# Patient Record
Sex: Male | Born: 1950 | Race: White | Hispanic: No | Marital: Married | Smoking: Former smoker
Health system: Southern US, Community
[De-identification: ages and names within clinical notes are randomized; demographics above are authoritative.]

## PROBLEM LIST (undated history)

## (undated) DIAGNOSIS — Z87442 Personal history of urinary calculi: Secondary | ICD-10-CM

## (undated) DIAGNOSIS — G4700 Insomnia, unspecified: Secondary | ICD-10-CM

## (undated) DIAGNOSIS — M549 Dorsalgia, unspecified: Secondary | ICD-10-CM

## (undated) DIAGNOSIS — I10 Essential (primary) hypertension: Secondary | ICD-10-CM

## (undated) DIAGNOSIS — C439 Malignant melanoma of skin, unspecified: Secondary | ICD-10-CM

## (undated) DIAGNOSIS — M1990 Unspecified osteoarthritis, unspecified site: Secondary | ICD-10-CM

## (undated) HISTORY — PX: EXCISION OF BACK LESION: SHX6597

## (undated) HISTORY — PX: MELANOMA EXCISION: SHX5266

## (undated) HISTORY — PX: KNEE ARTHROSCOPY: SUR90

---

## 1999-11-21 ENCOUNTER — Encounter: Payer: Self-pay | Admitting: *Deleted

## 1999-11-21 ENCOUNTER — Emergency Department (HOSPITAL_COMMUNITY): Admission: EM | Admit: 1999-11-21 | Discharge: 1999-11-21 | Payer: Self-pay | Admitting: Emergency Medicine

## 1999-11-21 ENCOUNTER — Encounter: Payer: Self-pay | Admitting: Emergency Medicine

## 1999-11-22 ENCOUNTER — Ambulatory Visit (HOSPITAL_COMMUNITY): Admission: RE | Admit: 1999-11-22 | Discharge: 1999-11-22 | Payer: Self-pay | Admitting: *Deleted

## 1999-11-22 ENCOUNTER — Encounter: Payer: Self-pay | Admitting: *Deleted

## 1999-11-24 ENCOUNTER — Ambulatory Visit (HOSPITAL_COMMUNITY): Admission: RE | Admit: 1999-11-24 | Discharge: 1999-11-24 | Payer: Self-pay | Admitting: *Deleted

## 1999-11-24 ENCOUNTER — Encounter: Payer: Self-pay | Admitting: *Deleted

## 2001-03-22 ENCOUNTER — Encounter: Admission: RE | Admit: 2001-03-22 | Discharge: 2001-03-22 | Payer: Self-pay | Admitting: Family Medicine

## 2001-03-22 ENCOUNTER — Encounter: Payer: Self-pay | Admitting: Family Medicine

## 2002-05-20 ENCOUNTER — Encounter: Admission: RE | Admit: 2002-05-20 | Discharge: 2002-05-20 | Payer: Self-pay | Admitting: Family Medicine

## 2002-05-20 ENCOUNTER — Encounter: Payer: Self-pay | Admitting: Family Medicine

## 2002-12-30 ENCOUNTER — Encounter: Admission: RE | Admit: 2002-12-30 | Discharge: 2002-12-30 | Payer: Self-pay | Admitting: Family Medicine

## 2005-01-09 ENCOUNTER — Encounter: Admission: RE | Admit: 2005-01-09 | Discharge: 2005-01-09 | Payer: Self-pay | Admitting: Family Medicine

## 2005-02-13 HISTORY — PX: TOTAL HIP ARTHROPLASTY: SHX124

## 2005-10-11 ENCOUNTER — Inpatient Hospital Stay (HOSPITAL_COMMUNITY): Admission: RE | Admit: 2005-10-11 | Discharge: 2005-10-14 | Payer: Self-pay | Admitting: Orthopedic Surgery

## 2007-03-22 ENCOUNTER — Encounter: Admission: RE | Admit: 2007-03-22 | Discharge: 2007-03-22 | Payer: Self-pay | Admitting: Family Medicine

## 2007-05-25 ENCOUNTER — Ambulatory Visit: Payer: Self-pay | Admitting: Internal Medicine

## 2007-05-25 ENCOUNTER — Observation Stay (HOSPITAL_COMMUNITY): Admission: EM | Admit: 2007-05-25 | Discharge: 2007-05-26 | Payer: Self-pay | Admitting: Emergency Medicine

## 2008-02-14 DIAGNOSIS — C439 Malignant melanoma of skin, unspecified: Secondary | ICD-10-CM

## 2008-02-14 HISTORY — DX: Malignant melanoma of skin, unspecified: C43.9

## 2010-06-28 NOTE — H&P (Signed)
Jon Choi, Jon Choi NO.:  [id number]   MEDICAL RECORD NO.:  [id number]          PATIENT TYPE:  INP   LOCATION:  1823                         FACILITY:  [facility]   PHYSICIAN:  Donalynn Furlong, MD      DATE OF BIRTH:  01/29/1951   DATE OF ADMISSION:  05/25/2007  DATE OF DISCHARGE:                              HISTORY & PHYSICAL   ADMITTING PHYSICIAN:  Hollice Espy, M.D.   PRIMARY CARE PHYSICIAN:  Donia Guiles, M.D. with [facility].   CHIEF COMPLAINT:  Chest pain, shortness of breath.   HISTORY OF PRESENT ILLNESS:  Jon Choi is a 60 year old male who  presented to [facility] ER tonight with the complaint of chest pain.  Chest pain is pressure like on the left side of the chest overlying the  heart.  It is persistent and it is present for 2 days.  Chest pressure  increases and decreases in intensity but it is there for 2 days.  Chest  pressure comes on at rest and on exertion both.  Chest pressure is  associated with mild shortness of breath also.  No associated nausea,  vomiting, perspiration, increased heart rate or feeling of death.  The  patient denies any history of coronary artery disease, gastroesophageal  reflux disease or gallstones in the past.  He denied any history of  pneumonia, sick contacts or clots in the lung also.  He does have some  premature coronary artery disease in the family history.  Chest pain  does not radiate to any other place.   PAST MEDICAL HISTORY:  Tobacco use, right total hip arthroplasty,  history of left-sided ureteral stone and knee surgery in the past.   PAST SURGICAL HISTORY:  As above in the medical history.   ALLERGIES:  None.   HOME MEDICATIONS:  He takes Tylenol for sleep at night.   REVIEW OF SYSTEMS:  Positive per HPI, otherwise negative.  Review of  systems done for 14 systems.   SOCIAL HISTORY:  The patient is married, lives with his wife, continues  to smoke cigarettes.  No recent alcohol or drug use.   FAMILY HISTORY:  Mother is old, around 66 years old.  Father died of  pulmonary embolism at a young age.  His brother had his first heart  attack at the age of 39.   PHYSICAL EXAMINATION:  VITAL SIGNS:  Blood pressure 147/98, pulse 71,  respirations 18, temperature 98.4, oxygen saturation 99%.  GENERAL:  Alert, oriented x3, lying in bed in mild distress.  CARDIOVASCULAR:  S1, S2 regular.  No murmur, rub, gallop.  LUNGS:  Clear to auscultation bilaterally.  No wheezing, rales or  crackles  ABDOMEN:  Nontender, nondistended.  Bowel sounds present.  EXTREMITIES:  No clubbing, cyanosis or edema.  No calf tenderness.  Pulses palpable in all 4 extremities.  HEAD:  Normocephalic, nontraumatic.  EYES:  Pupils equally reactive to light and accommodation.  NECK:  Normocephalic, atraumatic.  ORAL CAVITY:  Oral mucosa moist.  No thrush noted.  NECK:  No thyromegaly or JVD.  SKIN:  No rash or  bruits.  NEUROLOGICAL:  Shows intact cranial nerves, muscle strength and  sensation.   LAB:  Shows EKG with rate of 70, normal sinus rhythm, normal axis,  normal ST-T changes.  BNP less than 30, D-dimer 0.45, basic metabolic  panel unremarkable except glucose 147.  CBC unremarkable.  First set of  troponin negative and chest x-ray unremarkable for any acute lung  disease.   ASSESSMENT:  1. Chest pain, atypical in nature, associated with shortness of      breath.  Will rule out myocardial infarction and pulmonary embolus.  2. Hypertension, uncontrolled.  3. Family history of coronary artery disease.  4. Continuous tobacco use.  5. History of left ureteral stone.  6. History of right total hip arthroplasty.   PLAN:  Will admit the patient to telemetry bed with the diagnosis of  chest pain and shortness of breath.  The patient is full code.  The  patient will be n.p.o. after midnight for stress test and lipid profile  in the morning.  Will check CBC, CMP, EKG, TSH and fasting lipids in the  morning.  We  will get cardiac enzymes with troponin q.6 h. for 2 more  times.  Will give him aspirin 325 mg p.o. daily along with Lovenox 1  mg/kg subcutaneously q.12 h. until the MI rule out.  We will also start  him on metoprolol 25 mg p.o. b.i.d. for his hypertension.  He will also  get lisinopril 20 mg, simvastatin 20 mg and nitro patch 1 inch q.6 h.  for chest pain.  We will also start him on IV morphine and sublingual  nitro p.r.n. for pain.  We will also start him on IV Protonix for GI  prophylaxis.  He will also get Tylenol for any pain or fever p.r.n.  We  will get CT chest angiogram to rule out pulmonary embolus tonight.  We  will get exercise Cardiolite stress test in the morning after 3  troponins are negative.  Further plan according to the workup pending.      Donalynn Furlong, MD  Electronically Signed     TVP/MEDQ  D:  05/25/2007  T:  05/25/2007  Job:  [id number]   cc:   Donia Guiles, M.D.  Hollice Espy, M.D.

## 2010-06-28 NOTE — Consult Note (Signed)
Jon, Choi               ACCOUNT NO.:  [id number]   MEDICAL RECORD NO.:  [id number]          PATIENT TYPE:  INP   LOCATION:  3739                         FACILITY:  [facility]   PHYSICIAN:  Jake Bathe, MD      DATE OF BIRTH:  [birth year]   DATE OF CONSULTATION:  DATE OF DISCHARGE:  05/26/2007                                 CONSULTATION   REQUESTING PHYSICIAN:  Hollice Espy, M.D.   PRIMARY PHYSICIAN:  Donia Guiles, M.D.   REASON FOR CONSULTATION:  Mr. Lofgren is being seen at the request of Dr.  Rito Ehrlich for the evaluation of chest pain.   HISTORY OF PRESENT ILLNESS:  A 60 year old male with no prior cardiac  history with recent tobacco cessation 5 weeks ago after extensive  history as well as early family history of coronary artery disease with  his father having his first myocardial infarction at age 41 and his  brother in his 23s having stent placement, who 2 days ago, on Friday,  began having substernal chest pain/shortness of breath, mostly  categorized as short and strong, off and on x2 days.  It was mild and  sharp, would come and go with no radiation into his neck or arms.  He  had no associated nausea or diaphoresis.  Pain never quite went away and  has been there for approximately 2 days.  No significant change with  exertion.  No syncope.  No fevers.  He has had recent respiratory  illness/URI.  Currently, chest pain free prior to nuclear stress test.   PAST MEDICAL HISTORY:  1. Total hip replacement.  2. Knee surgery x2.  3. Nephrolithiasis, history of left-sided ureteral stones.   PAST SURGICAL HISTORY:  As above.   ALLERGIES:  No known drug allergies.   MEDICATIONS:  Occasional Tylenol PM p.r.n.   FAMILY HISTORY:  As above.  Father MI at age 57, brother with stent.   SOCIAL HISTORY:  Works as a Advice worker for [employer].  No alcohol, just  quit tobacco 5 weeks ago after extensive use.   REVIEW OF SYSTEMS:  Unless explained above, all other 12  review of  systems negative.   PHYSICAL EXAMINATION:  VITAL SIGNS:  Blood pressure currently 113/62,  pulse in the 60s, respirations 18, and afebrile.  GENERAL:  Alert and oriented x3 in no acute distress, pleasant.  EYES:  Well-perfused conjunctivae.  EOMI.  No scleral icterus.  NECK:  Supple.  No lymphadenopathy.  No carotid bruits.  No JVD.  CARDIOVASCULAR:  Regular rate and rhythm with no murmurs, rubs, or  gallops.  LUNGS:  Clear to auscultation bilaterally.  Normal respiratory effort.  ABDOMEN:  Soft, nontender, and normoactive bowel sounds.  No bruits.  EXTREMITIES:  No clubbing, cyanosis, or edema.  Normal distal pulses.  SKIN:  Warm, dry, and intact.  No rashes.  NEUROLOGIC:  Nonfocal.  Normal gait.   DATA:  EKG shows normal sinus rhythm rate 70 with no other changes.  When compared to prior ECG, there is no significant change.  CT of chest  was negative for  pulmonary embolism showed some mild reactive lymph  nodes in the hilar region.  Chest x-ray showed no acute airspace  disease.  D-dimer was 0.45.  BNP was 30.  Total cholesterol 157, HDL 15,  triglycerides 732, and LDL unable to calculate.  CK 328, MB 5.1, and  troponin 0.01.  All 3 sets were negative.  White count 7.6, hemoglobin  14, hematocrit 40, and platelets 292.  Potassium 3.6, creatinine 0.9,  and glucose 101.   ASSESSMENT AND PLAN:  A 60 year old male with recent tobacco abuse,  strong family history of coronary artery disease early with  hypertriglyceridemia and low HDL with atypical chest pain.  1. Atypical chest pain - await results of imaging for nuclear stress      test.  He exercised 8 minutes 30 seconds on the exercise treadmill      with no ST deviations, no symptoms, he did well.  Likely,      noncardiac etiology.  However, given his multiple risk factors, I      agree with the stress test for further risk stratification.  2. Hypertriglyceridemia/low HDL - I have given him a prescription for       Niaspan 500 mg once a day to be uptitrated as an outpatient.  Liver      enzymes were within normal limits.  I have also told him to start      taking fish oil 1000 mg twice daily to lower triglycerides.  Unable      to calculate LDL currently, because of elevated triglycerides.  We      will see as outpatient and titrate meds accordingly.  3. Prevention - we will start on aspirin 81 mg once a day.  4. Tobacco cessation - congratulated him on 5 weeks of no smoking.      When he arrived in the emergency department, his blood pressure was      147/98, then 143/76.  He was placed on low-dose lisinopril 5 mg as      well as metoprolol 25 mg twice daily.  One may wish to continue      these medicines as seems fit.  We will see as outpatient in 2      weeks.  We will follow up with the results of imaging stress test.      Jake Bathe, MD  Electronically Signed     MCS/MEDQ  D:  05/26/2007  T:  05/26/2007  Job:  [id number]   cc:   Hollice Espy, M.D.

## 2010-07-01 NOTE — Op Note (Signed)
[facility]  Patient:    Jon Choi, Jon Choi                       MRN: [id number] Proc. Date: 11/22/99 Attending:  Radene Knee., M.D.                           Operative Report  PREOPERATIVE DIAGNOSES:  Left ureteral calculus, L3 with hydronephrosis.  POSTOPERATIVE DIAGNOSES:  Left ureteral calculus, L3 with hydronephrosis.  OPERATION PERFORMED:  Cystoscopy, insertion of left ureteral stent in preparation for a shock wave lithotripsy.  DESCRIPTION OF PROCEDURE:  This patient was brought to the operating room after receiving IV gentamycin and underwent successful induction of general anesthesia, prepped and draped in the lithotomy position. The urethra accepted a #22 cystourethroscope. The bladder was inspected with 70 and 12 degree lenses. There was no stone, tumor, nor ulcer in the bladder. The right and left ureteral orifices were normal. There was slight elevation of the bladder neck by median lip, no lateral lobe hypertrophy. The left ureteral orifice accepted an 0.38 Glidewire which passed to the region of the left renal pelvis under fluoroscopic control. The Glidewire was used to introduce a #5 open ended catheter and dye was injected which showed grade 1 hydronephrosis and looked like the stone had probably been pushed back into the renal pelvis from the upper ureter. The Glidewire was replaced and over the Glidewire was passed a #6 multi-length kwart type double J ureteral stone was noted to have a good curl in the renal pelvis and a good curl in the bladder on fluoroscopy. The stent string was taped to the penis. The patient returned to the recovery area in a stable condition.  The plan is for the patient to have Mepergan Fortis for pain, forced fluids, cephalexin 500 t.i.d. for his pain. Cephalexin t.i.d. for prophylaxis against infection and will schedule him November 24, 1999 for shock wave lithotripsy. DD:  11/22/99 TD:   11/23/99 Job: [id number]

## 2010-07-01 NOTE — H&P (Signed)
[facility]  Patient:    Jon Choi, Jon Choi                       MRN: [id number] Adm. Date:  [id number] Disc. Date: [id number] Attending:  Molpus, John L                         History and Physical  DATE OF BIRTH:  Mar 29, 1950.  CHIEF COMPLAINT:  This patient, age 60, was initially seen in our office on November 21, 1999, referred from the [facility] Emergency Room with a CT scan that suggested at 5 or 6 mm calculus, upper ureter, on the left, with a chief complaint of pain in the left side.  HISTORY OF PRESENT ILLNESS:  This patient states that he had his first pain November 20, 1999, which was severe in the left side associated with vomiting and presented in the emergency room, where CT scan suggested a stone obstructing the upper left ureter.  He reported no nocturia.  He voids every couple of hours.  He has not passed gross blood, gravel, or stone to his knowledge.  He did notice dark urine about two weeks ago.  He has not had fever, but he has had vomiting.  ALLERGIES:  None.  MEDICATIONS:  Aspirin.  He was given Percodan in the emergency room.  He used also Percocet, Demerol, and _____.  PAST MEDICAL HISTORY:  Unremarkable.  PAST SURGICAL HISTORY:  He had knee surgery on one side in 1988, on the other side in 1983.  No other surgeries.  REVIEW OF SYSTEMS:  Weight has been steady, about 200.  HEENT:  Unremarkable. Wears glasses.  CARDIORESPIRATORY:  He denies any chest pain, heart attack, shortness of breath.  GASTROINTESTINAL:  No peptic ulcer disease, no hepatitis, bowels move normally.  He has indigestion, for which he uses Tums. BONES, JOINTS, MUSCLES:  Has no arthritis.  NEUROPSYCHIATRIC:  Denies any stroke, fainting, falling-out spells.  FAMILY HISTORY:  Father died at age 76 of a pulmonary embolus from a clot in his leg, and his mother is living at age 17 in reasonably good health.  He has two siblings.  One brother has had a myocardial  infarction at age 77.  The sister is living and well at age 39.  To his knowledge, other than heart disease there are no familial diseases, especially no stones, diabetes, or bleeders.  SOCIAL HISTORY:  The patient is single, working.  Has two children.  Smokes one pack of cigarettes daily.  Does not use alcohol.  PHYSICAL EXAMINATION:  GENERAL:  A well-developed, well-nourished 60 year old male.  VITAL SIGNS:  Temperature is 98, pulse is 83, the respirations 16, blood pressure 136/83.  HEENT:  Ears:  Tympanic membranes unremarkable.  Eyes react normally to light and accommodation.  Extraocular movements intact.  Pharynx benign.  Teeth in good repair.  NECK:  No enlargement of thyroid or nodes palpable.  CHEST:  Clear to percussion and auscultation.  HEART:  Normal sinus rhythm, no murmur, not enlarged.  ABDOMEN:  There is a little left flank tenderness but no masses palpable.  GENITOURINARY:  The penis is circumcised.  The testes are symmetrical, good size.  Rectal tone is good.  Prostate is 15-20 g, symmetrical, firm, bifid, smooth, nontender.  EXTREMITIES:  No edema.  Good peripheral pulses.  NEUROLOGIC:  Grossly normal reflexes and sensation.  IMPRESSION: 1. Left upper ureter ureteral calculus. 2.  Left hydronephrosis.  PLAN: 1. IVP, cystoscopy, stent, on November 22, 1999. 2. ESWL on November 24, 1999. DD:  11/21/99 TD:  11/22/99 Job: [id number]

## 2010-07-01 NOTE — Discharge Summary (Signed)
Jon Choi, Jon Choi               ACCOUNT NO.:  [id number]   MEDICAL RECORD NO.:  [id number]          PATIENT TYPE:  INP   LOCATION:  1511                         FACILITY:  [facility]   PHYSICIAN:  Georges Lynch. Gioffre, M.D.DATE OF BIRTH:  Sep 20, 1950   DATE OF ADMISSION:  10/11/2005  DATE OF DISCHARGE:  10/14/2005                                 DISCHARGE SUMMARY   ADMISSION DIAGNOSES:  1. End-state osteoarthritis, right hip, bone on bone.  2. History of kidney stones.  3. History of tobacco use.   DISCHARGE DIAGNOSES:  1. Right total hip arthroplasty.  2. History of kidney stones.  3. History of tobacco use.   HISTORY OF PRESENT ILLNESS:  The patient is a 59 year old gentleman with  problems with his right hip now for in excess of a year.  Evaluation found  that he had severe osteoarthritis with complete loss of joint space and  femoral head.  The patient has failed conservative treatments and elected to  proceed with total hip replacement.   ALLERGIES:  No known drug allergies.   MEDICATIONS:  1. Keflex 500 mg four times a day for recent surgery due to a crown.  2. Vicodin p.r.n.   PROCEDURE:  On October 11, 2005, the patient was taken to the OR by Dr.  Ranee Gosselin, assisted by Dr. Durene Romans.  Under general anesthesia, the  patient underwent a right total hip arthroplasty with a metal on metal  component.  The patient had the femoral head sent to pathology for  evaluation.  The patient had estimated blood loss of 225 mL.  There were no  complications.  The patient had the following components implanted:  A size  6, high-offset tapered stem Duoflex; a size 49 ASR unifemoral implant; a  size +2 ASR tapered sleeve adapter; a size 56 ASR cup.  The patient  tolerated the procedure well.  He was transferred to the recovery room and  then to orthopedic floor in good condition following routine total hip  protocol.   CONSULTATIONS:  1. Physical therapy.  2. Occupational  therapy.  3. Case management.  4. Pharmacy for DVT prophylaxis.   HOSPITAL COURSE:  On October 11, 2005, the patient was admitted to [facility] under the care of Dr. Windy Fast A. Gioffre.  The patient was  taken to the OR where a right total hip arthroplasty was performed without  any complications.  The patient tolerated the procedure well and was  transferred to the recovery room and then to the orthopedic floor on IV pain  medicines and antibiotics and was started on Coumadin and heparin for DVT  prophylaxis and total hip protocol.  The patient then incurred a total of 3  days postoperative course on the orthopedic floor which the patient was able  to transition from IV medications to p.o. medications well.  The patient's  vital signs remained stable.  He remained afebrile.  His hemoglobin and  hematocrit remained fairly stable.  The patient's wound remained benign for  any signs of infection.  His leg remained neurovascularly intact.  The  patient had good pain control.  The patient worked well with physical  therapy.  The patient was felt to be both medically and orthopedically  stable on postop day #3 for discharge home for routine outpatient protocol  for total hip.  Arrangements were made and he was discharged home in good  condition.   LABORATORY DATA AND X-RAY FINDINGS:  CBC on September 1, showed WBC 11.0,  hemoglobin 11.2, hematocrit 31.8, platelets 219.  INR on September 1, was  2.0 on Coumadin.  Routine chemistries on September 1, showed sodium 134,  potassium 3.4, glucose 117, BUN 6, creatinine 0.87.  These slightly  abnormalities were felt to be related to the patient's inactivity, diet and  IV hydration status and were allowed to self-correct without any further  treatments.  The patient's EKG on admission was normal sinus rhythm at 67  beats per minute.  Chest x-ray on admission showed no active cardiopulmonary  process demonstrated.  Postop film of the hip  shows a well-seeded right hip  prosthesis.   DISCHARGE MEDICATIONS:  1. Routine Coumadin protocol being managed by pharmacy.  2. Heparin 5000 units subcu q.12h. until therapeutic and it was      discontinued on the date of discharge.  3. Colace 100 mg p.o. b.i.d.  4. Ferrous sulfate 325 mg p.o. t.i.d.  5. Nicotine 21 mg per 24 hour patch.  6. Percocet one to two tablets every 4-6h. p.r.n. pain.  7. Reglan 10 mg p.o. q.8h. p.r.n.  8. Phenergan 25 mg p.o. q.8h. p.r.n.  9. Robaxin 500 mg p.o. q.6h. p.r.n.  10.Ambien 10 mg p.o. nightly p.r.n.   DIET:  No restrictions.   ACTIVITY:  The patient is to ambulate with assistance and closely follow  total hip protocol with the use of a walker.   WOUND CARE:  The patient is to change dressing daily.   SPECIAL INSTRUCTIONS:  Continue routine home medications with the exception  of discontinuing the Vicodin and use Percocet as written.  Also, to start  Coumadin 5 mg tablets take one-half tablet daily unless changed by home  health pharmacy.   FOLLOW UP:  The patient is to have a follow-up appointment with Dr. Darrelyn Hillock  in his office 2 weeks from discharge.  [facility] home health physical therapy  and RN to provide service for total hip protocol.   CONDITION ON DISCHARGE:  Improved and good.      Jamelle Rushing, P.A.    ______________________________  Georges Lynch Darrelyn Hillock, M.D.    RWK/MEDQ  D:  11/01/2005  T:  11/02/2005  Job:  [id number]

## 2010-07-01 NOTE — H&P (Signed)
NAMEJEMEL, ONO               ACCOUNT NO.:  [id number]   MEDICAL RECORD NO.:  [id number]          PATIENT TYPE:   LOCATION:                                 FACILITY:   PHYSICIAN:  Jon Choi. Gioffre, M.D.DATE OF BIRTH:  07/19/1950   DATE OF ADMISSION:  10/11/2005  DATE OF DISCHARGE:                                HISTORY & PHYSICAL   CHIEF COMPLAINT:  Right hip pain.   HISTORY OF PRESENT ILLNESS:  Jon Choi is a 60 year old gentleman who has  been having problems with his right hip now for the last year or so.  He had  fallen previously in December 2006.  He did have some arthritic changes at  that time with loss of joint space and cystic changes.  He has been  tolerating it up to this point but presently early August evaluation found  that he had severe constant pain with loss or range of motion of his right  hip and would like to discuss surgical treatment.  He does have severe  degenerative changes of the hip with loss of joint space, and the patient  would like to proceed with a total hip arthroplasty.  He does have pain with  ambulation, range of motion of the hip.   ALLERGIES:  NO KNOWN DRUG ALLERGIES.   CURRENT MEDICATIONS:  1. Keflex 500 q.i.d. due to recent oral surgery with a crown.  2. Vicodin p.r.n.   PREMEDICATION:  1. Current medical issues include a childhood heart murmur with no      recurrent issues.  2. History of kidney stones, last being 3 or 4 years ago.  3. He does have bilateral knee open meniscal repairs in 1984.  4. Current treatment with oral crown work.   PAST SURGICAL HISTORY:  Bilateral knees in the 70s without any  complications.   SOCIAL HISTORY:  Patient is married, lives with his wife, is currently  unemployed.  Smokes about one pack a day. Currently lives in a one-story  house with his wife.   FAMILY MEDICAL HISTORY:  Mother is 54 years old on oxygen.  Father is  deceased many years ago from a blood clot.  Older brother does have  coronary  artery disease with a stent.   PRIMARY CARE PHYSICIAN:  Dr. Arvilla Market.   REVIEW:  Unremarkable for any cardiac, respiratory, GI, GU, hematologic or  neurologic issues.   PHYSICAL EXAMINATION:  VITALS:  Height is 62.  Weight is 197 pounds.  Blood  pressure is 118/80, pulse of 74 and regular, respirations of 12.  The  patient is afebrile.  GENERAL:  This is a healthy-appearing, slim, physically-fit-appearing  gentleman conscious, alert and appropriate.  Ambulates with a right-sided  limp.  Easily gets himself on and off the exam table.  HEENT:  Head was normocephalic.  Pupils equal, round and reactive.  Oral  buccal mucosa without any signs of infection.  NECK:  Supple.  No palpable lymphadenopathy.  Good range of motion.  CHEST:  Lung sounds were clear and equal bilaterally.  No wheezes, rales or  rhonchi.  HEART:  Regular rate and rhythm.  No murmurs, rubs or gallops.  ABDOMEN:  Soft, nontender.  Bowel sounds present.  No CVA region tenderness.  EXTREMITIES:  Upper extremities were symmetrically sized and shaped.  He had  excellent range of motion of shoulders, elbows, and wrist. Motor strength  was 5/5.  Lower extremities:  Right hip had full extension, flexion up to  about 100 degrees, had about 5 degrees of internal and external rotation due  to mechanical blocking and discomfort.  Left hip had full extension with  flexion up to 120 degrees with 20 to 30 degrees internal-external rotation  with discomfort bilateral knees without any signs of erythema or ecchymosis.  He had well-healed anterior incisions, full extension, flexion back to 100  degrees easily without any discomfort or instability.  Calves were soft and  nontender.  The ankles were symmetrical with good dorsi plantar flexion.  NEUROLOGIC:  The patient had no deficits.  BREAST, RECTAL AND GENITOURINARY:  Exams were deferred.   IMPRESSION:  1. 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  2. History  of cardiac murmur as a child.  3. History of kidney stones  4. Recent oral surgery with crown on antibiotics prophylactically.   PLAN:  The patient will be scheduled for a right total hip arthroplasty by  Dr. Darrelyn Hillock on October 11, 2005.  The patient will undergo all routine labs  and tests prior to this procedure.      Jon Choi, P.A.    ______________________________  Jon Choi Darrelyn Hillock, M.D.    RWK/MEDQ  D:  10/02/2005  T:  10/02/2005  Job:  [id number]   cc:   Windy Fast A. Darrelyn Hillock, M.D.  Fax: [phone]

## 2010-07-01 NOTE — Op Note (Signed)
NAMEMACKAY, Jon Choi               ACCOUNT NO.:  [id number]   MEDICAL RECORD NO.:  [id number]          PATIENT TYPE:  INP   LOCATION:  0003                         FACILITY:  [facility]   PHYSICIAN:  Georges Lynch. Gioffre, M.D.DATE OF BIRTH:  December 20, 1950   DATE OF PROCEDURE:  10/11/2005  DATE OF DISCHARGE:                                 OPERATIVE REPORT   SURGEON:  Georges Lynch. Darrelyn Hillock, M.D.   ASSISTANT:  Madlyn Frankel. Charlann Boxer, M.D.   PREOP DIAGNOSIS:  Severe degenerative arthritis right hip.   POSTOP DIAGNOSIS:  Severe degenerative arthritis right hip.   OPERATION:  Right total hip arthroplasty.  I utilized the [employer] system.  We  used an ASR acetabular cup size 56 mm diameter.  There was Porocoated and  hydroxyapatite coated.  The stem was a size 6 high offset tapered stem.  The  femoral head was a size 49.  We used a +2 tapered sleeve adapter.   DESCRIPTION OF PROCEDURE:  Under general anesthesia with the patient on the  left side and the right side up, routine orthopedic prep and draping of the  right hip was carried out.  He had 1 gram of IV Ancef preop.  A  posterolateral approach to the hip was carried out.  Bleeders were  identified and cauterized.  At this time, self-retaining retractors were  inserted.  I separated the iliotibial band by sharp dissection; and then  separated the gluteal muscle by blunt dissection with great care taken not  to injure the long sciatic nerve.  The appropriate bleeders were cauterized.  At this time, I detached the capsule from the femoral head and did a  capsulectomy.  I then dislocated the head; and then amputated the head at  the appropriate neck length.  I then utilized a box cut to cut out my  femoral neck.  I then utilized the lateralizing reamers.   I then reamed and rasped the femoral shaft up to a size 6 femoral component.  Following that, we then utilized a calcar planer to even out the femoral  neck.  We then directed attention to the acetabulum.   I completed my  capsulectomy.  I then reamed the acetabulum up to a size 55 mm for a 56 mm  cup.  We then inserted our permanent ASR cup.  We had good fixation.  I then  went through trials; and, again, we finally selected a +2 C-tapered head  with a 49 ball.  The femoral component and the permanent femoral component  was inserted.  I then inserted my permanent +2 sleeve adapter and then the  size 49 mm metal ball.  So we had metal-on-metal total hip.  I thoroughly  irrigated out the acetabulum to make sure that all the soft tissue was out  of harms way, then reduced hip, took the hip through motion.  We had  excellent stability.  Note at all times during the trial and post trial, we  checked our leg lengths to make sure we had appropriate leg length  measurements.  I then inserted a Hemovac drain; and  then closed the wound in  layers in the usual fashion.  A sterile Neosporin dressing was applied. The  patient left the operating room in satisfactory condition.           ______________________________  Georges Lynch Darrelyn Hillock, M.D.    RAG/MEDQ  D:  10/11/2005  T:  10/12/2005  Job:  [id number]

## 2010-11-08 LAB — URINALYSIS, ROUTINE W REFLEX MICROSCOPIC
Bilirubin Urine: NEGATIVE
Glucose, UA: NEGATIVE
Hgb urine dipstick: NEGATIVE
Ketones, ur: NEGATIVE
Nitrite: NEGATIVE
Protein, ur: NEGATIVE
Specific Gravity, Urine: 1.006
Urobilinogen, UA: 0.2
pH: 7.5

## 2010-11-08 LAB — CBC
HCT: 41.4
Hemoglobin: 14.4
MCHC: 34.7
MCHC: 35.4
MCV: 94.9
MCV: 95
Platelets: 292
RBC: 4.36
RDW: 12.3

## 2010-11-08 LAB — COMPREHENSIVE METABOLIC PANEL
AST: 32
Albumin: 3.8
BUN: 9
CO2: 21
Calcium: 8.8
Chloride: 109
Creatinine, Ser: 0.94
GFR calc Af Amer: >60
GFR calc non Af Amer: >60
Total Bilirubin: 0.4

## 2010-11-08 LAB — CARDIAC PANEL(CRET KIN+CKTOT+MB+TROPI)
CK, MB: 5.1 — ABNORMAL HIGH
CK, MB: 5.3 — ABNORMAL HIGH
Relative Index: 1.6
Total CK: 314 — ABNORMAL HIGH
Total CK: 328 — ABNORMAL HIGH
Troponin I: 0.01
Troponin I: 0.01

## 2010-11-08 LAB — POCT CARDIAC MARKERS
CKMB, poc: 3.2
Myoglobin, poc: 182
Operator id: 196461
Troponin i, poc: <0.05

## 2010-11-08 LAB — BASIC METABOLIC PANEL
CO2: 22
Calcium: 8.8
Chloride: 106
GFR calc Af Amer: >60
Glucose, Bld: 147 — ABNORMAL HIGH
Potassium: 3.8
Sodium: 137

## 2010-11-08 LAB — CK TOTAL AND CKMB (NOT AT ARMC)
Relative Index: 1.7
Total CK: 324 — ABNORMAL HIGH

## 2010-11-08 LAB — TSH: TSH: 3.841

## 2010-11-08 LAB — LIPID PANEL: Cholesterol: 157

## 2013-07-01 DIAGNOSIS — M25561 Pain in right knee: Secondary | ICD-10-CM | POA: Insufficient documentation

## 2013-07-01 DIAGNOSIS — M25569 Pain in unspecified knee: Secondary | ICD-10-CM | POA: Insufficient documentation

## 2015-05-31 ENCOUNTER — Other Ambulatory Visit: Payer: Self-pay | Admitting: Orthopaedic Surgery

## 2015-05-31 DIAGNOSIS — M545 Low back pain: Secondary | ICD-10-CM | POA: Diagnosis not present

## 2015-05-31 DIAGNOSIS — M25551 Pain in right hip: Secondary | ICD-10-CM | POA: Diagnosis not present

## 2015-06-08 ENCOUNTER — Ambulatory Visit
Admission: RE | Admit: 2015-06-08 | Discharge: 2015-06-08 | Disposition: A | Discharge status: Home / Self Care | Payer: BLUE CROSS/BLUE SHIELD | Source: Ambulatory Visit | Attending: Orthopaedic Surgery | Admitting: Orthopaedic Surgery

## 2015-06-08 DIAGNOSIS — M545 Low back pain: Secondary | ICD-10-CM

## 2015-06-08 DIAGNOSIS — M4806 Spinal stenosis, lumbar region: Secondary | ICD-10-CM | POA: Diagnosis not present

## 2015-06-16 DIAGNOSIS — M545 Low back pain: Secondary | ICD-10-CM | POA: Diagnosis not present

## 2015-06-17 ENCOUNTER — Other Ambulatory Visit: Payer: Self-pay | Admitting: Orthopaedic Surgery

## 2015-06-17 DIAGNOSIS — M545 Low back pain: Principal | ICD-10-CM

## 2015-06-17 DIAGNOSIS — G8929 Other chronic pain: Secondary | ICD-10-CM

## 2015-06-22 ENCOUNTER — Other Ambulatory Visit: Payer: Self-pay

## 2015-06-22 ENCOUNTER — Ambulatory Visit
Admission: RE | Admit: 2015-06-22 | Discharge: 2015-06-22 | Disposition: A | Discharge status: Home / Self Care | Payer: BLUE CROSS/BLUE SHIELD | Source: Ambulatory Visit | Attending: Orthopaedic Surgery | Admitting: Orthopaedic Surgery

## 2015-06-22 DIAGNOSIS — M4806 Spinal stenosis, lumbar region: Secondary | ICD-10-CM | POA: Diagnosis not present

## 2015-06-22 DIAGNOSIS — E785 Hyperlipidemia, unspecified: Secondary | ICD-10-CM | POA: Insufficient documentation

## 2015-06-22 DIAGNOSIS — I10 Essential (primary) hypertension: Secondary | ICD-10-CM | POA: Insufficient documentation

## 2015-06-22 DIAGNOSIS — C439 Malignant melanoma of skin, unspecified: Secondary | ICD-10-CM | POA: Insufficient documentation

## 2015-06-22 DIAGNOSIS — G8929 Other chronic pain: Secondary | ICD-10-CM

## 2015-06-22 DIAGNOSIS — M545 Low back pain: Principal | ICD-10-CM

## 2015-06-22 MED ORDER — IOPAMIDOL (ISOVUE-M 200) INJECTION 41%
1.0000 mL | Freq: Once | INTRAMUSCULAR | Status: AC
  Administered 2015-06-22: 1 mL via EPIDURAL

## 2015-06-22 MED ORDER — METHYLPREDNISOLONE ACETATE 40 MG/ML INJ SUSP (RADIOLOG
120.0000 mg | Freq: Once | INTRAMUSCULAR | Status: AC
  Administered 2015-06-22: 120 mg via EPIDURAL

## 2015-06-22 NOTE — Discharge Instructions (Signed)

## 2015-07-19 DIAGNOSIS — M545 Low back pain: Secondary | ICD-10-CM | POA: Diagnosis not present

## 2015-10-20 ENCOUNTER — Other Ambulatory Visit: Payer: Self-pay | Admitting: Orthopaedic Surgery

## 2015-10-20 DIAGNOSIS — G8929 Other chronic pain: Secondary | ICD-10-CM

## 2015-10-20 DIAGNOSIS — M545 Low back pain: Principal | ICD-10-CM

## 2015-10-28 ENCOUNTER — Ambulatory Visit
Admission: RE | Admit: 2015-10-28 | Discharge: 2015-10-28 | Disposition: A | Discharge status: Home / Self Care | Payer: BLUE CROSS/BLUE SHIELD | Source: Ambulatory Visit | Attending: Orthopaedic Surgery | Admitting: Orthopaedic Surgery

## 2015-10-28 DIAGNOSIS — G8929 Other chronic pain: Secondary | ICD-10-CM

## 2015-10-28 DIAGNOSIS — M5126 Other intervertebral disc displacement, lumbar region: Secondary | ICD-10-CM | POA: Diagnosis not present

## 2015-10-28 DIAGNOSIS — M545 Low back pain: Principal | ICD-10-CM

## 2015-10-28 MED ORDER — IOPAMIDOL (ISOVUE-M 200) INJECTION 41%
1.0000 mL | Freq: Once | INTRAMUSCULAR | Status: AC
  Administered 2015-10-28: 1 mL via EPIDURAL

## 2015-10-28 MED ORDER — METHYLPREDNISOLONE ACETATE 40 MG/ML INJ SUSP (RADIOLOG
120.0000 mg | Freq: Once | INTRAMUSCULAR | Status: AC
  Administered 2015-10-28: 120 mg via EPIDURAL

## 2015-11-16 DIAGNOSIS — Z23 Encounter for immunization: Secondary | ICD-10-CM | POA: Diagnosis not present

## 2015-11-16 DIAGNOSIS — Z0000 Encounter for general adult medical examination without abnormal findings: Secondary | ICD-10-CM | POA: Diagnosis not present

## 2015-11-16 DIAGNOSIS — Z125 Encounter for screening for malignant neoplasm of prostate: Secondary | ICD-10-CM | POA: Diagnosis not present

## 2015-11-16 DIAGNOSIS — E782 Mixed hyperlipidemia: Secondary | ICD-10-CM | POA: Diagnosis not present

## 2015-11-16 DIAGNOSIS — I10 Essential (primary) hypertension: Secondary | ICD-10-CM | POA: Diagnosis not present

## 2015-11-16 DIAGNOSIS — N529 Male erectile dysfunction, unspecified: Secondary | ICD-10-CM | POA: Diagnosis not present

## 2015-11-16 DIAGNOSIS — Z1211 Encounter for screening for malignant neoplasm of colon: Secondary | ICD-10-CM | POA: Diagnosis not present

## 2015-11-23 ENCOUNTER — Other Ambulatory Visit (INDEPENDENT_AMBULATORY_CARE_PROVIDER_SITE_OTHER): Payer: Self-pay | Admitting: Orthopaedic Surgery

## 2015-11-23 DIAGNOSIS — G8929 Other chronic pain: Secondary | ICD-10-CM

## 2015-11-23 DIAGNOSIS — M545 Low back pain: Principal | ICD-10-CM

## 2015-12-08 ENCOUNTER — Other Ambulatory Visit: Payer: Self-pay | Admitting: Neurosurgery

## 2015-12-15 ENCOUNTER — Other Ambulatory Visit: Payer: BLUE CROSS/BLUE SHIELD

## 2015-12-15 DIAGNOSIS — L821 Other seborrheic keratosis: Secondary | ICD-10-CM | POA: Diagnosis not present

## 2015-12-15 DIAGNOSIS — Z85828 Personal history of other malignant neoplasm of skin: Secondary | ICD-10-CM | POA: Diagnosis not present

## 2015-12-15 DIAGNOSIS — D0471 Carcinoma in situ of skin of right lower limb, including hip: Secondary | ICD-10-CM | POA: Diagnosis not present

## 2015-12-15 DIAGNOSIS — C44712 Basal cell carcinoma of skin of right lower limb, including hip: Secondary | ICD-10-CM | POA: Diagnosis not present

## 2015-12-15 DIAGNOSIS — D1801 Hemangioma of skin and subcutaneous tissue: Secondary | ICD-10-CM | POA: Diagnosis not present

## 2015-12-15 DIAGNOSIS — L570 Actinic keratosis: Secondary | ICD-10-CM | POA: Diagnosis not present

## 2015-12-15 DIAGNOSIS — C4441 Basal cell carcinoma of skin of scalp and neck: Secondary | ICD-10-CM | POA: Diagnosis not present

## 2016-02-02 DIAGNOSIS — K648 Other hemorrhoids: Secondary | ICD-10-CM | POA: Diagnosis not present

## 2016-02-02 DIAGNOSIS — Z1211 Encounter for screening for malignant neoplasm of colon: Secondary | ICD-10-CM | POA: Diagnosis not present

## 2016-02-04 ENCOUNTER — Ambulatory Visit
Admission: RE | Admit: 2016-02-04 | Discharge: 2016-02-04 | Disposition: A | Discharge status: Home / Self Care | Payer: BLUE CROSS/BLUE SHIELD | Source: Ambulatory Visit | Attending: Orthopaedic Surgery | Admitting: Orthopaedic Surgery

## 2016-02-04 DIAGNOSIS — M545 Low back pain: Secondary | ICD-10-CM | POA: Diagnosis not present

## 2016-02-04 DIAGNOSIS — G8929 Other chronic pain: Secondary | ICD-10-CM

## 2016-02-04 MED ORDER — METHYLPREDNISOLONE ACETATE 40 MG/ML INJ SUSP (RADIOLOG
120.0000 mg | Freq: Once | INTRAMUSCULAR | Status: AC
  Administered 2016-02-04: 120 mg via EPIDURAL

## 2016-02-04 MED ORDER — IOPAMIDOL (ISOVUE-M 200) INJECTION 41%
1.0000 mL | Freq: Once | INTRAMUSCULAR | Status: AC
  Administered 2016-02-04: 1 mL via EPIDURAL

## 2016-02-04 NOTE — Discharge Instructions (Signed)

## 2016-02-14 DIAGNOSIS — B958 Unspecified staphylococcus as the cause of diseases classified elsewhere: Secondary | ICD-10-CM

## 2016-02-14 HISTORY — DX: Unspecified staphylococcus as the cause of diseases classified elsewhere: B95.8

## 2016-05-11 ENCOUNTER — Other Ambulatory Visit (INDEPENDENT_AMBULATORY_CARE_PROVIDER_SITE_OTHER): Payer: Self-pay | Admitting: Orthopaedic Surgery

## 2016-05-11 DIAGNOSIS — G8929 Other chronic pain: Secondary | ICD-10-CM

## 2016-05-11 DIAGNOSIS — M545 Low back pain: Principal | ICD-10-CM

## 2016-05-19 ENCOUNTER — Ambulatory Visit
Admission: RE | Admit: 2016-05-19 | Discharge: 2016-05-19 | Disposition: A | Discharge status: Home / Self Care | Payer: BLUE CROSS/BLUE SHIELD | Source: Ambulatory Visit | Attending: Orthopaedic Surgery | Admitting: Orthopaedic Surgery

## 2016-05-19 DIAGNOSIS — M545 Low back pain: Principal | ICD-10-CM

## 2016-05-19 DIAGNOSIS — G8929 Other chronic pain: Secondary | ICD-10-CM

## 2016-05-19 MED ORDER — METHYLPREDNISOLONE ACETATE 40 MG/ML INJ SUSP (RADIOLOG
120.0000 mg | Freq: Once | INTRAMUSCULAR | Status: AC
  Administered 2016-05-19: 120 mg via EPIDURAL

## 2016-05-19 MED ORDER — IOPAMIDOL (ISOVUE-M 200) INJECTION 41%
1.0000 mL | Freq: Once | INTRAMUSCULAR | Status: AC
  Administered 2016-05-19: 1 mL via EPIDURAL

## 2016-07-05 ENCOUNTER — Telehealth (INDEPENDENT_AMBULATORY_CARE_PROVIDER_SITE_OTHER): Payer: Self-pay | Admitting: Orthopaedic Surgery

## 2016-07-05 ENCOUNTER — Other Ambulatory Visit (INDEPENDENT_AMBULATORY_CARE_PROVIDER_SITE_OTHER): Payer: Self-pay

## 2016-07-05 DIAGNOSIS — G8929 Other chronic pain: Secondary | ICD-10-CM

## 2016-07-05 DIAGNOSIS — M5442 Lumbago with sciatica, left side: Principal | ICD-10-CM

## 2016-07-05 NOTE — Telephone Encounter (Signed)
This ok? Patient was seen last in [facility] 07/19/15

## 2016-07-05 NOTE — Telephone Encounter (Signed)
Patient called wanting to schedule a back injection at [facility]. CB # [phone]

## 2016-07-05 NOTE — Telephone Encounter (Signed)
That will be fine. 

## 2016-07-05 NOTE — Telephone Encounter (Signed)
Sent order.

## 2016-07-06 ENCOUNTER — Other Ambulatory Visit (INDEPENDENT_AMBULATORY_CARE_PROVIDER_SITE_OTHER): Payer: Self-pay | Admitting: Orthopaedic Surgery

## 2016-07-06 DIAGNOSIS — G8929 Other chronic pain: Secondary | ICD-10-CM

## 2016-07-06 DIAGNOSIS — M5442 Lumbago with sciatica, left side: Principal | ICD-10-CM

## 2016-07-12 ENCOUNTER — Other Ambulatory Visit (INDEPENDENT_AMBULATORY_CARE_PROVIDER_SITE_OTHER): Payer: Self-pay | Admitting: Orthopaedic Surgery

## 2016-07-12 ENCOUNTER — Ambulatory Visit
Admission: RE | Admit: 2016-07-12 | Discharge: 2016-07-12 | Disposition: A | Discharge status: Home / Self Care | Payer: BLUE CROSS/BLUE SHIELD | Source: Ambulatory Visit | Attending: Orthopaedic Surgery | Admitting: Orthopaedic Surgery

## 2016-07-12 DIAGNOSIS — M5442 Lumbago with sciatica, left side: Principal | ICD-10-CM

## 2016-07-12 DIAGNOSIS — G8929 Other chronic pain: Secondary | ICD-10-CM

## 2016-07-12 DIAGNOSIS — M545 Low back pain: Secondary | ICD-10-CM | POA: Diagnosis not present

## 2016-07-12 MED ORDER — METHYLPREDNISOLONE ACETATE 40 MG/ML INJ SUSP (RADIOLOG
120.0000 mg | Freq: Once | INTRAMUSCULAR | Status: AC
  Administered 2016-07-12: 120 mg via EPIDURAL

## 2016-07-12 MED ORDER — IOPAMIDOL (ISOVUE-M 200) INJECTION 41%
1.0000 mL | Freq: Once | INTRAMUSCULAR | Status: AC
  Administered 2016-07-12: 1 mL via EPIDURAL

## 2016-07-27 ENCOUNTER — Other Ambulatory Visit (INDEPENDENT_AMBULATORY_CARE_PROVIDER_SITE_OTHER): Payer: Self-pay

## 2016-07-27 DIAGNOSIS — G8929 Other chronic pain: Secondary | ICD-10-CM

## 2016-07-27 DIAGNOSIS — M5442 Lumbago with sciatica, left side: Principal | ICD-10-CM

## 2016-08-01 ENCOUNTER — Other Ambulatory Visit (INDEPENDENT_AMBULATORY_CARE_PROVIDER_SITE_OTHER): Payer: Self-pay | Admitting: Orthopaedic Surgery

## 2016-08-01 DIAGNOSIS — G8929 Other chronic pain: Secondary | ICD-10-CM

## 2016-08-01 DIAGNOSIS — M5442 Lumbago with sciatica, left side: Principal | ICD-10-CM

## 2016-08-17 ENCOUNTER — Other Ambulatory Visit (INDEPENDENT_AMBULATORY_CARE_PROVIDER_SITE_OTHER): Payer: Self-pay | Admitting: Orthopaedic Surgery

## 2016-08-21 ENCOUNTER — Telehealth (INDEPENDENT_AMBULATORY_CARE_PROVIDER_SITE_OTHER): Payer: Self-pay | Admitting: *Deleted

## 2016-08-21 ENCOUNTER — Other Ambulatory Visit (INDEPENDENT_AMBULATORY_CARE_PROVIDER_SITE_OTHER): Payer: Self-pay

## 2016-08-21 DIAGNOSIS — G8929 Other chronic pain: Secondary | ICD-10-CM

## 2016-08-21 DIAGNOSIS — M5442 Lumbago with sciatica, left side: Principal | ICD-10-CM

## 2016-08-21 NOTE — Telephone Encounter (Signed)
I'm ok with them injecting his back in what ever area his L-spine was injected before (see in [facility])

## 2016-08-21 NOTE — Telephone Encounter (Signed)
Pt called stating he is needing a referral for an injection. He states he gets 3 and tried to make an appt at [facility] and they stated he didn't have a referral.

## 2016-08-21 NOTE — Telephone Encounter (Signed)
Order sent for ESI. 

## 2016-08-22 ENCOUNTER — Other Ambulatory Visit (INDEPENDENT_AMBULATORY_CARE_PROVIDER_SITE_OTHER): Payer: Self-pay | Admitting: Orthopaedic Surgery

## 2016-08-22 DIAGNOSIS — G8929 Other chronic pain: Secondary | ICD-10-CM

## 2016-08-22 DIAGNOSIS — M5442 Lumbago with sciatica, left side: Principal | ICD-10-CM

## 2016-08-24 ENCOUNTER — Telehealth (INDEPENDENT_AMBULATORY_CARE_PROVIDER_SITE_OTHER): Payer: Self-pay | Admitting: Orthopaedic Surgery

## 2016-08-24 NOTE — Telephone Encounter (Signed)
PT ASKED IF HE CAN HAVE SOMETHING FOR PAIN PRIOR TO APPT Wednesday.  [phone]

## 2016-08-24 NOTE — Telephone Encounter (Signed)
Please advise 

## 2016-08-24 NOTE — Telephone Encounter (Signed)
The only thing we can prescribe is tramadol 50 mg, 1-2 twice daily as needed for pain, #60. Thanks

## 2016-08-25 NOTE — Telephone Encounter (Signed)
Called into pharmacy. Patient aware. 

## 2016-08-29 ENCOUNTER — Other Ambulatory Visit (INDEPENDENT_AMBULATORY_CARE_PROVIDER_SITE_OTHER): Payer: Self-pay | Admitting: Orthopaedic Surgery

## 2016-08-29 ENCOUNTER — Ambulatory Visit
Admission: RE | Admit: 2016-08-29 | Discharge: 2016-08-29 | Disposition: A | Discharge status: Home / Self Care | Payer: BLUE CROSS/BLUE SHIELD | Source: Ambulatory Visit | Attending: Orthopaedic Surgery | Admitting: Orthopaedic Surgery

## 2016-08-29 DIAGNOSIS — M5442 Lumbago with sciatica, left side: Principal | ICD-10-CM

## 2016-08-29 DIAGNOSIS — G8929 Other chronic pain: Secondary | ICD-10-CM

## 2016-08-29 DIAGNOSIS — M48061 Spinal stenosis, lumbar region without neurogenic claudication: Secondary | ICD-10-CM | POA: Diagnosis not present

## 2016-08-29 MED ORDER — IOPAMIDOL (ISOVUE-M 200) INJECTION 41%
1.0000 mL | Freq: Once | INTRAMUSCULAR | Status: AC
  Administered 2016-08-29: 1 mL via EPIDURAL

## 2016-08-29 MED ORDER — METHYLPREDNISOLONE ACETATE 40 MG/ML INJ SUSP (RADIOLOG
120.0000 mg | Freq: Once | INTRAMUSCULAR | Status: AC
  Administered 2016-08-29: 120 mg via EPIDURAL

## 2016-08-30 ENCOUNTER — Ambulatory Visit (INDEPENDENT_AMBULATORY_CARE_PROVIDER_SITE_OTHER): Payer: BLUE CROSS/BLUE SHIELD | Admitting: Orthopaedic Surgery

## 2016-08-30 ENCOUNTER — Encounter (INDEPENDENT_AMBULATORY_CARE_PROVIDER_SITE_OTHER): Payer: Self-pay | Admitting: Orthopaedic Surgery

## 2016-08-30 VITALS — Ht 74.0 in | Wt 186.0 lb

## 2016-08-30 DIAGNOSIS — G8929 Other chronic pain: Secondary | ICD-10-CM

## 2016-08-30 DIAGNOSIS — M5442 Lumbago with sciatica, left side: Secondary | ICD-10-CM | POA: Diagnosis not present

## 2016-08-30 MED ORDER — HYDROCODONE-ACETAMINOPHEN 10-325 MG PO TABS: 1.0000 | ORAL_TABLET | Freq: Four times a day (QID) | ORAL | 0 refills | Status: DC | PRN

## 2016-08-30 MED ORDER — TIZANIDINE HCL 4 MG PO TABS: 4.0000 mg | ORAL_TABLET | Freq: Three times a day (TID) | ORAL | 0 refills | Status: DC | PRN

## 2016-08-30 NOTE — Progress Notes (Signed)
The patient is very pleasant 66 year old gentleman well known to me. He is someone who we obtain an MRI of his lumbar spine last year in April and he showed significant disc protrusion at L4-L5 to the left side. He also had spondylolisthesis at L4-L5 as well. In the past and epidural steroid injection were greatly and will her try this again. However this point is not helped at all. He is having significant pain going down his left leg with significant radicular symptoms and discomfort. He is a very thin individual. This is now affecting his work. He had an injection even yesterday by Dr. Ernestina Patches and this has not helped at all. He denies any change in bowel or bladder function but he appears obviously uncomfortable and he is interested in a surgical referral.  Again the MRI of his lumbar spine in April 2017 showed a 2 mm anterior slip of L4 on L5. There was a large lateral disc protrusion is markedly large and displacing the L4 nerve root. On exam today he has significant numbness in the L4-L5 distribution and a positive straight leg raise. He appears very uncomfortable and there is some slight weakness in his foot.  At this point I would like to send him to someone such as Dr. Kary Kos [facility] neurosurgery for a surgical evaluation. I did give her prescription for pain medication and a muscle relaxant day and recommended he take 800 mg over-the-counter ibuprofen as well. All questions were encouraged and answered and we'll work on this referral.

## 2016-09-01 ENCOUNTER — Other Ambulatory Visit: Payer: Self-pay | Admitting: Neurosurgery

## 2016-09-01 DIAGNOSIS — M5126 Other intervertebral disc displacement, lumbar region: Secondary | ICD-10-CM | POA: Diagnosis not present

## 2016-09-01 DIAGNOSIS — M4316 Spondylolisthesis, lumbar region: Secondary | ICD-10-CM

## 2016-09-03 ENCOUNTER — Emergency Department (HOSPITAL_COMMUNITY)
Admission: EM | Admit: 2016-09-03 | Discharge: 2016-09-03 | Disposition: A | Discharge status: Home / Self Care | Payer: BLUE CROSS/BLUE SHIELD | Attending: Emergency Medicine | Admitting: Emergency Medicine

## 2016-09-03 ENCOUNTER — Encounter (HOSPITAL_COMMUNITY): Payer: Self-pay

## 2016-09-03 ENCOUNTER — Emergency Department (HOSPITAL_COMMUNITY): Payer: BLUE CROSS/BLUE SHIELD

## 2016-09-03 DIAGNOSIS — S3992XA Unspecified injury of lower back, initial encounter: Secondary | ICD-10-CM | POA: Diagnosis not present

## 2016-09-03 DIAGNOSIS — Z87891 Personal history of nicotine dependence: Secondary | ICD-10-CM | POA: Insufficient documentation

## 2016-09-03 DIAGNOSIS — Z79899 Other long term (current) drug therapy: Secondary | ICD-10-CM | POA: Insufficient documentation

## 2016-09-03 DIAGNOSIS — M545 Low back pain, unspecified: Secondary | ICD-10-CM

## 2016-09-03 DIAGNOSIS — M546 Pain in thoracic spine: Secondary | ICD-10-CM | POA: Diagnosis not present

## 2016-09-03 DIAGNOSIS — R52 Pain, unspecified: Secondary | ICD-10-CM | POA: Diagnosis not present

## 2016-09-03 HISTORY — DX: Dorsalgia, unspecified: M54.9

## 2016-09-03 MED ORDER — DIAZEPAM 5 MG PO TABS: 5.0000 mg | ORAL_TABLET | Freq: Three times a day (TID) | ORAL | 0 refills | Status: DC | PRN

## 2016-09-03 MED ORDER — OXYCODONE-ACETAMINOPHEN 5-325 MG PO TABS: 1.0000 | ORAL_TABLET | Freq: Four times a day (QID) | ORAL | 0 refills | Status: DC | PRN

## 2016-09-03 MED ORDER — LORAZEPAM 2 MG/ML IJ SOLN
1.0000 mg | Freq: Once | INTRAMUSCULAR | Status: AC
  Administered 2016-09-03: 1 mg via INTRAVENOUS
  Filled 2016-09-03: qty 1

## 2016-09-03 MED ORDER — KETOROLAC TROMETHAMINE 15 MG/ML IJ SOLN
15.0000 mg | Freq: Once | INTRAMUSCULAR | Status: AC
  Administered 2016-09-03: 15 mg via INTRAVENOUS
  Filled 2016-09-03: qty 1

## 2016-09-03 MED ORDER — HYDROMORPHONE HCL 1 MG/ML IJ SOLN
1.0000 mg | Freq: Once | INTRAMUSCULAR | Status: AC
  Administered 2016-09-03: 1 mg via INTRAVENOUS
  Filled 2016-09-03: qty 1

## 2016-09-03 MED ORDER — SODIUM CHLORIDE 0.9 % IV SOLN
INTRAVENOUS | Status: DC
  Administered 2016-09-03: 19:00:00 via INTRAVENOUS

## 2016-09-03 NOTE — ED Provider Notes (Signed)
[facility] DEPT Provider Note   CSN: [id number] Arrival date & time: 09/03/16  1710   By signing my name below, I, Jon Choi, attest that this documentation has been prepared under the direction and in the presence of Jon Manifold, MD. Electronically Signed: [place], ED Scribe. 09/03/16. 6:27 PM.  History   Chief Complaint Chief Complaint  Patient presents with  . Fall  . Back Pain    HPI Jon Choi is a 66 y.o. male with a PMHx of back pain, who presents to the Emergency Department complaining of lower back pain s/p fall onset yesterday. Pt has tried Rx tramadol and prednisone with no relief of his symptoms. He notes that he was ambulating when he was tripped by his dog which caused him to fall backwards and land on his lower back. Pt lower back pain is exacerbated with movement. He notes that his current back pain is not similar to his hx of chronic back pain. Pt reports that he has a hx of chronic back pain x 1 year (bulging discs and spur on spine) that has been treated with a recent epidural and steroid injection 5 days ago and last evaluated by his orthopedist 2 days ago. Pt states that his orthopedist ordered an MRI for the pt lower back on 8/3 to determine if surgery is necessary. Denies hitting his head, LOC, abdominal pain, difficulty urinating, fever, chills, numbness, tingling, and any other symptoms. Denies prior back surgeries or taking daily blood thinners.    The history is provided by the patient. No language interpreter was used.    Past Medical History:  Diagnosis Date  . Back pain     Patient Active Problem List   Diagnosis Date Noted  . HLD (hyperlipidemia) 06/22/2015  . BP (high blood pressure) 06/22/2015  . Malignant melanoma ([place]) 06/22/2015  . Gonalgia 07/01/2013    No past surgical history on file.     Home Medications    Prior to Admission medications   Medication Sig Start Date End Date Taking? Authorizing Provider    celecoxib (CELEBREX) 200 MG capsule Take 200 mg by mouth daily.  08/19/16  Yes [provider]  methylPREDNISolone (MEDROL DOSEPAK) 4 MG TBPK tablet Take 1 tablet by mouth as directed. Taper dosepak 09/01/16  Yes [provider]  metoprolol succinate (TOPROL-XL) 50 MG 24 hr tablet Take 50 mg by mouth daily.    Yes [provider]  simvastatin (ZOCOR) 20 MG tablet Take 20 mg by mouth daily.   Yes [provider]  temazepam (RESTORIL) 30 MG capsule Take 30 mg by mouth at bedtime.  08/24/16  Yes [provider]  traMADol (ULTRAM) 50 MG tablet Take 50-100 mg by mouth 2 (two) times daily as needed for moderate pain or severe pain.  08/25/16  Yes [provider]  HYDROcodone-acetaminophen (NORCO) 10-325 MG tablet Take 1 tablet by mouth every 6 (six) hours as needed. Patient not taking: Reported on 09/03/2016 08/30/16   Mcarthur Rossetti, MD  tiZANidine (ZANAFLEX) 4 MG tablet Take 1 tablet (4 mg total) by mouth every 8 (eight) hours as needed for muscle spasms. Patient not taking: Reported on 09/03/2016 08/30/16   Mcarthur Rossetti, MD    Family History No family history on file.  Social History Social History  Substance Use Topics  . Smoking status: Former Research scientist (life sciences)  . Smokeless tobacco: Never Used  . Alcohol use Yes     Allergies   Patient has no known  allergies.   Review of Systems Review of Systems  Constitutional: Negative for chills and fever.  Gastrointestinal: Negative for abdominal pain.  Genitourinary: Negative for difficulty urinating.  Musculoskeletal: Positive for back pain (lower).  Neurological: Negative for syncope and numbness.       No tingling     Physical Exam Updated Vital Signs BP 137/87 (BP Location: Left Arm)   Pulse 77   Temp 98.4 F (36.9 C) (Oral)   Resp 18   SpO2 94%   Physical Exam  Constitutional: He is oriented to person, place, and time. He appears well-developed and well-nourished. No  distress.  Appears uncomfortable.   HENT:  Head: Normocephalic and atraumatic.  Eyes: EOM are normal.  Neck: Neck supple.  Cardiovascular: Normal rate, regular rhythm and normal heart sounds.  Exam reveals no gallop and no friction rub.   No murmur heard. Pulmonary/Chest: Effort normal and breath sounds normal. No respiratory distress. He has no wheezes. He has no rales.  Abdominal: Soft. He exhibits no distension. There is no tenderness.  Musculoskeletal: Normal range of motion.       Lumbar back: He exhibits tenderness.  Tender diffusely across lumbar spine. Sensation intact to light touch. Strong dorsi plantar flexion.   Neurological: He is alert and oriented to person, place, and time.  Skin: Skin is warm and dry.  Psychiatric: He has a normal mood and affect. His behavior is normal.  Nursing note and vitals reviewed.    ED Treatments / Results  DIAGNOSTIC STUDIES: Oxygen Saturation is 94% on RA, adequate by my interpretation.    COORDINATION OF CARE: 6:25 PM Discussed treatment plan with pt at bedside and pt agreed to plan.   Labs (all labs ordered are listed, but only abnormal results are displayed) Labs Reviewed - No data to display  EKG  EKG Interpretation None       Radiology Dg Lumbar Spine Complete  Result Date: 09/03/2016 CLINICAL DATA:  [place] backwards today well dog walking. Low back pain. EXAM: LUMBAR SPINE - COMPLETE 4+ VIEW COMPARISON:  06/08/2015 FINDINGS: no evidence of fracture. Chronic lower lumbar facet arthropathy and degenerative disc disease with disc space narrowing. Chronic anterolisthesis at L4-5 of 7 mm because of the facet arthropathy. No acute or traumatic finding. IMPRESSION: No acute or traumatic finding. Chronic lumbar degenerative disc disease and degenerative facet disease. Anterolisthesis at L4-5 because of the facet disease. Electronically Signed   By: Jon Choi M.D.   On: 09/03/2016 19:06    Procedures Procedures (including  critical care time)  Medications Ordered in ED Medications - No data to display   Initial Impression / Assessment and Plan / ED Course  I have reviewed the triage vital signs and the nursing notes.  Pertinent labs & imaging results that were available during my care of the patient were reviewed by me and considered in my medical decision making (see chart for details).     65yM with acute on chronic lower back pain. Radicular symptoms into RLE. Has good plntar/dorsiflexion. Movement at hip limited by pain. Great pulses in feet. Sensation intact to light touch.   Feels better after meds. Feels comfortable going home. Plan continued symptomatic tx. Has upcoming MRI scheduled. Return precautions discussed.   Final Clinical Impressions(s) / ED Diagnoses   Final diagnoses:  Acute bilateral low back pain without sciatica    New Prescriptions New Prescriptions   No medications on file   I personally preformed the services scribed in my presence.  The recorded information has been reviewed is accurate. Jon Manifold, MD.    Jon Manifold, MD 09/10/16 [phone]

## 2016-09-03 NOTE — ED Triage Notes (Signed)
He was struck by his running dog while out walking today, thereby causing him to topple backward landing on his back. He is here with c/o low back pain. He cites chronic back issues, with recent epidural steroid injection for same.

## 2016-09-03 NOTE — ED Notes (Signed)
No reaction to medication noted resting in bed with eyes closed family at bedside call light in reach no respiratory or acute distress noted.

## 2016-09-03 NOTE — ED Notes (Signed)
Bed: WA14 Expected date:  Expected time:  Means of arrival:  Comments: EMS 

## 2016-09-07 ENCOUNTER — Other Ambulatory Visit (INDEPENDENT_AMBULATORY_CARE_PROVIDER_SITE_OTHER): Payer: Self-pay

## 2016-09-07 ENCOUNTER — Telehealth (INDEPENDENT_AMBULATORY_CARE_PROVIDER_SITE_OTHER): Payer: Self-pay | Admitting: Orthopaedic Surgery

## 2016-09-07 MED ORDER — TRAMADOL HCL 50 MG PO TABS: 50.0000 mg | ORAL_TABLET | Freq: Two times a day (BID) | ORAL | 0 refills | Status: DC | PRN

## 2016-09-07 NOTE — Telephone Encounter (Signed)
Patient called needing Rx refilled (Tramadol) The number to contact patient is [phone]

## 2016-09-07 NOTE — Telephone Encounter (Signed)
Please advise 

## 2016-09-07 NOTE — Telephone Encounter (Signed)
Please send in some more Tramadol, #60

## 2016-09-07 NOTE — Telephone Encounter (Signed)
Called into pharmacy

## 2016-09-08 ENCOUNTER — Inpatient Hospital Stay (HOSPITAL_COMMUNITY)
Admission: EM | Admit: 2016-09-08 | Discharge: 2016-09-15 | DRG: 463 | Disposition: A | Discharge status: Home Health | Payer: BLUE CROSS/BLUE SHIELD | Attending: Internal Medicine | Admitting: Internal Medicine

## 2016-09-08 ENCOUNTER — Emergency Department (HOSPITAL_COMMUNITY): Payer: BLUE CROSS/BLUE SHIELD

## 2016-09-08 ENCOUNTER — Encounter (HOSPITAL_COMMUNITY): Payer: Self-pay | Admitting: Emergency Medicine

## 2016-09-08 ENCOUNTER — Other Ambulatory Visit (INDEPENDENT_AMBULATORY_CARE_PROVIDER_SITE_OTHER): Payer: Self-pay | Admitting: Orthopaedic Surgery

## 2016-09-08 DIAGNOSIS — N179 Acute kidney failure, unspecified: Secondary | ICD-10-CM | POA: Diagnosis not present

## 2016-09-08 DIAGNOSIS — E784 Other hyperlipidemia: Secondary | ICD-10-CM | POA: Diagnosis not present

## 2016-09-08 DIAGNOSIS — G8929 Other chronic pain: Secondary | ICD-10-CM | POA: Diagnosis not present

## 2016-09-08 DIAGNOSIS — E876 Hypokalemia: Secondary | ICD-10-CM | POA: Diagnosis present

## 2016-09-08 DIAGNOSIS — G9340 Encephalopathy, unspecified: Secondary | ICD-10-CM | POA: Diagnosis not present

## 2016-09-08 DIAGNOSIS — A419 Sepsis, unspecified organism: Secondary | ICD-10-CM | POA: Diagnosis not present

## 2016-09-08 DIAGNOSIS — E785 Hyperlipidemia, unspecified: Secondary | ICD-10-CM | POA: Diagnosis present

## 2016-09-08 DIAGNOSIS — S199XXA Unspecified injury of neck, initial encounter: Secondary | ICD-10-CM | POA: Diagnosis not present

## 2016-09-08 DIAGNOSIS — E860 Dehydration: Secondary | ICD-10-CM | POA: Diagnosis present

## 2016-09-08 DIAGNOSIS — Z79891 Long term (current) use of opiate analgesic: Secondary | ICD-10-CM

## 2016-09-08 DIAGNOSIS — R000 Tachycardia, unspecified: Secondary | ICD-10-CM | POA: Diagnosis not present

## 2016-09-08 DIAGNOSIS — R0602 Shortness of breath: Secondary | ICD-10-CM | POA: Diagnosis not present

## 2016-09-08 DIAGNOSIS — K651 Peritoneal abscess: Secondary | ICD-10-CM | POA: Diagnosis not present

## 2016-09-08 DIAGNOSIS — M48061 Spinal stenosis, lumbar region without neurogenic claudication: Secondary | ICD-10-CM | POA: Diagnosis not present

## 2016-09-08 DIAGNOSIS — A4901 Methicillin susceptible Staphylococcus aureus infection, unspecified site: Secondary | ICD-10-CM | POA: Diagnosis not present

## 2016-09-08 DIAGNOSIS — M4856XA Collapsed vertebra, not elsewhere classified, lumbar region, initial encounter for fracture: Secondary | ICD-10-CM | POA: Diagnosis not present

## 2016-09-08 DIAGNOSIS — S3992XA Unspecified injury of lower back, initial encounter: Secondary | ICD-10-CM | POA: Diagnosis not present

## 2016-09-08 DIAGNOSIS — K6812 Psoas muscle abscess: Secondary | ICD-10-CM | POA: Diagnosis not present

## 2016-09-08 DIAGNOSIS — Z96641 Presence of right artificial hip joint: Secondary | ICD-10-CM | POA: Diagnosis not present

## 2016-09-08 DIAGNOSIS — A4101 Sepsis due to Methicillin susceptible Staphylococcus aureus: Secondary | ICD-10-CM | POA: Diagnosis not present

## 2016-09-08 DIAGNOSIS — Y831 Surgical operation with implant of artificial internal device as the cause of abnormal reaction of the patient, or of later complication, without mention of misadventure at the time of the procedure: Secondary | ICD-10-CM | POA: Diagnosis present

## 2016-09-08 DIAGNOSIS — T8459XS Infection and inflammatory reaction due to other internal joint prosthesis, sequela: Secondary | ICD-10-CM | POA: Diagnosis not present

## 2016-09-08 DIAGNOSIS — F17210 Nicotine dependence, cigarettes, uncomplicated: Secondary | ICD-10-CM | POA: Diagnosis present

## 2016-09-08 DIAGNOSIS — Z96649 Presence of unspecified artificial hip joint: Secondary | ICD-10-CM

## 2016-09-08 DIAGNOSIS — M549 Dorsalgia, unspecified: Secondary | ICD-10-CM | POA: Diagnosis present

## 2016-09-08 DIAGNOSIS — M542 Cervicalgia: Secondary | ICD-10-CM | POA: Diagnosis not present

## 2016-09-08 DIAGNOSIS — T8459XA Infection and inflammatory reaction due to other internal joint prosthesis, initial encounter: Secondary | ICD-10-CM

## 2016-09-08 DIAGNOSIS — A4902 Methicillin resistant Staphylococcus aureus infection, unspecified site: Secondary | ICD-10-CM | POA: Diagnosis not present

## 2016-09-08 DIAGNOSIS — N400 Enlarged prostate without lower urinary tract symptoms: Secondary | ICD-10-CM | POA: Diagnosis present

## 2016-09-08 DIAGNOSIS — E871 Hypo-osmolality and hyponatremia: Secondary | ICD-10-CM | POA: Diagnosis not present

## 2016-09-08 DIAGNOSIS — R404 Transient alteration of awareness: Secondary | ICD-10-CM | POA: Diagnosis not present

## 2016-09-08 DIAGNOSIS — B9561 Methicillin susceptible Staphylococcus aureus infection as the cause of diseases classified elsewhere: Secondary | ICD-10-CM | POA: Diagnosis not present

## 2016-09-08 DIAGNOSIS — M5126 Other intervertebral disc displacement, lumbar region: Secondary | ICD-10-CM | POA: Diagnosis not present

## 2016-09-08 DIAGNOSIS — I10 Essential (primary) hypertension: Secondary | ICD-10-CM | POA: Diagnosis not present

## 2016-09-08 DIAGNOSIS — M25559 Pain in unspecified hip: Secondary | ICD-10-CM

## 2016-09-08 DIAGNOSIS — N171 Acute kidney failure with acute cortical necrosis: Secondary | ICD-10-CM | POA: Diagnosis not present

## 2016-09-08 DIAGNOSIS — M5106 Intervertebral disc disorders with myelopathy, lumbar region: Secondary | ICD-10-CM | POA: Diagnosis not present

## 2016-09-08 DIAGNOSIS — T8451XA Infection and inflammatory reaction due to internal right hip prosthesis, initial encounter: Secondary | ICD-10-CM | POA: Diagnosis not present

## 2016-09-08 DIAGNOSIS — Z79899 Other long term (current) drug therapy: Secondary | ICD-10-CM

## 2016-09-08 DIAGNOSIS — K5900 Constipation, unspecified: Secondary | ICD-10-CM | POA: Diagnosis present

## 2016-09-08 DIAGNOSIS — L0291 Cutaneous abscess, unspecified: Secondary | ICD-10-CM | POA: Diagnosis not present

## 2016-09-08 DIAGNOSIS — S0990XA Unspecified injury of head, initial encounter: Secondary | ICD-10-CM | POA: Diagnosis not present

## 2016-09-08 DIAGNOSIS — M4316 Spondylolisthesis, lumbar region: Secondary | ICD-10-CM | POA: Diagnosis not present

## 2016-09-08 DIAGNOSIS — T8459XD Infection and inflammatory reaction due to other internal joint prosthesis, subsequent encounter: Secondary | ICD-10-CM | POA: Diagnosis not present

## 2016-09-08 DIAGNOSIS — Z471 Aftercare following joint replacement surgery: Secondary | ICD-10-CM | POA: Diagnosis not present

## 2016-09-08 DIAGNOSIS — M545 Low back pain: Secondary | ICD-10-CM | POA: Diagnosis not present

## 2016-09-08 DIAGNOSIS — R531 Weakness: Secondary | ICD-10-CM | POA: Diagnosis not present

## 2016-09-08 DIAGNOSIS — Z85820 Personal history of malignant melanoma of skin: Secondary | ICD-10-CM

## 2016-09-08 HISTORY — DX: Malignant melanoma of skin, unspecified: C43.9

## 2016-09-08 LAB — URINALYSIS, ROUTINE W REFLEX MICROSCOPIC
Bilirubin Urine: NEGATIVE
GLUCOSE, UA: NEGATIVE mg/dL
Ketones, ur: NEGATIVE mg/dL
Leukocytes, UA: NEGATIVE
NITRITE: NEGATIVE
Protein, ur: 100 mg/dL — AB
SPECIFIC GRAVITY, URINE: 1.025 (ref 1.005–1.030)
Squamous Epithelial / LPF: NONE SEEN
pH: 5 (ref 5.0–8.0)

## 2016-09-08 LAB — CBC WITH DIFFERENTIAL/PLATELET
BASOS PCT: 0 %
Basophils Absolute: 0 10*3/uL (ref 0.0–0.1)
EOS ABS: 0 10*3/uL (ref 0.0–0.7)
Eosinophils Relative: 0 %
HCT: 37.3 % — ABNORMAL LOW (ref 39.0–52.0)
HEMOGLOBIN: 12.7 g/dL — AB (ref 13.0–17.0)
Lymphocytes Relative: 4 %
Lymphs Abs: 0.6 10*3/uL — ABNORMAL LOW (ref 0.7–4.0)
MCH: 32.1 pg (ref 26.0–34.0)
MCHC: 34 g/dL (ref 30.0–36.0)
MCV: 94.2 fL (ref 78.0–100.0)
Monocytes Absolute: 1.1 10*3/uL — ABNORMAL HIGH (ref 0.1–1.0)
Monocytes Relative: 7 %
NEUTROS PCT: 89 %
Neutro Abs: 13 10*3/uL — ABNORMAL HIGH (ref 1.7–7.7)
Platelets: 219 10*3/uL (ref 150–400)
RBC: 3.96 MIL/uL — AB (ref 4.22–5.81)
RDW: 12.8 % (ref 11.5–15.5)
WBC: 14.7 10*3/uL — AB (ref 4.0–10.5)

## 2016-09-08 LAB — I-STAT CG4 LACTIC ACID, ED: LACTIC ACID, VENOUS: 1.48 mmol/L (ref 0.5–1.9)

## 2016-09-08 LAB — COMPREHENSIVE METABOLIC PANEL
ALK PHOS: 73 U/L (ref 38–126)
ALT: 35 U/L (ref 17–63)
AST: 47 U/L — ABNORMAL HIGH (ref 15–41)
Albumin: 2.7 g/dL — ABNORMAL LOW (ref 3.5–5.0)
Anion gap: 12 (ref 5–15)
BUN: 39 mg/dL — ABNORMAL HIGH (ref 6–20)
CALCIUM: 9.5 mg/dL (ref 8.9–10.3)
CHLORIDE: 94 mmol/L — AB (ref 101–111)
CO2: 24 mmol/L (ref 22–32)
CREATININE: 1.63 mg/dL — AB (ref 0.61–1.24)
GFR, EST AFRICAN AMERICAN: 50 mL/min — AB (ref >60)
GFR, EST NON AFRICAN AMERICAN: 43 mL/min — AB (ref >60)
Glucose, Bld: 140 mg/dL — ABNORMAL HIGH (ref 65–99)
Potassium: 3.9 mmol/L (ref 3.5–5.1)
Sodium: 130 mmol/L — ABNORMAL LOW (ref 135–145)
Total Bilirubin: 1.2 mg/dL (ref 0.3–1.2)
Total Protein: 6.9 g/dL (ref 6.5–8.1)

## 2016-09-08 LAB — CBG MONITORING, ED: Glucose-Capillary: 134 mg/dL — ABNORMAL HIGH (ref 65–99)

## 2016-09-08 MED ORDER — VANCOMYCIN HCL IN DEXTROSE 1-5 GM/200ML-% IV SOLN
1000.0000 mg | Freq: Once | INTRAVENOUS | Status: AC
  Administered 2016-09-08: 1000 mg via INTRAVENOUS
  Filled 2016-09-08: qty 200

## 2016-09-08 MED ORDER — FENTANYL CITRATE (PF) 100 MCG/2ML IJ SOLN
50.0000 ug | Freq: Once | INTRAMUSCULAR | Status: AC
  Administered 2016-09-08: 50 ug via INTRAVENOUS
  Filled 2016-09-08: qty 2

## 2016-09-08 MED ORDER — SODIUM CHLORIDE 0.9 % IV BOLUS (SEPSIS)
500.0000 mL | Freq: Once | INTRAVENOUS | Status: AC
  Administered 2016-09-08: 500 mL via INTRAVENOUS

## 2016-09-08 MED ORDER — PIPERACILLIN-TAZOBACTAM 3.375 G IVPB 30 MIN
3.3750 g | Freq: Once | INTRAVENOUS | Status: AC
  Administered 2016-09-08: 3.375 g via INTRAVENOUS
  Filled 2016-09-08: qty 50

## 2016-09-08 NOTE — Telephone Encounter (Signed)
Please advise 

## 2016-09-08 NOTE — Telephone Encounter (Signed)
Approve both of them? I can get Junie Panning to sign for the Norco

## 2016-09-08 NOTE — ED Notes (Signed)
MD at bedside. 

## 2016-09-08 NOTE — ED Triage Notes (Signed)
Pt BIB [facility] from home with c/o back pain and left leg numbness, with increased weakness. Pt recently seen at [facility] and diagnosed with bone spurs in the lumbar region. Pt taking home pain medications without relief, last dose at hydrocodone at 1400 today. Per EMS, [phone]

## 2016-09-08 NOTE — ED Notes (Signed)
Patient transported to CT 

## 2016-09-08 NOTE — Consult Note (Signed)
Reason for Consult: leg pain and weakness  Referring Physician: Dr. Dollene Primrose Jon Choi is an 66 y.o. male.   HPI:  Patient comes in today with worsening back, leg pain and bilateral leg weakness that has progressively gotten worse over the last couple days. He is a patient of Dr. Annamaria Boots, who he saw last weak because of his worsening back pain. He states that he was unable to walk at home today. He has had fevers since Tuesday with a t max of 100.3. His daughter is bedside and states that he has become more confused the last couple days as well. His pain starts in his lower back and radiates over his hips down his legs into his feet. He denies any bowel or bladder changes.  Past Medical History:  Diagnosis Date  . Back pain     History reviewed. No pertinent surgical history.  No Known Allergies  Social History  Substance Use Topics  . Smoking status: Former Research scientist (life sciences)  . Smokeless tobacco: Never Used  . Alcohol use Yes    No family history on file.   Review of Systems  Positive ROS: BLE weakness 3+  All other systems have been reviewed and were otherwise negative with the exception of those mentioned in the HPI and as above.  Objective: Vital signs in last 24 hours: Temp:  [98.2 F (36.8 C)-99.8 F (37.7 C)] 99.8 F (37.7 C) (07/27 1716) Pulse Rate:  [31-126] 92 (07/27 2200) Resp:  [14-29] 23 (07/27 2130) BP: (109-139)/(65-95) 138/65 (07/27 2200) SpO2:  [93 %-97 %] 96 % (07/27 2200) Weight:  [76.2 kg (168 lb)] 76.2 kg (168 lb) (07/27 1527)  General Appearance: Alert, cooperative, no distress, appears stated age Head: Normocephalic Eyes: not tested Ears: Not tested Throat:not tested Neck: not tested Back: not tested Lungs:respirations unlabored Heart: Tachycardic Abdomen: not tested   NEUROLOGIC:   Mental status: A&O x4, no aphasia, good attention span, Memory and fund of knowledge, confused  Motor Exam - lower extremity weakness 4+ Sensory Exam - bilateral lower  extremity numbness Coordination - not tested Gait - not tested Balance - not tested Cranial Nerves: I: smell Not tested  II: visual acuity  OS: NA  OD: NA  II: visual fields Full to confrontation  II: pupils   III,VII: ptosis None  III,IV,VI: extraocular muscles  Full ROM  V: mastication   V: facial light touch sensation    V,VII: corneal reflex    VII: facial muscle function - upper    VII: facial muscle function - lower   VIII: hearing   IX: soft palate elevation    IX,X: gag reflex   XI: trapezius strength    XI: sternocleidomastoid strength   XI: neck flexion strength    XII: tongue strength      Data Review Lab Results  Component Value Date   WBC 14.7 (H) 09/08/2016   HGB 12.7 (L) 09/08/2016   HCT 37.3 (L) 09/08/2016   MCV 94.2 09/08/2016   PLT 219 09/08/2016   Lab Results  Component Value Date   NA 130 (L) 09/08/2016   K 3.9 09/08/2016   CL 94 (L) 09/08/2016   CO2 24 09/08/2016   BUN 39 (H) 09/08/2016   CREATININE 1.63 (H) 09/08/2016   GLUCOSE 140 (H) 09/08/2016   No results found for: INR, PROTIME  Radiology: Dg Chest 2 View  Result Date: 09/08/2016 CLINICAL DATA:  Shortness of Breath EXAM: CHEST  2 VIEW COMPARISON:  05/25/2007 FINDINGS: The  heart size and mediastinal contours are within normal limits. Both lungs are clear. The visualized skeletal structures are unremarkable. IMPRESSION: No active cardiopulmonary disease. Electronically Signed   By: Inez Catalina M.D.   On: 09/08/2016 18:19   Ct Head Wo Contrast  Result Date: 09/08/2016 CLINICAL DATA:  Neck pain, weakness and confusion.  Fell 1 week ago. EXAM: CT HEAD WITHOUT CONTRAST CT CERVICAL SPINE WITHOUT CONTRAST TECHNIQUE: Multidetector CT imaging of the head and cervical spine was performed following the standard protocol without intravenous contrast. Multiplanar CT image reconstructions of the cervical spine were also generated. COMPARISON:  None. FINDINGS: CT HEAD FINDINGS Brain: Diffusely enlarged  ventricles and subarachnoid spaces. No intracranial hemorrhage, mass lesion or CT evidence of acute infarction. Vascular: No hyperdense vessel or unexpected calcification. Skull: Normal. Negative for fracture or focal lesion. Sinuses/Orbits: Mild bilateral inferior frontal and anterior ethmoid sinus mucosal thickening. Unremarkable orbits. Other: None. CT CERVICAL SPINE FINDINGS Alignment: Reversal of the normal cervical lordosis. Mild anterolisthesis at the C3-4 and C4-5 levels. Skull base and vertebrae: No acute fracture. No primary bone lesion or focal pathologic process. Soft tissues and spinal canal: No prevertebral fluid or swelling. No visible canal hematoma. Disc levels: Multilevel degenerative changes. These include facet degenerative changes throughout the cervical spine. Upper chest: Mild biapical pleural and parenchymal scarring. Other: Bilateral carotid artery calcifications. IMPRESSION: 1. No skull fracture or intracranial hemorrhage. 2. No cervical spine fracture or traumatic subluxation. 3. Mild diffuse cerebral atrophy. 4. Multilevel cervical spine degenerative changes. These include facet degenerative changes with associated mild anterolisthesis at the C3-4 C4-5 levels. 5. Dense bilateral carotid artery atheromatous calcifications. 6. Minimal chronic bilateral frontal and bilateral ethmoid sinusitis. Electronically Signed   By: Claudie Revering M.D.   On: 09/08/2016 18:08   Ct Cervical Spine Wo Contrast  Result Date: 09/08/2016 CLINICAL DATA:  Neck pain, weakness and confusion.  Fell 1 week ago. EXAM: CT HEAD WITHOUT CONTRAST CT CERVICAL SPINE WITHOUT CONTRAST TECHNIQUE: Multidetector CT imaging of the head and cervical spine was performed following the standard protocol without intravenous contrast. Multiplanar CT image reconstructions of the cervical spine were also generated. COMPARISON:  None. FINDINGS: CT HEAD FINDINGS Brain: Diffusely enlarged ventricles and subarachnoid spaces. No  intracranial hemorrhage, mass lesion or CT evidence of acute infarction. Vascular: No hyperdense vessel or unexpected calcification. Skull: Normal. Negative for fracture or focal lesion. Sinuses/Orbits: Mild bilateral inferior frontal and anterior ethmoid sinus mucosal thickening. Unremarkable orbits. Other: None. CT CERVICAL SPINE FINDINGS Alignment: Reversal of the normal cervical lordosis. Mild anterolisthesis at the C3-4 and C4-5 levels. Skull base and vertebrae: No acute fracture. No primary bone lesion or focal pathologic process. Soft tissues and spinal canal: No prevertebral fluid or swelling. No visible canal hematoma. Disc levels: Multilevel degenerative changes. These include facet degenerative changes throughout the cervical spine. Upper chest: Mild biapical pleural and parenchymal scarring. Other: Bilateral carotid artery calcifications. IMPRESSION: 1. No skull fracture or intracranial hemorrhage. 2. No cervical spine fracture or traumatic subluxation. 3. Mild diffuse cerebral atrophy. 4. Multilevel cervical spine degenerative changes. These include facet degenerative changes with associated mild anterolisthesis at the C3-4 C4-5 levels. 5. Dense bilateral carotid artery atheromatous calcifications. 6. Minimal chronic bilateral frontal and bilateral ethmoid sinusitis. Electronically Signed   By: Claudie Revering M.D.   On: 09/08/2016 18:08   Ct Lumbar Spine Wo Contrast  Result Date: 09/08/2016 CLINICAL DATA:  Chronic low back pain and left leg numbness history of bone spurs EXAM: CT LUMBAR SPINE WITHOUT  CONTRAST TECHNIQUE: Multidetector CT imaging of the lumbar spine was performed without intravenous contrast administration. Multiplanar CT image reconstructions were also generated. COMPARISON:  MRI 09/08/2016, radiograph 09/03/2016 FINDINGS: Segmentation: 5 lumbar type vertebrae. Alignment: Grade 1 anterolisthesis of L4 on L5. Lumbar alignment otherwise within normal limits. Vertebrae: Mild irregularity  anterior superior endplate at L5 corresponding to MRI fracture. Paraspinal and other soft tissues: Aortic atherosclerosis. Partially visualized hypodense mass, likely intramuscular within the right pelvis. Disc levels: At T12-L1, no significant canal stenosis or disc disease. The foramen are patent bilaterally. At L2-L3, no significant disc disease or canal stenosis. The foramen are patent bilaterally. At L2-L3, no significant disc disease or canal stenosis. No significant canal stenosis or bony foraminal narrowing. Posterior facet arthropathy At L3-L4, no significant disc disease or canal stenosis. No significant bony foraminal narrowing. Posterior facet arthropathy At L4-L5, mild disc space narrowing with vacuum disc. Moderate to large disc extrusion with marked canal stenosis, and lateral extension of disc material. Bilateral facet hypertrophy. At L5-S1, no significant canal stenosis or focal disc disease. The foramen are patent bilaterally. IMPRESSION: 1. Minimal irregularity anterior superior endplate at L5, corresponding to subtle compression fracture noted on comparison MRI. L4 posttraumatic changes on MRI not well visualized by CT. No fracture of the posterior elements. 2. 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 Bilateral foraminal impingement. See MRI report. 3. Partially visualized hypodense mass within the right iliacus. Uncertain if this represents a hematoma or soft tissue mass. There is some edema and soft tissue stranding around the right psoas muscle, further evaluation with pelvic CT could be obtained if desired. Electronically Signed   By: Donavan Foil M.D.   On: 09/08/2016 21:11   Ct Pelvis Wo Contrast  Result Date: 09/08/2016 CLINICAL DATA:  Chronic low back pain and left leg numbness. Recent diagnosis of bone spurs in the lumbar region. EXAM: CT PELVIS WITHOUT CONTRAST TECHNIQUE: Multidetector CT  imaging of the pelvis was performed following the standard protocol without intravenous contrast. COMPARISON:  None. FINDINGS: Urinary Tract: Bladder wall is not thickened and no filling defects are identified. Bowel: Visualized portions of colon and small bowel are not abnormally distended. Stool-filled colon. Appendix is normal. Vascular/Lymphatic: Calcifications in the aorta and iliac vessels. No aneurysm. Reproductive: Prostate gland is enlarged, measuring about 4.8 cm diameter. Other: There is infiltration along the right iliopsoas margin with a low-attenuation expansile focus demonstrated in the iliacus muscle measuring about 2.7 x 4 cm in diameter. Appearance is suspicious for any iliopsoas abscess with adjacent inflammatory stranding. Visualization of the area around the right hip is limited due to streak artifact from right hip arthroplasty, but there appears to be small air-fluid levels with fluid collection anterior to the right hip which probably represents extension of the abscess. Musculoskeletal: Postoperative changes with right total hip arthroplasty. Visualized components appear well seated although visualization is limited due to streak artifact. No evidence of acute fracture or dislocation as visualized. SI joints and symphysis pubis are not displaced. Visualized sacrum and pelvis appear intact. Degenerative changes noted in the lower lumbar spine and in the left hip. IMPRESSION: 1. Inflammatory infiltration in around the right iliopsoas muscle with loculated iliacus muscle abscess and probable extension of the abscess anterior to the right hip arthroplasty. 2. Right total hip arthroplasty. 3. No acute bony abnormalities identified. 4. Degenerative changes in the lower lumbar spine and left hip. Electronically Signed  By: Lucienne Capers M.D.   On: 09/08/2016 20:58   Mr Lumbar Spine Wo Contrast  Result Date: 09/08/2016 CLINICAL DATA:  Patient fell 2 days ago, increasing LEFT leg pain and  weakness. EXAM: MRI LUMBAR SPINE WITHOUT CONTRAST TECHNIQUE: Multiplanar, multisequence MR imaging of the lumbar spine was performed. No intravenous contrast was administered. COMPARISON:  Plain films 09/03/2016. FINDINGS: Segmentation:  Standard. Alignment:  5 mm anterolisthesis L4-5. Vertebrae: There is a subtle superior endplate compression fracture of L5 anteriorly. Bone marrow edema is also seen in L4 vertebral body inferiorly. Conus medullaris: Extends to the L1 level and appears normal. Paraspinal and other soft tissues: There is a large hematoma in the RIGHT iliacus muscle. This is incompletely evaluated on axial images, as its inferior extent is not clearly identified, but cross-sectional measurements on image 35 are 25 x 39 mm. Disc levels: L1-L2:  Normal. L2-L3:  Normal. L3-L4:  Normal. L4-L5: 5 mm anterolisthesis is facet mediated. There is marked posterior element hypertrophy. There is a large disc extrusion with a cephalad migrated free fragment. Severe spinal stenosis is present. BILATERAL subarticular zone and foraminal zone narrowing affect the L4 and L5 nerve roots. Superimposed LEFT lateral extrusion at L4-5 in the extraforaminal compartment was present previously. L5-S1: Unremarkable disc space. Facet arthropathy. No impingement. IMPRESSION: Constellation of findings suggesting a minor posttraumatic superior endplate fracture at L5. There is also slight bone marrow edema noted inferiorly at L4, which could be due to the recent fall. Recommend CT lumbar spine without contrast to look for an occult fracture of the posterior elements. 5 mm anterolisthesis at L4-5 in conjunction with posterior element hypertrophy and a large extruded disc fragment contribute to severe spinal stenosis and BILATERAL L4 and L5 nerve root impingement. The stenosis and neural impingement have significantly progressed from prior MR of 06/08/2015. Large RIGHT iliacus hematoma? This too is likely posttraumatic although in  the setting of history of malignant melanoma, a metastasis cannot completely be excluded. Recommend CT pelvis with contrast, for further evaluation. Electronically Signed   By: Staci Righter M.D.   On: 09/08/2016 18:54    Assessment/Plan: Spondylolisthesis with severe stenosis at L4-5 with severe back and leg pain. Numbness bilateral lower extremities. Patient is not a candidate for elective lumbar decompression instrumentation and fusion given what we think is an active infectious process. Recommend MRI Lumbar spine with contrast to rule out epidural process (question small ventral fluid collection L2-L4, superior end plate signal change may not be fracture and could represent infection). Recommend IR needle aspiration of right pelvic mass which potentially could be abscess and the source of his pain and fevers. If possible would hold antibiotics until aspiration is done. Would recommend admission to medical service. Manage pain expectantly. Will follow. Dr. Saintclair Halsted will be notified Monday upon his return.   Ocie Cornfield [facility] 09/08/2016 10:43 PM

## 2016-09-08 NOTE — ED Provider Notes (Addendum)
[place] DEPT Provider Note   CSN: [id number] Arrival date & time: 09/08/16  1523     History   Chief Complaint Chief Complaint  Patient presents with  . Back Pain    HPI Jon Choi is a 66 y.o. male.  Patient is a 66 year old male with a history of back pain who presents with worsening back pain. He's been followed by Dr. Ninfa Linden with orthopedics for the last year for back pain resulting from a herniated disc at L4-L5. He had a fall about a week ago where her dog jumped on him and he lost his balance falling backward onto a sofa. He's had worsening back pain since that time. He's been taking tramadol and oxycodone for pain. He's been referred to Dr. Saintclair Halsted with neurosurgery and an MRI is scheduled for next Friday. His wife who is with him states that over the last few days she's had progressive weakness where he's not able to walk unassisted. His leg seemed to buckle under him. He seems to have more weakness in the right leg but it's involving both legs. He's also had some increased confusion which she feels may be related to the oxycodone that he's taking. She's noted a had a fever 3 days ago to 100.5. She hasn't noticed any fever since that time. She also noted that he's been generally weak with weakness in his arms and trouble sitting up on his own. He's had some nausea but no vomiting. He denies any abdominal pain. He's had some constipation related taking the pain medication.      Past Medical History:  Diagnosis Date  . Back pain     Patient Active Problem List   Diagnosis Date Noted  . HLD (hyperlipidemia) 06/22/2015  . BP (high blood pressure) 06/22/2015  . Malignant melanoma ([place]) 06/22/2015  . Gonalgia 07/01/2013    History reviewed. No pertinent surgical history.     Home Medications    Prior to Admission medications   Medication Sig Start Date End Date Taking? Authorizing Provider  celecoxib (CELEBREX) 200 MG capsule Take 200 mg by mouth daily.   08/19/16  Yes [provider]  diazepam (VALIUM) 5 MG tablet Take 1 tablet (5 mg total) by mouth every 8 (eight) hours as needed for muscle spasms. 09/03/16  Yes Virgel Manifold, MD  metoprolol succinate (TOPROL-XL) 50 MG 24 hr tablet Take 50 mg by mouth daily.    Yes [provider]  naproxen sodium (ANAPROX) 220 MG tablet Take 440 mg by mouth daily as needed (pain).   Yes [provider]  oxyCODONE-acetaminophen (PERCOCET/ROXICET) 5-325 MG tablet Take 1-2 tablets by mouth every 6 (six) hours as needed for severe pain. Patient taking differently: Take 1 tablet by mouth every 8 (eight) hours as needed for severe pain.  09/03/16  Yes Virgel Manifold, MD  simvastatin (ZOCOR) 20 MG tablet Take 20 mg by mouth daily.   Yes [provider]  temazepam (RESTORIL) 30 MG capsule Take 30 mg by mouth at bedtime.  08/24/16  Yes [provider]  tiZANidine (ZANAFLEX) 4 MG tablet Take 1 tablet (4 mg total) by mouth every 8 (eight) hours as needed for muscle spasms. 08/30/16  Yes Mcarthur Rossetti, MD  traMADol (ULTRAM) 50 MG tablet TAKE 1 TO 2 TABLETS BY MOUTH TWICE A DAY AS NEEDED FOR PAIN. Patient taking differently: TAKE 50MG -100MG  BY MOUTH EVERY 8 HOURS AS NEEDED FOR PAIN. 09/08/16  Yes Mcarthur Rossetti, MD  HYDROcodone-acetaminophen [facility]) 10-325 MG  tablet TAKE 1 TABLET BY MOUTH EVERY 6 HOURS AS NEEDED. Patient not taking: Reported on 09/08/2016 09/08/16   Mcarthur Rossetti, MD    Family History No family history on file.  Social History Social History  Substance Use Topics  . Smoking status: Former Research scientist (life sciences)  . Smokeless tobacco: Never Used  . Alcohol use Yes     Allergies   Patient has no known allergies.   Review of Systems Review of Systems  Unable to perform ROS: Mental status change     Physical Exam Updated Vital Signs BP 129/78   Pulse 100   Temp 99.8 F (37.7 C) (Rectal)   Resp (!) 23   Ht 6\' 1"  (1.854 m)   Wt 76.2 kg (168  lb)   SpO2 96%   BMI 22.16 kg/m   Physical Exam  Constitutional: He appears well-developed and well-nourished.  HENT:  Head: Normocephalic and atraumatic.  Eyes: Pupils are equal, round, and reactive to light. Conjunctivae are normal.  Neck:  Positive mild tenderness on palpation of the cervical spine, there is no pain to thoracic spine. There is tenderness in the lower lumbar spine. No step-offs or deformities are noted.  Cardiovascular: Tachycardia present.   No murmur heard. Pulmonary/Chest: Effort normal and breath sounds normal.  Abdominal: Bowel sounds are normal. He exhibits no distension. There is no tenderness. There is no guarding.  Musculoskeletal: Normal range of motion.  Neurological:  Patient is drowsy but will wake up and answer questions. He is confused. He is oriented to person and he knows that he is in the hospital. He could not tell me the year or the name of the hospital. He has generalized weakness but has good flexion and extension of the upper extremities. He can raise both arms symmetrically off the bed. He has limited ability to raise his legs off the bed although he has good plantarflexion and dorsiflexion of the feet. Sensation is intact to light touch in all extremities. Pedal pulses are intact. No facial droop is noted. No slurred speech.  Skin: He is diaphoretic.     ED Treatments / Results  Labs (all labs ordered are listed, but only abnormal results are displayed) Labs Reviewed  COMPREHENSIVE METABOLIC PANEL - Abnormal; Notable for the following:       Result Value   Sodium 130 (*)    Chloride 94 (*)    Glucose, Bld 140 (*)    BUN 39 (*)    Creatinine, Ser 1.63 (*)    Albumin 2.7 (*)    AST 47 (*)    GFR calc non Af Amer 43 (*)    GFR calc Af Amer 50 (*)    All other components within normal limits  CBC WITH DIFFERENTIAL/PLATELET - Abnormal; Notable for the following:    WBC 14.7 (*)    RBC 3.96 (*)    Hemoglobin 12.7 (*)    HCT 37.3 (*)     Neutro Abs 13.0 (*)    Lymphs Abs 0.6 (*)    Monocytes Absolute 1.1 (*)    All other components within normal limits  URINALYSIS, ROUTINE W REFLEX MICROSCOPIC - Abnormal; Notable for the following:    Color, Urine AMBER (*)    APPearance HAZY (*)    Hgb urine dipstick MODERATE (*)    Protein, ur 100 (*)    Bacteria, UA RARE (*)    All other components within normal limits  CBG MONITORING, ED - Abnormal; Notable for the following:  Glucose-Capillary 134 (*)    All other components within normal limits  CULTURE, BLOOD (ROUTINE X 2)  CULTURE, BLOOD (ROUTINE X 2)  I-STAT CG4 LACTIC ACID, ED    EKG  EKG Interpretation  Date/Time:  Friday September 08 2016 17:19:08 EDT Ventricular Rate:  132 PR Interval:    QRS Duration: 97 QT Interval:  312 QTC Calculation: 422 R Axis:   70 Text Interpretation:  Sinus tachycardia Atrial premature complexes Baseline wander in lead(s) II V3 Confirmed by Malvin Johns [phone]) on 09/08/2016 5:23:22 PM       Radiology Dg Chest 2 View  Result Date: 09/08/2016 CLINICAL DATA:  Shortness of Breath EXAM: CHEST  2 VIEW COMPARISON:  05/25/2007 FINDINGS: The heart size and mediastinal contours are within normal limits. Both lungs are clear. The visualized skeletal structures are unremarkable. IMPRESSION: No active cardiopulmonary disease. Electronically Signed   By: Inez Catalina M.D.   On: 09/08/2016 18:19   Ct Head Wo Contrast  Result Date: 09/08/2016 CLINICAL DATA:  Neck pain, weakness and confusion.  Fell 1 week ago. EXAM: CT HEAD WITHOUT CONTRAST CT CERVICAL SPINE WITHOUT CONTRAST TECHNIQUE: Multidetector CT imaging of the head and cervical spine was performed following the standard protocol without intravenous contrast. Multiplanar CT image reconstructions of the cervical spine were also generated. COMPARISON:  None. FINDINGS: CT HEAD FINDINGS Brain: Diffusely enlarged ventricles and subarachnoid spaces. No intracranial hemorrhage, mass lesion or CT evidence  of acute infarction. Vascular: No hyperdense vessel or unexpected calcification. Skull: Normal. Negative for fracture or focal lesion. Sinuses/Orbits: Mild bilateral inferior frontal and anterior ethmoid sinus mucosal thickening. Unremarkable orbits. Other: None. CT CERVICAL SPINE FINDINGS Alignment: Reversal of the normal cervical lordosis. Mild anterolisthesis at the C3-4 and C4-5 levels. Skull base and vertebrae: No acute fracture. No primary bone lesion or focal pathologic process. Soft tissues and spinal canal: No prevertebral fluid or swelling. No visible canal hematoma. Disc levels: Multilevel degenerative changes. These include facet degenerative changes throughout the cervical spine. Upper chest: Mild biapical pleural and parenchymal scarring. Other: Bilateral carotid artery calcifications. IMPRESSION: 1. No skull fracture or intracranial hemorrhage. 2. No cervical spine fracture or traumatic subluxation. 3. Mild diffuse cerebral atrophy. 4. Multilevel cervical spine degenerative changes. These include facet degenerative changes with associated mild anterolisthesis at the C3-4 C4-5 levels. 5. Dense bilateral carotid artery atheromatous calcifications. 6. Minimal chronic bilateral frontal and bilateral ethmoid sinusitis. Electronically Signed   By: Claudie Revering M.D.   On: 09/08/2016 18:08   Ct Cervical Spine Wo Contrast  Result Date: 09/08/2016 CLINICAL DATA:  Neck pain, weakness and confusion.  Fell 1 week ago. EXAM: CT HEAD WITHOUT CONTRAST CT CERVICAL SPINE WITHOUT CONTRAST TECHNIQUE: Multidetector CT imaging of the head and cervical spine was performed following the standard protocol without intravenous contrast. Multiplanar CT image reconstructions of the cervical spine were also generated. COMPARISON:  None. FINDINGS: CT HEAD FINDINGS Brain: Diffusely enlarged ventricles and subarachnoid spaces. No intracranial hemorrhage, mass lesion or CT evidence of acute infarction. Vascular: No hyperdense  vessel or unexpected calcification. Skull: Normal. Negative for fracture or focal lesion. Sinuses/Orbits: Mild bilateral inferior frontal and anterior ethmoid sinus mucosal thickening. Unremarkable orbits. Other: None. CT CERVICAL SPINE FINDINGS Alignment: Reversal of the normal cervical lordosis. Mild anterolisthesis at the C3-4 and C4-5 levels. Skull base and vertebrae: No acute fracture. No primary bone lesion or focal pathologic process. Soft tissues and spinal canal: No prevertebral fluid or swelling. No visible canal hematoma. Disc levels: Multilevel degenerative changes.  These include facet degenerative changes throughout the cervical spine. Upper chest: Mild biapical pleural and parenchymal scarring. Other: Bilateral carotid artery calcifications. IMPRESSION: 1. No skull fracture or intracranial hemorrhage. 2. No cervical spine fracture or traumatic subluxation. 3. Mild diffuse cerebral atrophy. 4. Multilevel cervical spine degenerative changes. These include facet degenerative changes with associated mild anterolisthesis at the C3-4 C4-5 levels. 5. Dense bilateral carotid artery atheromatous calcifications. 6. Minimal chronic bilateral frontal and bilateral ethmoid sinusitis. Electronically Signed   By: Claudie Revering M.D.   On: 09/08/2016 18:08   Ct Lumbar Spine Wo Contrast  Result Date: 09/08/2016 CLINICAL DATA:  Chronic low back pain and left leg numbness history of bone spurs EXAM: CT LUMBAR SPINE WITHOUT CONTRAST TECHNIQUE: Multidetector CT imaging of the lumbar spine was performed without intravenous contrast administration. Multiplanar CT image reconstructions were also generated. COMPARISON:  MRI 09/08/2016, radiograph 09/03/2016 FINDINGS: Segmentation: 5 lumbar type vertebrae. Alignment: Grade 1 anterolisthesis of L4 on L5. Lumbar alignment otherwise within normal limits. Vertebrae: Mild irregularity anterior superior endplate at L5 corresponding to MRI fracture. Paraspinal and other soft  tissues: Aortic atherosclerosis. Partially visualized hypodense mass, likely intramuscular within the right pelvis. Disc levels: At T12-L1, no significant canal stenosis or disc disease. The foramen are patent bilaterally. At L2-L3, no significant disc disease or canal stenosis. The foramen are patent bilaterally. At L2-L3, no significant disc disease or canal stenosis. No significant canal stenosis or bony foraminal narrowing. Posterior facet arthropathy At L3-L4, no significant disc disease or canal stenosis. No significant bony foraminal narrowing. Posterior facet arthropathy At L4-L5, mild disc space narrowing with vacuum disc. Moderate to large disc extrusion with marked canal stenosis, and lateral extension of disc material. Bilateral facet hypertrophy. At L5-S1, no significant canal stenosis or focal disc disease. The foramen are patent bilaterally. IMPRESSION: 1. Minimal irregularity anterior superior endplate at L5, corresponding to subtle compression fracture noted on comparison MRI. L4 posttraumatic changes on MRI not well visualized by CT. No fracture of the posterior elements. 2. 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 Bilateral foraminal impingement. See MRI report. 3. Partially visualized hypodense mass within the right iliacus. Uncertain if this represents a hematoma or soft tissue mass. There is some edema and soft tissue stranding around the right psoas muscle, further evaluation with pelvic CT could be obtained if desired. Electronically Signed   By: Donavan Foil M.D.   On: 09/08/2016 21:11   Ct Pelvis Wo Contrast  Result Date: 09/08/2016 CLINICAL DATA:  Chronic low back pain and left leg numbness. Recent diagnosis of bone spurs in the lumbar region. EXAM: CT PELVIS WITHOUT CONTRAST TECHNIQUE: Multidetector CT imaging of the pelvis was performed following the standard protocol without intravenous  contrast. COMPARISON:  None. FINDINGS: Urinary Tract: Bladder wall is not thickened and no filling defects are identified. Bowel: Visualized portions of colon and small bowel are not abnormally distended. Stool-filled colon. Appendix is normal. Vascular/Lymphatic: Calcifications in the aorta and iliac vessels. No aneurysm. Reproductive: Prostate gland is enlarged, measuring about 4.8 cm diameter. Other: There is infiltration along the right iliopsoas margin with a low-attenuation expansile focus demonstrated in the iliacus muscle measuring about 2.7 x 4 cm in diameter. Appearance is suspicious for any iliopsoas abscess with adjacent inflammatory stranding. Visualization of the area around the right hip is limited due to streak artifact from right hip arthroplasty, but there appears to be small air-fluid levels with  fluid collection anterior to the right hip which probably represents extension of the abscess. Musculoskeletal: Postoperative changes with right total hip arthroplasty. Visualized components appear well seated although visualization is limited due to streak artifact. No evidence of acute fracture or dislocation as visualized. SI joints and symphysis pubis are not displaced. Visualized sacrum and pelvis appear intact. Degenerative changes noted in the lower lumbar spine and in the left hip. IMPRESSION: 1. Inflammatory infiltration in around the right iliopsoas muscle with loculated iliacus muscle abscess and probable extension of the abscess anterior to the right hip arthroplasty. 2. Right total hip arthroplasty. 3. No acute bony abnormalities identified. 4. Degenerative changes in the lower lumbar spine and left hip. Electronically Signed   By: Lucienne Capers M.D.   On: 09/08/2016 20:58   Mr Lumbar Spine Wo Contrast  Result Date: 09/08/2016 CLINICAL DATA:  Patient fell 2 days ago, increasing LEFT leg pain and weakness. EXAM: MRI LUMBAR SPINE WITHOUT CONTRAST TECHNIQUE: Multiplanar, multisequence MR  imaging of the lumbar spine was performed. No intravenous contrast was administered. COMPARISON:  Plain films 09/03/2016. FINDINGS: Segmentation:  Standard. Alignment:  5 mm anterolisthesis L4-5. Vertebrae: There is a subtle superior endplate compression fracture of L5 anteriorly. Bone marrow edema is also seen in L4 vertebral body inferiorly. Conus medullaris: Extends to the L1 level and appears normal. Paraspinal and other soft tissues: There is a large hematoma in the RIGHT iliacus muscle. This is incompletely evaluated on axial images, as its inferior extent is not clearly identified, but cross-sectional measurements on image 35 are 25 x 39 mm. Disc levels: L1-L2:  Normal. L2-L3:  Normal. L3-L4:  Normal. L4-L5: 5 mm anterolisthesis is facet mediated. There is marked posterior element hypertrophy. There is a large disc extrusion with a cephalad migrated free fragment. Severe spinal stenosis is present. BILATERAL subarticular zone and foraminal zone narrowing affect the L4 and L5 nerve roots. Superimposed LEFT lateral extrusion at L4-5 in the extraforaminal compartment was present previously. L5-S1: Unremarkable disc space. Facet arthropathy. No impingement. IMPRESSION: Constellation of findings suggesting a minor posttraumatic superior endplate fracture at L5. There is also slight bone marrow edema noted inferiorly at L4, which could be due to the recent fall. Recommend CT lumbar spine without contrast to look for an occult fracture of the posterior elements. 5 mm anterolisthesis at L4-5 in conjunction with posterior element hypertrophy and a large extruded disc fragment contribute to severe spinal stenosis and BILATERAL L4 and L5 nerve root impingement. The stenosis and neural impingement have significantly progressed from prior MR of 06/08/2015. Large RIGHT iliacus hematoma? This too is likely posttraumatic although in the setting of history of malignant melanoma, a metastasis cannot completely be excluded.  Recommend CT pelvis with contrast, for further evaluation. Electronically Signed   By: Staci Righter M.D.   On: 09/08/2016 18:54    Procedures Procedures (including critical care time)  Medications Ordered in ED Medications  sodium chloride 0.9 % bolus 500 mL (0 mLs Intravenous Stopped 09/08/16 2140)  fentaNYL (SUBLIMAZE) injection 50 mcg (50 mcg Intravenous Given 09/08/16 1952)  piperacillin-tazobactam (ZOSYN) IVPB 3.375 g (0 g Intravenous Stopped 09/08/16 2218)  vancomycin (VANCOCIN) IVPB 1000 mg/200 mL premix (0 mg Intravenous Stopped 09/08/16 2320)     Initial Impression / Assessment and Plan / ED Course  I have reviewed the triage vital signs and the nursing notes.  Pertinent labs & imaging results that were available during my care of the patient were reviewed by me and considered in my  medical decision making (see chart for details).     Patient presents with worsening chronic back pain with known herniated disc. He also has reported numbness of his lower extremities and increased weakness. He also seems to have generalized weakness. He was noted to be markedly tachycardic on arrival. He's had reported fevers at home. He initially had an MRI of his lumbar spine which shows markedly worsening herniated disc protrusion with bilateral nerve impingement. There also is concerned of an L5 fracture as well has a possible hematoma/fluid collection along his iliopsoas muscle. Radiologist recommended CT imaging of his spine and pelvis. This was performed which shows a questionable compression fracture of L5 but also a large fluid collection along the iliopsoas muscle which looks suspicious for abscess. There is also extension into the right hip joint. Given his tachycardia with confusion and reported fevers, he was given broad-spectrum antibiotics and IV fluids. His lactate is negative. He said no episodes of hypotension. Dr. Ronnald Ramp with neurosurgery has consulted on the patient and is requesting an  MRI of his lumbar spine with contrast to assess for epidural extension of the infection. At this point he is not a surgical candidate on his back until the infection has cleared. He recommends IR drainage of the fluid collection which can be sent for culture. I spoke with the hospitalist, Dr. Hal Hope who will admit the patient for further treatment.  I also spoke with Dr. Erlinda Hong with [facility] who will see the pt in the am.  HE ADVISED TO HOLD ANY BLOOD THINNERS PENDING POSSIBLE PROCEDURE IN THE AM.  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

## 2016-09-09 ENCOUNTER — Inpatient Hospital Stay (HOSPITAL_COMMUNITY): Payer: BLUE CROSS/BLUE SHIELD

## 2016-09-09 ENCOUNTER — Encounter (HOSPITAL_COMMUNITY): Payer: Self-pay | Admitting: Internal Medicine

## 2016-09-09 DIAGNOSIS — A419 Sepsis, unspecified organism: Secondary | ICD-10-CM | POA: Diagnosis not present

## 2016-09-09 DIAGNOSIS — M48061 Spinal stenosis, lumbar region without neurogenic claudication: Secondary | ICD-10-CM | POA: Diagnosis present

## 2016-09-09 DIAGNOSIS — T8459XA Infection and inflammatory reaction due to other internal joint prosthesis, initial encounter: Secondary | ICD-10-CM | POA: Diagnosis not present

## 2016-09-09 DIAGNOSIS — A4901 Methicillin susceptible Staphylococcus aureus infection, unspecified site: Secondary | ICD-10-CM | POA: Diagnosis not present

## 2016-09-09 DIAGNOSIS — F17210 Nicotine dependence, cigarettes, uncomplicated: Secondary | ICD-10-CM | POA: Diagnosis present

## 2016-09-09 DIAGNOSIS — M4316 Spondylolisthesis, lumbar region: Secondary | ICD-10-CM | POA: Diagnosis not present

## 2016-09-09 DIAGNOSIS — Z96649 Presence of unspecified artificial hip joint: Secondary | ICD-10-CM | POA: Diagnosis not present

## 2016-09-09 DIAGNOSIS — M545 Low back pain: Secondary | ICD-10-CM | POA: Diagnosis not present

## 2016-09-09 DIAGNOSIS — E871 Hypo-osmolality and hyponatremia: Secondary | ICD-10-CM | POA: Diagnosis present

## 2016-09-09 DIAGNOSIS — L0291 Cutaneous abscess, unspecified: Secondary | ICD-10-CM | POA: Diagnosis not present

## 2016-09-09 DIAGNOSIS — A4101 Sepsis due to Methicillin susceptible Staphylococcus aureus: Secondary | ICD-10-CM | POA: Diagnosis not present

## 2016-09-09 DIAGNOSIS — Z79899 Other long term (current) drug therapy: Secondary | ICD-10-CM | POA: Diagnosis not present

## 2016-09-09 DIAGNOSIS — G8929 Other chronic pain: Secondary | ICD-10-CM | POA: Diagnosis present

## 2016-09-09 DIAGNOSIS — K5900 Constipation, unspecified: Secondary | ICD-10-CM | POA: Diagnosis present

## 2016-09-09 DIAGNOSIS — M549 Dorsalgia, unspecified: Secondary | ICD-10-CM | POA: Diagnosis present

## 2016-09-09 DIAGNOSIS — Y831 Surgical operation with implant of artificial internal device as the cause of abnormal reaction of the patient, or of later complication, without mention of misadventure at the time of the procedure: Secondary | ICD-10-CM | POA: Diagnosis present

## 2016-09-09 DIAGNOSIS — Z85820 Personal history of malignant melanoma of skin: Secondary | ICD-10-CM | POA: Diagnosis not present

## 2016-09-09 DIAGNOSIS — K6812 Psoas muscle abscess: Secondary | ICD-10-CM | POA: Diagnosis present

## 2016-09-09 DIAGNOSIS — M5126 Other intervertebral disc displacement, lumbar region: Secondary | ICD-10-CM

## 2016-09-09 DIAGNOSIS — N400 Enlarged prostate without lower urinary tract symptoms: Secondary | ICD-10-CM | POA: Diagnosis present

## 2016-09-09 DIAGNOSIS — E785 Hyperlipidemia, unspecified: Secondary | ICD-10-CM | POA: Diagnosis not present

## 2016-09-09 DIAGNOSIS — G9340 Encephalopathy, unspecified: Secondary | ICD-10-CM | POA: Diagnosis present

## 2016-09-09 DIAGNOSIS — Z471 Aftercare following joint replacement surgery: Secondary | ICD-10-CM | POA: Diagnosis not present

## 2016-09-09 DIAGNOSIS — E876 Hypokalemia: Secondary | ICD-10-CM | POA: Diagnosis present

## 2016-09-09 DIAGNOSIS — E784 Other hyperlipidemia: Secondary | ICD-10-CM | POA: Diagnosis not present

## 2016-09-09 DIAGNOSIS — N179 Acute kidney failure, unspecified: Secondary | ICD-10-CM | POA: Diagnosis not present

## 2016-09-09 DIAGNOSIS — Z79891 Long term (current) use of opiate analgesic: Secondary | ICD-10-CM | POA: Diagnosis not present

## 2016-09-09 DIAGNOSIS — M4856XA Collapsed vertebra, not elsewhere classified, lumbar region, initial encounter for fracture: Secondary | ICD-10-CM | POA: Diagnosis present

## 2016-09-09 DIAGNOSIS — A4902 Methicillin resistant Staphylococcus aureus infection, unspecified site: Secondary | ICD-10-CM | POA: Diagnosis not present

## 2016-09-09 DIAGNOSIS — K651 Peritoneal abscess: Secondary | ICD-10-CM | POA: Diagnosis not present

## 2016-09-09 DIAGNOSIS — T8459XS Infection and inflammatory reaction due to other internal joint prosthesis, sequela: Secondary | ICD-10-CM | POA: Diagnosis not present

## 2016-09-09 DIAGNOSIS — B9561 Methicillin susceptible Staphylococcus aureus infection as the cause of diseases classified elsewhere: Secondary | ICD-10-CM | POA: Diagnosis present

## 2016-09-09 DIAGNOSIS — S3992XA Unspecified injury of lower back, initial encounter: Secondary | ICD-10-CM | POA: Diagnosis not present

## 2016-09-09 DIAGNOSIS — N171 Acute kidney failure with acute cortical necrosis: Secondary | ICD-10-CM | POA: Diagnosis not present

## 2016-09-09 DIAGNOSIS — I10 Essential (primary) hypertension: Secondary | ICD-10-CM | POA: Diagnosis not present

## 2016-09-09 DIAGNOSIS — Z96641 Presence of right artificial hip joint: Secondary | ICD-10-CM | POA: Diagnosis not present

## 2016-09-09 DIAGNOSIS — E860 Dehydration: Secondary | ICD-10-CM | POA: Diagnosis present

## 2016-09-09 DIAGNOSIS — T8451XA Infection and inflammatory reaction due to internal right hip prosthesis, initial encounter: Secondary | ICD-10-CM | POA: Diagnosis not present

## 2016-09-09 LAB — CBC WITH DIFFERENTIAL/PLATELET
BASOS ABS: 0 10*3/uL (ref 0.0–0.1)
BASOS PCT: 0 %
Eosinophils Absolute: 0 10*3/uL (ref 0.0–0.7)
Eosinophils Relative: 0 %
HEMATOCRIT: 36 % — AB (ref 39.0–52.0)
HEMOGLOBIN: 11.9 g/dL — AB (ref 13.0–17.0)
LYMPHS PCT: 6 %
Lymphs Abs: 0.8 10*3/uL (ref 0.7–4.0)
MCH: 31.3 pg (ref 26.0–34.0)
MCHC: 33.1 g/dL (ref 30.0–36.0)
MCV: 94.7 fL (ref 78.0–100.0)
Monocytes Absolute: 0.7 10*3/uL (ref 0.1–1.0)
Monocytes Relative: 5 %
NEUTROS ABS: 12.9 10*3/uL — AB (ref 1.7–7.7)
NEUTROS PCT: 89 %
Platelets: 235 10*3/uL (ref 150–400)
RBC: 3.8 MIL/uL — AB (ref 4.22–5.81)
RDW: 13 % (ref 11.5–15.5)
WBC: 14.4 10*3/uL — ABNORMAL HIGH (ref 4.0–10.5)

## 2016-09-09 LAB — COMPREHENSIVE METABOLIC PANEL
ALBUMIN: 2.3 g/dL — AB (ref 3.5–5.0)
ALK PHOS: 72 U/L (ref 38–126)
ALT: 31 U/L (ref 17–63)
AST: 41 U/L (ref 15–41)
Anion gap: 11 (ref 5–15)
BILIRUBIN TOTAL: 1 mg/dL (ref 0.3–1.2)
BUN: 39 mg/dL — AB (ref 6–20)
CO2: 25 mmol/L (ref 22–32)
CREATININE: 1.19 mg/dL (ref 0.61–1.24)
Calcium: 8.9 mg/dL (ref 8.9–10.3)
Chloride: 97 mmol/L — ABNORMAL LOW (ref 101–111)
GFR calc Af Amer: >60 mL/min (ref >60)
GLUCOSE: 114 mg/dL — AB (ref 65–99)
POTASSIUM: 3.7 mmol/L (ref 3.5–5.1)
Sodium: 133 mmol/L — ABNORMAL LOW (ref 135–145)
TOTAL PROTEIN: 6.6 g/dL (ref 6.5–8.1)

## 2016-09-09 LAB — GLUCOSE, CAPILLARY
GLUCOSE-CAPILLARY: 120 mg/dL — AB (ref 65–99)
GLUCOSE-CAPILLARY: 118 mg/dL — AB (ref 65–99)
Glucose-Capillary: 127 mg/dL — ABNORMAL HIGH (ref 65–99)

## 2016-09-09 LAB — HIV ANTIBODY (ROUTINE TESTING W REFLEX): HIV Screen 4th Generation wRfx: NONREACTIVE

## 2016-09-09 LAB — PROCALCITONIN: Procalcitonin: 1.6 ng/mL

## 2016-09-09 MED ORDER — DIAZEPAM 5 MG PO TABS
5.0000 mg | ORAL_TABLET | Freq: Three times a day (TID) | ORAL | Status: DC | PRN
  Administered 2016-09-09: 5 mg via ORAL
  Filled 2016-09-09: qty 1

## 2016-09-09 MED ORDER — SIMVASTATIN 20 MG PO TABS
20.0000 mg | ORAL_TABLET | Freq: Every day | ORAL | Status: DC
  Administered 2016-09-09 – 2016-09-15 (×8): 20 mg via ORAL
  Filled 2016-09-09 (×8): qty 1

## 2016-09-09 MED ORDER — TEMAZEPAM 15 MG PO CAPS
30.0000 mg | ORAL_CAPSULE | Freq: Every day | ORAL | Status: DC
  Administered 2016-09-09 – 2016-09-14 (×7): 30 mg via ORAL
  Filled 2016-09-09 (×7): qty 2

## 2016-09-09 MED ORDER — FENTANYL CITRATE (PF) 100 MCG/2ML IJ SOLN
INTRAMUSCULAR | Status: AC | PRN
  Administered 2016-09-09 (×6): 25 ug via INTRAVENOUS

## 2016-09-09 MED ORDER — ACETAMINOPHEN 325 MG PO TABS
650.0000 mg | ORAL_TABLET | Freq: Four times a day (QID) | ORAL | Status: DC | PRN
  Administered 2016-09-14: 650 mg via ORAL
  Filled 2016-09-09: qty 2

## 2016-09-09 MED ORDER — GADOBENATE DIMEGLUMINE 529 MG/ML IV SOLN
20.0000 mL | Freq: Once | INTRAVENOUS | Status: AC
  Administered 2016-09-09: 20 mL via INTRAVENOUS

## 2016-09-09 MED ORDER — FENTANYL CITRATE (PF) 100 MCG/2ML IJ SOLN
INTRAMUSCULAR | Status: AC
  Filled 2016-09-09: qty 4

## 2016-09-09 MED ORDER — MIDAZOLAM HCL 2 MG/2ML IJ SOLN
INTRAMUSCULAR | Status: AC | PRN
  Administered 2016-09-09 (×2): 1 mg via INTRAVENOUS
  Administered 2016-09-09: 0.5 mg via INTRAVENOUS

## 2016-09-09 MED ORDER — VANCOMYCIN HCL IN DEXTROSE 1-5 GM/200ML-% IV SOLN
1000.0000 mg | Freq: Two times a day (BID) | INTRAVENOUS | Status: DC
  Administered 2016-09-09 – 2016-09-11 (×5): 1000 mg via INTRAVENOUS
  Filled 2016-09-09 (×5): qty 200

## 2016-09-09 MED ORDER — MORPHINE SULFATE (PF) 4 MG/ML IV SOLN
2.0000 mg | INTRAVENOUS | Status: DC | PRN
  Administered 2016-09-09 – 2016-09-10 (×6): 2 mg via INTRAVENOUS
  Filled 2016-09-09 (×7): qty 1

## 2016-09-09 MED ORDER — ONDANSETRON HCL 4 MG PO TABS: 4.0000 mg | ORAL_TABLET | Freq: Four times a day (QID) | ORAL | Status: DC | PRN

## 2016-09-09 MED ORDER — ACETAMINOPHEN 650 MG RE SUPP: 650.0000 mg | Freq: Four times a day (QID) | RECTAL | Status: DC | PRN

## 2016-09-09 MED ORDER — LIDOCAINE HCL (PF) 1 % IJ SOLN
INTRAMUSCULAR | Status: AC
  Filled 2016-09-09: qty 30

## 2016-09-09 MED ORDER — METOPROLOL SUCCINATE ER 25 MG PO TB24
50.0000 mg | ORAL_TABLET | Freq: Every day | ORAL | Status: DC
  Administered 2016-09-09 – 2016-09-15 (×7): 50 mg via ORAL
  Filled 2016-09-09: qty 1
  Filled 2016-09-09: qty 2
  Filled 2016-09-09 (×2): qty 1
  Filled 2016-09-09: qty 2
  Filled 2016-09-09: qty 1
  Filled 2016-09-09: qty 2

## 2016-09-09 MED ORDER — ONDANSETRON HCL 4 MG/2ML IJ SOLN
4.0000 mg | Freq: Four times a day (QID) | INTRAMUSCULAR | Status: DC | PRN
  Administered 2016-09-09: 4 mg via INTRAVENOUS
  Filled 2016-09-09 (×2): qty 2

## 2016-09-09 MED ORDER — DEXTROSE 5 % IV SOLN
500.0000 mg | Freq: Four times a day (QID) | INTRAVENOUS | Status: DC | PRN
  Filled 2016-09-09: qty 5

## 2016-09-09 MED ORDER — SODIUM CHLORIDE 0.9 % IV SOLN
INTRAVENOUS | Status: AC
  Administered 2016-09-09 (×2): via INTRAVENOUS

## 2016-09-09 MED ORDER — SODIUM CHLORIDE 0.9 % IV SOLN
INTRAVENOUS | Status: AC | PRN
  Administered 2016-09-09: 10 mL/h via INTRAVENOUS

## 2016-09-09 MED ORDER — VANCOMYCIN HCL IN DEXTROSE 750-5 MG/150ML-% IV SOLN
750.0000 mg | Freq: Two times a day (BID) | INTRAVENOUS | Status: DC
  Filled 2016-09-09: qty 150

## 2016-09-09 MED ORDER — PIPERACILLIN-TAZOBACTAM 3.375 G IVPB
3.3750 g | Freq: Three times a day (TID) | INTRAVENOUS | Status: DC
  Administered 2016-09-09 – 2016-09-11 (×8): 3.375 g via INTRAVENOUS
  Filled 2016-09-09 (×10): qty 50

## 2016-09-09 MED ORDER — MIDAZOLAM HCL 2 MG/2ML IJ SOLN
INTRAMUSCULAR | Status: AC
  Filled 2016-09-09: qty 4

## 2016-09-09 MED ORDER — ORAL CARE MOUTH RINSE
15.0000 mL | Freq: Two times a day (BID) | OROMUCOSAL | Status: DC
  Administered 2016-09-09 – 2016-09-14 (×11): 15 mL via OROMUCOSAL

## 2016-09-09 NOTE — H&P (Signed)
History and Physical    Jon Choi Jon Choi DOB: 04-12-1950 DOA: 09/08/2016  PCP: [name], MD  Patient coming from: Home.  Chief Complaint: Back pain.  HPI: Jon Choi is a 66 y.o. male with history of hypertension, hyperlipidemia chronic back pain malignant melanoma was brought to the ER after patient was having increasing back pain with difficulty ambulating. Patient states last week patient tripped on his dog and fell over his back. Since then patient has been a severe back pain. Patient had seen Dr. Saintclair Halsted neurosurgeon last week and has appointment for MRI as outpatient. But due to worsening pain and difficulty to ambulate patient came to the ER. Patient also has been having subjective feeling of fever or chills. Denies any incontinence of bowel or urine.  ED Course: In the ER MRI of the L-spine shows severe stenosis at L4-L5 with compression fracture at L5. CT pelvis shows right iliopsoas abscess. Neurosurgeon Dr. Ronnald Ramp has been consulted. Neurosurgeon has requested IR consult for iliopsoas abscess and MRI with contrast for ruling out epidural abscess. Patient is being admitted for further management. On arrival patient is tachycardic febrile with leukocytosis compatible with sepsis picture.  Review of Systems: As per HPI, rest all negative.   Past Medical History:  Diagnosis Date  . Back pain     History reviewed. No pertinent surgical history.   reports that he has quit smoking. He has never used smokeless tobacco. He reports that he drinks alcohol. He reports that he does not use drugs.  No Known Allergies  Family History  Problem Relation Age of Onset  . Hypertension Other     Prior to Admission medications   Medication Sig Start Date End Date Taking? Authorizing Provider  celecoxib (CELEBREX) 200 MG capsule Take 200 mg by mouth daily.  08/19/16  Yes [provider]  diazepam (VALIUM) 5 MG tablet Take 1 tablet (5 mg total) by mouth every 8  (eight) hours as needed for muscle spasms. 09/03/16  Yes Virgel Manifold, MD  metoprolol succinate (TOPROL-XL) 50 MG 24 hr tablet Take 50 mg by mouth daily.    Yes [provider]  naproxen sodium (ANAPROX) 220 MG tablet Take 440 mg by mouth daily as needed (pain).   Yes [provider]  oxyCODONE-acetaminophen (PERCOCET/ROXICET) 5-325 MG tablet Take 1-2 tablets by mouth every 6 (six) hours as needed for severe pain. Patient taking differently: Take 1 tablet by mouth every 8 (eight) hours as needed for severe pain.  09/03/16  Yes Virgel Manifold, MD  simvastatin (ZOCOR) 20 MG tablet Take 20 mg by mouth daily.   Yes [provider]  temazepam (RESTORIL) 30 MG capsule Take 30 mg by mouth at bedtime.  08/24/16  Yes [provider]  tiZANidine (ZANAFLEX) 4 MG tablet Take 1 tablet (4 mg total) by mouth every 8 (eight) hours as needed for muscle spasms. 08/30/16  Yes Mcarthur Rossetti, MD  traMADol (ULTRAM) 50 MG tablet TAKE 1 TO 2 TABLETS BY MOUTH TWICE A DAY AS NEEDED FOR PAIN. Patient taking differently: TAKE 50MG -100MG  BY MOUTH EVERY 8 HOURS AS NEEDED FOR PAIN. 09/08/16  Yes Mcarthur Rossetti, MD  HYDROcodone-acetaminophen (NORCO) 10-325 MG tablet TAKE 1 TABLET BY MOUTH EVERY 6 HOURS AS NEEDED. Patient not taking: Reported on 09/08/2016 09/08/16   Mcarthur Rossetti, MD    Physical Exam: Vitals:   09/08/16 2300 09/08/16 2330 09/09/16 0000 09/09/16 0030  BP: 129/78 (!) 142/85 132/84 (!) 125/59  Pulse:  100 (!) 37 (!) 110 (!) 35  Resp:      Temp:      TempSrc:      SpO2: 96% 91% 94% 94%  Weight:      Height:          Constitutional: Moderately built and nourished. Vitals:   09/08/16 2300 09/08/16 2330 09/09/16 0000 09/09/16 0030  BP: 129/78 (!) 142/85 132/84 (!) 125/59  Pulse: 100 (!) 37 (!) 110 (!) 35  Resp:      Temp:      TempSrc:      SpO2: 96% 91% 94% 94%  Weight:      Height:       Eyes: Anicteric no pallor. ENMT: No discharge  from the ears eyes nose and mouth. Neck: No mass felt. No neck rigidity. Respiratory: No rhonchi or crepitations. Cardiovascular: S1-S2. No murmurs appreciated. Abdomen: Soft nontender bowel sounds present. Musculoskeletal: Tenderness at the low back. Pain on moving both legs. Skin: No rash. Skin appears warm. Neurologic: Alert awake oriented to time place and person. Moves all extremities. Difficult to completely evaluate due to patient's severe pain. Psychiatric: Appears normal. Normal affect.   Labs on Admission: I have personally reviewed following labs and imaging studies  CBC:  Recent Labs Lab 09/08/16 1926  WBC 14.7*  NEUTROABS 13.0*  HGB 12.7*  HCT 37.3*  MCV 94.2  PLT 101   Basic Metabolic Panel:  Recent Labs Lab 09/08/16 1926  NA 130*  K 3.9  CL 94*  CO2 24  GLUCOSE 140*  BUN 39*  CREATININE 1.63*  CALCIUM 9.5   GFR: Estimated Creatinine Clearance: 50 mL/min (A) (by C-G formula based on SCr of 1.63 mg/dL (H)). Liver Function Tests:  Recent Labs Lab 09/08/16 1926  AST 47*  ALT 35  ALKPHOS 73  BILITOT 1.2  PROT 6.9  ALBUMIN 2.7*   No results for input(s): LIPASE, AMYLASE in the last 168 hours. No results for input(s): AMMONIA in the last 168 hours. Coagulation Profile: No results for input(s): INR, PROTIME in the last 168 hours. Cardiac Enzymes: No results for input(s): CKTOTAL, CKMB, CKMBINDEX, TROPONINI in the last 168 hours. BNP (last 3 results) No results for input(s): PROBNP in the last 8760 hours. HbA1C: No results for input(s): HGBA1C in the last 72 hours. CBG:  Recent Labs Lab 09/08/16 1713  GLUCAP 134*   Lipid Profile: No results for input(s): CHOL, HDL, LDLCALC, TRIG, CHOLHDL, LDLDIRECT in the last 72 hours. Thyroid Function Tests: No results for input(s): TSH, T4TOTAL, FREET4, T3FREE, THYROIDAB in the last 72 hours. Anemia Panel: No results for input(s): VITAMINB12, FOLATE, FERRITIN, TIBC, IRON, RETICCTPCT in the last 72  hours. Urine analysis:    Component Value Date/Time   COLORURINE AMBER (A) 09/08/2016 1934   APPEARANCEUR HAZY (A) 09/08/2016 1934   LABSPEC 1.025 09/08/2016 1934   PHURINE 5.0 09/08/2016 1934   GLUCOSEU NEGATIVE 09/08/2016 1934   HGBUR MODERATE (A) 09/08/2016 1934   BILIRUBINUR NEGATIVE 09/08/2016 1934   KETONESUR NEGATIVE 09/08/2016 1934   PROTEINUR 100 (A) 09/08/2016 1934   UROBILINOGEN 0.2 05/25/2007 1944   NITRITE NEGATIVE 09/08/2016 1934   LEUKOCYTESUR NEGATIVE 09/08/2016 1934   Sepsis Labs: @LABRCNTIP (procalcitonin:4,lacticidven:4) )No results found for this or any previous visit (from the past 240 hour(s)).   Radiological Exams on Admission: Dg Chest 2 View  Result Date: 09/08/2016 CLINICAL DATA:  Shortness of Breath EXAM: CHEST  2 VIEW COMPARISON:  05/25/2007 FINDINGS: The heart size and mediastinal contours are  within normal limits. Both lungs are clear. The visualized skeletal structures are unremarkable. IMPRESSION: No active cardiopulmonary disease. Electronically Signed   By: Inez Catalina M.D.   On: 09/08/2016 18:19   Ct Head Wo Contrast  Result Date: 09/08/2016 CLINICAL DATA:  Neck pain, weakness and confusion.  Fell 1 week ago. EXAM: CT HEAD WITHOUT CONTRAST CT CERVICAL SPINE WITHOUT CONTRAST TECHNIQUE: Multidetector CT imaging of the head and cervical spine was performed following the standard protocol without intravenous contrast. Multiplanar CT image reconstructions of the cervical spine were also generated. COMPARISON:  None. FINDINGS: CT HEAD FINDINGS Brain: Diffusely enlarged ventricles and subarachnoid spaces. No intracranial hemorrhage, mass lesion or CT evidence of acute infarction. Vascular: No hyperdense vessel or unexpected calcification. Skull: Normal. Negative for fracture or focal lesion. Sinuses/Orbits: Mild bilateral inferior frontal and anterior ethmoid sinus mucosal thickening. Unremarkable orbits. Other: None. CT CERVICAL SPINE FINDINGS Alignment:  Reversal of the normal cervical lordosis. Mild anterolisthesis at the C3-4 and C4-5 levels. Skull base and vertebrae: No acute fracture. No primary bone lesion or focal pathologic process. Soft tissues and spinal canal: No prevertebral fluid or swelling. No visible canal hematoma. Disc levels: Multilevel degenerative changes. These include facet degenerative changes throughout the cervical spine. Upper chest: Mild biapical pleural and parenchymal scarring. Other: Bilateral carotid artery calcifications. IMPRESSION: 1. No skull fracture or intracranial hemorrhage. 2. No cervical spine fracture or traumatic subluxation. 3. Mild diffuse cerebral atrophy. 4. Multilevel cervical spine degenerative changes. These include facet degenerative changes with associated mild anterolisthesis at the C3-4 C4-5 levels. 5. Dense bilateral carotid artery atheromatous calcifications. 6. Minimal chronic bilateral frontal and bilateral ethmoid sinusitis. Electronically Signed   By: Claudie Revering M.D.   On: 09/08/2016 18:08   Ct Cervical Spine Wo Contrast  Result Date: 09/08/2016 CLINICAL DATA:  Neck pain, weakness and confusion.  Fell 1 week ago. EXAM: CT HEAD WITHOUT CONTRAST CT CERVICAL SPINE WITHOUT CONTRAST TECHNIQUE: Multidetector CT imaging of the head and cervical spine was performed following the standard protocol without intravenous contrast. Multiplanar CT image reconstructions of the cervical spine were also generated. COMPARISON:  None. FINDINGS: CT HEAD FINDINGS Brain: Diffusely enlarged ventricles and subarachnoid spaces. No intracranial hemorrhage, mass lesion or CT evidence of acute infarction. Vascular: No hyperdense vessel or unexpected calcification. Skull: Normal. Negative for fracture or focal lesion. Sinuses/Orbits: Mild bilateral inferior frontal and anterior ethmoid sinus mucosal thickening. Unremarkable orbits. Other: None. CT CERVICAL SPINE FINDINGS Alignment: Reversal of the normal cervical lordosis. Mild  anterolisthesis at the C3-4 and C4-5 levels. Skull base and vertebrae: No acute fracture. No primary bone lesion or focal pathologic process. Soft tissues and spinal canal: No prevertebral fluid or swelling. No visible canal hematoma. Disc levels: Multilevel degenerative changes. These include facet degenerative changes throughout the cervical spine. Upper chest: Mild biapical pleural and parenchymal scarring. Other: Bilateral carotid artery calcifications. IMPRESSION: 1. No skull fracture or intracranial hemorrhage. 2. No cervical spine fracture or traumatic subluxation. 3. Mild diffuse cerebral atrophy. 4. Multilevel cervical spine degenerative changes. These include facet degenerative changes with associated mild anterolisthesis at the C3-4 C4-5 levels. 5. Dense bilateral carotid artery atheromatous calcifications. 6. Minimal chronic bilateral frontal and bilateral ethmoid sinusitis. Electronically Signed   By: Claudie Revering M.D.   On: 09/08/2016 18:08   Ct Lumbar Spine Wo Contrast  Result Date: 09/08/2016 CLINICAL DATA:  Chronic low back pain and left leg numbness history of bone spurs EXAM: CT LUMBAR SPINE WITHOUT CONTRAST TECHNIQUE: Multidetector CT imaging of  the lumbar spine was performed without intravenous contrast administration. Multiplanar CT image reconstructions were also generated. COMPARISON:  MRI 09/08/2016, radiograph 09/03/2016 FINDINGS: Segmentation: 5 lumbar type vertebrae. Alignment: Grade 1 anterolisthesis of L4 on L5. Lumbar alignment otherwise within normal limits. Vertebrae: Mild irregularity anterior superior endplate at L5 corresponding to MRI fracture. Paraspinal and other soft tissues: Aortic atherosclerosis. Partially visualized hypodense mass, likely intramuscular within the right pelvis. Disc levels: At T12-L1, no significant canal stenosis or disc disease. The foramen are patent bilaterally. At L2-L3, no significant disc disease or canal stenosis. The foramen are patent  bilaterally. At L2-L3, no significant disc disease or canal stenosis. No significant canal stenosis or bony foraminal narrowing. Posterior facet arthropathy At L3-L4, no significant disc disease or canal stenosis. No significant bony foraminal narrowing. Posterior facet arthropathy At L4-L5, mild disc space narrowing with vacuum disc. Moderate to large disc extrusion with marked canal stenosis, and lateral extension of disc material. Bilateral facet hypertrophy. At L5-S1, no significant canal stenosis or focal disc disease. The foramen are patent bilaterally. IMPRESSION: 1. Minimal irregularity anterior superior endplate at L5, corresponding to subtle compression fracture noted on comparison MRI. L4 posttraumatic changes on MRI not well visualized by CT. No fracture of the posterior elements. 2. 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 Bilateral foraminal impingement. See MRI report. 3. Partially visualized hypodense mass within the right iliacus. Uncertain if this represents a hematoma or soft tissue mass. There is some edema and soft tissue stranding around the right psoas muscle, further evaluation with pelvic CT could be obtained if desired. Electronically Signed   By: Donavan Foil M.D.   On: 09/08/2016 21:11   Ct Pelvis Wo Contrast  Result Date: 09/08/2016 CLINICAL DATA:  Chronic low back pain and left leg numbness. Recent diagnosis of bone spurs in the lumbar region. EXAM: CT PELVIS WITHOUT CONTRAST TECHNIQUE: Multidetector CT imaging of the pelvis was performed following the standard protocol without intravenous contrast. COMPARISON:  None. FINDINGS: Urinary Tract: Bladder wall is not thickened and no filling defects are identified. Bowel: Visualized portions of colon and small bowel are not abnormally distended. Stool-filled colon. Appendix is normal. Vascular/Lymphatic: Calcifications in the aorta and iliac  vessels. No aneurysm. Reproductive: Prostate gland is enlarged, measuring about 4.8 cm diameter. Other: There is infiltration along the right iliopsoas margin with a low-attenuation expansile focus demonstrated in the iliacus muscle measuring about 2.7 x 4 cm in diameter. Appearance is suspicious for any iliopsoas abscess with adjacent inflammatory stranding. Visualization of the area around the right hip is limited due to streak artifact from right hip arthroplasty, but there appears to be small air-fluid levels with fluid collection anterior to the right hip which probably represents extension of the abscess. Musculoskeletal: Postoperative changes with right total hip arthroplasty. Visualized components appear well seated although visualization is limited due to streak artifact. No evidence of acute fracture or dislocation as visualized. SI joints and symphysis pubis are not displaced. Visualized sacrum and pelvis appear intact. Degenerative changes noted in the lower lumbar spine and in the left hip. IMPRESSION: 1. Inflammatory infiltration in around the right iliopsoas muscle with loculated iliacus muscle abscess and probable extension of the abscess anterior to the right hip arthroplasty. 2. Right total hip arthroplasty. 3. No acute bony abnormalities identified. 4. Degenerative changes in the lower lumbar spine and left hip. Electronically Signed   By: Oren Beckmann.D.  On: 09/08/2016 20:58   Mr Lumbar Spine Wo Contrast  Result Date: 09/08/2016 CLINICAL DATA:  Patient fell 2 days ago, increasing LEFT leg pain and weakness. EXAM: MRI LUMBAR SPINE WITHOUT CONTRAST TECHNIQUE: Multiplanar, multisequence MR imaging of the lumbar spine was performed. No intravenous contrast was administered. COMPARISON:  Plain films 09/03/2016. FINDINGS: Segmentation:  Standard. Alignment:  5 mm anterolisthesis L4-5. Vertebrae: There is a subtle superior endplate compression fracture of L5 anteriorly. Bone marrow edema is  also seen in L4 vertebral body inferiorly. Conus medullaris: Extends to the L1 level and appears normal. Paraspinal and other soft tissues: There is a large hematoma in the RIGHT iliacus muscle. This is incompletely evaluated on axial images, as its inferior extent is not clearly identified, but cross-sectional measurements on image 35 are 25 x 39 mm. Disc levels: L1-L2:  Normal. L2-L3:  Normal. L3-L4:  Normal. L4-L5: 5 mm anterolisthesis is facet mediated. There is marked posterior element hypertrophy. There is a large disc extrusion with a cephalad migrated free fragment. Severe spinal stenosis is present. BILATERAL subarticular zone and foraminal zone narrowing affect the L4 and L5 nerve roots. Superimposed LEFT lateral extrusion at L4-5 in the extraforaminal compartment was present previously. L5-S1: Unremarkable disc space. Facet arthropathy. No impingement. IMPRESSION: Constellation of findings suggesting a minor posttraumatic superior endplate fracture at L5. There is also slight bone marrow edema noted inferiorly at L4, which could be due to the recent fall. Recommend CT lumbar spine without contrast to look for an occult fracture of the posterior elements. 5 mm anterolisthesis at L4-5 in conjunction with posterior element hypertrophy and a large extruded disc fragment contribute to severe spinal stenosis and BILATERAL L4 and L5 nerve root impingement. The stenosis and neural impingement have significantly progressed from prior MR of 06/08/2015. Large RIGHT iliacus hematoma? This too is likely posttraumatic although in the setting of history of malignant melanoma, a metastasis cannot completely be excluded. Recommend CT pelvis with contrast, for further evaluation. Electronically Signed   By: Staci Righter M.D.   On: 09/08/2016 18:54    EKG: Independently reviewed. Sinus tachycardia with APCs.  Assessment/Plan Principal Problem:   Sepsis ([place]) Active Problems:   HLD (hyperlipidemia)    Hypertension   Iliopsoas abscess ([place])   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-L5 level   ARF (acute renal failure) ([place])    1. Sepsis likely source is iliopsoas abscess - MRI L-spine with contrast has been ordered and is pending to rule out epidural abscess. Blood cultures are obtained. Patient is on vancomycin and Zosyn. Will need infectious disease consult. Intervention radiologist has been consulted for drainage of iliopsoas abscess. 2. Severe spinal stenosis at L4-L5 with compression fracture - neurosurgery following. Pain relief medications and muscle relaxants. 3. Hypertension on metoprolol. 4. Hyperlipidemia on statins. 5. Acute renal failure likely from poor oral intake - follow UA. Continue hydration. 6. Hyperlipidemia on statins. 7. History of malignant melanoma.   DVT prophylaxis: SCDs in anticipation of procedure.  Code Status: Full code.  Family Communication: Patient's wife.  Disposition Plan: To be determined.  Consults called: Neurosurgery.  Admission status: Inpatient.    Rise Patience MD [facility] Pager [phone].  If 7PM-7AM, please contact night-coverage [email or web address] Password [email or web address]  09/09/2016, 1:04 AM

## 2016-09-09 NOTE — ED Notes (Signed)
Waiting on room assignment

## 2016-09-09 NOTE — Progress Notes (Signed)
Pharmacy Antibiotic Note  Jon Choi is a 66 y.o. male admitted on 09/08/2016 with sepsis.  Pharmacy has been consulted for Vancocin and Zosyn dosing.  Plan: Vanc 1g and Zosyn 3.375g given in ED. Vancomycin 750mg  IV every 12 hours.  Goal trough 15-20 mcg/mL. Zosyn 3.375g IV q8h (4 hour infusion).  Height: 6\' 1"  (185.4 cm) Weight: 168 lb (76.2 kg) IBW/kg (Calculated) : 79.9  Temp (24hrs), Avg:99 F (37.2 C), Min:98.2 F (36.8 C), Max:99.8 F (37.7 C)   Recent Labs Lab 09/08/16 1926 09/08/16 1936  WBC 14.7*  --   CREATININE 1.63*  --   LATICACIDVEN  --  1.48    Estimated Creatinine Clearance: 50 mL/min (A) (by C-G formula based on SCr of 1.63 mg/dL (H)).    No Known Allergies   Thank you for allowing pharmacy to be a part of this patient's care.  Wynona Neat, PharmD, BCPS  09/09/2016 1:06 AM

## 2016-09-09 NOTE — Progress Notes (Signed)
PROGRESS NOTE    Jon Choi   YKD:[id number]  DOB: Apr 07, 1950  DOA: 09/08/2016 PCP: [name], MD   Brief Narrative:  Jon Choi 66 y.o. male with history of hypertension, hyperlipidemia chronic back pain malignant melanoma was brought to the ER after patient was having increasing back pain with difficulty ambulating. Imaging reveals a right psoas abscess and severe stenosis of L4-L5 with compression fx at L5.    Subjective: Complains of back pain. Appear sleepy and is not answering some of my questions  ROS: no complaints of nausea, vomiting, constipation, diarrhea, cough, dyspnea or dysuria. No other complaints.   Assessment & Plan:   Principal Problem:   Iliopsoas abscess   Sepsis - temp 100.3, WBC 14, HR in 100s - lactic acid normal at 1.48 - IR to drain today- will follow wound cultures - Vanc and Zosym  Active Problems:   ARF (acute renal failure)  - due to sepsis, dehydration - has improved  Hyponatremia  - dehydration- continue IVF  Hypertension - Metoprolol     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-L5 level - neurosurgery notes taht he is current not a candidate for surgery with an ongoing infectious process    HLD (hyperlipidemia) - Zocor   DVT prophylaxis: SCDs Code Status: Full code Family Communication:  Disposition Plan:  Consultants:   Neurosurgery  IR Procedures:    Antimicrobials:  Anti-infectives    Start     Dose/Rate Route Frequency Ordered Stop   09/09/16 1000  vancomycin (VANCOCIN) IVPB 750 mg/150 ml premix  Status:  Discontinued     750 mg 150 mL/hr over 60 Minutes Intravenous Every 12 hours 09/09/16 0108 09/09/16 0853   09/09/16 1000  vancomycin (VANCOCIN) IVPB 1000 mg/200 mL premix     1,000 mg 200 mL/hr over 60 Minutes Intravenous Every 12 hours 09/09/16 0853     09/09/16 0400  piperacillin-tazobactam (ZOSYN) IVPB 3.375 g     3.375 g 12.5 mL/hr over 240 Minutes Intravenous Every 8 hours 09/09/16 0108     09/08/16 2115   piperacillin-tazobactam (ZOSYN) IVPB 3.375 g     3.375 g 100 mL/hr over 30 Minutes Intravenous  Once 09/08/16 2107 09/08/16 2218   09/08/16 2115  vancomycin (VANCOCIN) IVPB 1000 mg/200 mL premix     1,000 mg 200 mL/hr over 60 Minutes Intravenous  Once 09/08/16 2107 09/08/16 2320       Objective: Vitals:   09/09/16 1225 09/09/16 1230 09/09/16 1235 09/09/16 1240  BP: (!) 146/85 (!) 151/92 (!) 168/80 134/86  Pulse: (!) 129 (!) 131 (!) 131 (!) 129  Resp: 14 20 20  (!) 24  Temp:      TempSrc:      SpO2: 97% 97% 97% 97%  Weight:      Height:        Intake/Output Summary (Last 24 hours) at 09/09/16 1244 Last data filed at 09/09/16 1239  Gross per 24 hour  Intake             1180 ml  Output               30 ml  Net             1150 ml   Filed Weights   09/08/16 1527 09/09/16 0130  Weight: 76.2 kg (168 lb) 82.1 kg (180 lb 14.4 oz)    Examination: General exam: Appears comfortable  HEENT: PERRLA, oral mucosa moist, no sclera icterus or thrush Respiratory system: Clear to auscultation. Respiratory  effort normal. Cardiovascular system: S1 & S2 heard, RRR.  No murmurs  Gastrointestinal system: Abdomen soft, non-tender, nondistended. Normal bowel sound. No organomegaly Central nervous system: Alert and oriented. No focal neurological deficits. Extremities: No cyanosis, clubbing or edema Skin: No rashes or ulcers Psychiatry:  Sleepy     Data Reviewed: I have personally reviewed following labs and imaging studies  CBC:  Recent Labs Lab 09/08/16 1926 09/09/16 0359  WBC 14.7* 14.4*  NEUTROABS 13.0* 12.9*  HGB 12.7* 11.9*  HCT 37.3* 36.0*  MCV 94.2 94.7  PLT 219 765   Basic Metabolic Panel:  Recent Labs Lab 09/08/16 1926 09/09/16 0359  NA 130* 133*  K 3.9 3.7  CL 94* 97*  CO2 24 25  GLUCOSE 140* 114*  BUN 39* 39*  CREATININE 1.63* 1.19  CALCIUM 9.5 8.9   GFR: Estimated Creatinine Clearance: 71.8 mL/min (by C-G formula based on SCr of 1.19 mg/dL). Liver  Function Tests:  Recent Labs Lab 09/08/16 1926 09/09/16 0359  AST 47* 41  ALT 35 31  ALKPHOS 73 72  BILITOT 1.2 1.0  PROT 6.9 6.6  ALBUMIN 2.7* 2.3*   No results for input(s): LIPASE, AMYLASE in the last 168 hours. No results for input(s): AMMONIA in the last 168 hours. Coagulation Profile: No results for input(s): INR, PROTIME in the last 168 hours. Cardiac Enzymes: No results for input(s): CKTOTAL, CKMB, CKMBINDEX, TROPONINI in the last 168 hours. BNP (last 3 results) No results for input(s): PROBNP in the last 8760 hours. HbA1C: No results for input(s): HGBA1C in the last 72 hours. CBG:  Recent Labs Lab 09/08/16 1713 09/09/16 0138 09/09/16 0606  GLUCAP 134* 120* 118*   Lipid Profile: No results for input(s): CHOL, HDL, LDLCALC, TRIG, CHOLHDL, LDLDIRECT in the last 72 hours. Thyroid Function Tests: No results for input(s): TSH, T4TOTAL, FREET4, T3FREE, THYROIDAB in the last 72 hours. Anemia Panel: No results for input(s): VITAMINB12, FOLATE, FERRITIN, TIBC, IRON, RETICCTPCT in the last 72 hours. Urine analysis:    Component Value Date/Time   COLORURINE AMBER (A) 09/08/2016 1934   APPEARANCEUR HAZY (A) 09/08/2016 1934   LABSPEC 1.025 09/08/2016 1934   PHURINE 5.0 09/08/2016 1934   GLUCOSEU NEGATIVE 09/08/2016 1934   HGBUR MODERATE (A) 09/08/2016 1934   BILIRUBINUR NEGATIVE 09/08/2016 1934   KETONESUR NEGATIVE 09/08/2016 1934   PROTEINUR 100 (A) 09/08/2016 1934   UROBILINOGEN 0.2 05/25/2007 1944   NITRITE NEGATIVE 09/08/2016 1934   LEUKOCYTESUR NEGATIVE 09/08/2016 1934   Sepsis Labs: @LABRCNTIP (procalcitonin:4,lacticidven:4) )No results found for this or any previous visit (from the past 240 hour(s)).       Radiology Studies: Dg Chest 2 View  Result Date: 09/08/2016 CLINICAL DATA:  Shortness of Breath EXAM: CHEST  2 VIEW COMPARISON:  05/25/2007 FINDINGS: The heart size and mediastinal contours are within normal limits. Both lungs are clear. The  visualized skeletal structures are unremarkable. IMPRESSION: No active cardiopulmonary disease. Electronically Signed   By: Inez Catalina M.D.   On: 09/08/2016 18:19   Ct Head Wo Contrast  Result Date: 09/08/2016 CLINICAL DATA:  Neck pain, weakness and confusion.  Fell 1 week ago. EXAM: CT HEAD WITHOUT CONTRAST CT CERVICAL SPINE WITHOUT CONTRAST TECHNIQUE: Multidetector CT imaging of the head and cervical spine was performed following the standard protocol without intravenous contrast. Multiplanar CT image reconstructions of the cervical spine were also generated. COMPARISON:  None. FINDINGS: CT HEAD FINDINGS Brain: Diffusely enlarged ventricles and subarachnoid spaces. No intracranial hemorrhage, mass lesion or CT evidence of  acute infarction. Vascular: No hyperdense vessel or unexpected calcification. Skull: Normal. Negative for fracture or focal lesion. Sinuses/Orbits: Mild bilateral inferior frontal and anterior ethmoid sinus mucosal thickening. Unremarkable orbits. Other: None. CT CERVICAL SPINE FINDINGS Alignment: Reversal of the normal cervical lordosis. Mild anterolisthesis at the C3-4 and C4-5 levels. Skull base and vertebrae: No acute fracture. No primary bone lesion or focal pathologic process. Soft tissues and spinal canal: No prevertebral fluid or swelling. No visible canal hematoma. Disc levels: Multilevel degenerative changes. These include facet degenerative changes throughout the cervical spine. Upper chest: Mild biapical pleural and parenchymal scarring. Other: Bilateral carotid artery calcifications. IMPRESSION: 1. No skull fracture or intracranial hemorrhage. 2. No cervical spine fracture or traumatic subluxation. 3. Mild diffuse cerebral atrophy. 4. Multilevel cervical spine degenerative changes. These include facet degenerative changes with associated mild anterolisthesis at the C3-4 C4-5 levels. 5. Dense bilateral carotid artery atheromatous calcifications. 6. Minimal chronic bilateral  frontal and bilateral ethmoid sinusitis. Electronically Signed   By: Claudie Revering M.D.   On: 09/08/2016 18:08   Ct Cervical Spine Wo Contrast  Result Date: 09/08/2016 CLINICAL DATA:  Neck pain, weakness and confusion.  Fell 1 week ago. EXAM: CT HEAD WITHOUT CONTRAST CT CERVICAL SPINE WITHOUT CONTRAST TECHNIQUE: Multidetector CT imaging of the head and cervical spine was performed following the standard protocol without intravenous contrast. Multiplanar CT image reconstructions of the cervical spine were also generated. COMPARISON:  None. FINDINGS: CT HEAD FINDINGS Brain: Diffusely enlarged ventricles and subarachnoid spaces. No intracranial hemorrhage, mass lesion or CT evidence of acute infarction. Vascular: No hyperdense vessel or unexpected calcification. Skull: Normal. Negative for fracture or focal lesion. Sinuses/Orbits: Mild bilateral inferior frontal and anterior ethmoid sinus mucosal thickening. Unremarkable orbits. Other: None. CT CERVICAL SPINE FINDINGS Alignment: Reversal of the normal cervical lordosis. Mild anterolisthesis at the C3-4 and C4-5 levels. Skull base and vertebrae: No acute fracture. No primary bone lesion or focal pathologic process. Soft tissues and spinal canal: No prevertebral fluid or swelling. No visible canal hematoma. Disc levels: Multilevel degenerative changes. These include facet degenerative changes throughout the cervical spine. Upper chest: Mild biapical pleural and parenchymal scarring. Other: Bilateral carotid artery calcifications. IMPRESSION: 1. No skull fracture or intracranial hemorrhage. 2. No cervical spine fracture or traumatic subluxation. 3. Mild diffuse cerebral atrophy. 4. Multilevel cervical spine degenerative changes. These include facet degenerative changes with associated mild anterolisthesis at the C3-4 C4-5 levels. 5. Dense bilateral carotid artery atheromatous calcifications. 6. Minimal chronic bilateral frontal and bilateral ethmoid sinusitis.  Electronically Signed   By: Claudie Revering M.D.   On: 09/08/2016 18:08   Ct Lumbar Spine Wo Contrast  Result Date: 09/08/2016 CLINICAL DATA:  Chronic low back pain and left leg numbness history of bone spurs EXAM: CT LUMBAR SPINE WITHOUT CONTRAST TECHNIQUE: Multidetector CT imaging of the lumbar spine was performed without intravenous contrast administration. Multiplanar CT image reconstructions were also generated. COMPARISON:  MRI 09/08/2016, radiograph 09/03/2016 FINDINGS: Segmentation: 5 lumbar type vertebrae. Alignment: Grade 1 anterolisthesis of L4 on L5. Lumbar alignment otherwise within normal limits. Vertebrae: Mild irregularity anterior superior endplate at L5 corresponding to MRI fracture. Paraspinal and other soft tissues: Aortic atherosclerosis. Partially visualized hypodense mass, likely intramuscular within the right pelvis. Disc levels: At T12-L1, no significant canal stenosis or disc disease. The foramen are patent bilaterally. At L2-L3, no significant disc disease or canal stenosis. The foramen are patent bilaterally. At L2-L3, no significant disc disease or canal stenosis. No significant canal stenosis or bony foraminal narrowing.  Posterior facet arthropathy At L3-L4, no significant disc disease or canal stenosis. No significant bony foraminal narrowing. Posterior facet arthropathy At L4-L5, mild disc space narrowing with vacuum disc. Moderate to large disc extrusion with marked canal stenosis, and lateral extension of disc material. Bilateral facet hypertrophy. At L5-S1, no significant canal stenosis or focal disc disease. The foramen are patent bilaterally. IMPRESSION: 1. Minimal irregularity anterior superior endplate at L5, corresponding to subtle compression fracture noted on comparison MRI. L4 posttraumatic changes on MRI not well visualized by CT. No fracture of the posterior elements. 2. Severe canal stenosis at L4-L5, due to combination of listhesis, posterior facet arthropathy, and  large extruded disc noted on the MRI. Bilateral foraminal impingement. See MRI report. 3. Partially visualized hypodense mass within the right iliacus. Uncertain if this represents a hematoma or soft tissue mass. There is some edema and soft tissue stranding around the right psoas muscle, further evaluation with pelvic CT could be obtained if desired. Electronically Signed   By: Donavan Foil M.D.   On: 09/08/2016 21:11   Ct Pelvis Wo Contrast  Result Date: 09/08/2016 CLINICAL DATA:  Chronic low back pain and left leg numbness. Recent diagnosis of bone spurs in the lumbar region. EXAM: CT PELVIS WITHOUT CONTRAST TECHNIQUE: Multidetector CT imaging of the pelvis was performed following the standard protocol without intravenous contrast. COMPARISON:  None. FINDINGS: Urinary Tract: Bladder wall is not thickened and no filling defects are identified. Bowel: Visualized portions of colon and small bowel are not abnormally distended. Stool-filled colon. Appendix is normal. Vascular/Lymphatic: Calcifications in the aorta and iliac vessels. No aneurysm. Reproductive: Prostate gland is enlarged, measuring about 4.8 cm diameter. Other: There is infiltration along the right iliopsoas margin with a low-attenuation expansile focus demonstrated in the iliacus muscle measuring about 2.7 x 4 cm in diameter. Appearance is suspicious for any iliopsoas abscess with adjacent inflammatory stranding. Visualization of the area around the right hip is limited due to streak artifact from right hip arthroplasty, but there appears to be small air-fluid levels with fluid collection anterior to the right hip which probably represents extension of the abscess. Musculoskeletal: Postoperative changes with right total hip arthroplasty. Visualized components appear well seated although visualization is limited due to streak artifact. No evidence of acute fracture or dislocation as visualized. SI joints and symphysis pubis are not displaced.  Visualized sacrum and pelvis appear intact. Degenerative changes noted in the lower lumbar spine and in the left hip. IMPRESSION: 1. Inflammatory infiltration in around the right iliopsoas muscle with loculated iliacus muscle abscess and probable extension of the abscess anterior to the right hip arthroplasty. 2. Right total hip arthroplasty. 3. No acute bony abnormalities identified. 4. Degenerative changes in the lower lumbar spine and left hip. Electronically Signed   By: Lucienne Capers M.D.   On: 09/08/2016 20:58   Mr Lumbar Spine Wo Contrast  Result Date: 09/08/2016 CLINICAL DATA:  Patient fell 2 days ago, increasing LEFT leg pain and weakness. EXAM: MRI LUMBAR SPINE WITHOUT CONTRAST TECHNIQUE: Multiplanar, multisequence MR imaging of the lumbar spine was performed. No intravenous contrast was administered. COMPARISON:  Plain films 09/03/2016. FINDINGS: Segmentation:  Standard. Alignment:  5 mm anterolisthesis L4-5. Vertebrae: There is a subtle superior endplate compression fracture of L5 anteriorly. Bone marrow edema is also seen in L4 vertebral body inferiorly. Conus medullaris: Extends to the L1 level and appears normal. Paraspinal and other soft tissues: There is a large hematoma in the RIGHT iliacus muscle. This is incompletely evaluated  on axial images, as its inferior extent is not clearly identified, but cross-sectional measurements on image 35 are 25 x 39 mm. Disc levels: L1-L2:  Normal. L2-L3:  Normal. L3-L4:  Normal. L4-L5: 5 mm anterolisthesis is facet mediated. There is marked posterior element hypertrophy. There is a large disc extrusion with a cephalad migrated free fragment. Severe spinal stenosis is present. BILATERAL subarticular zone and foraminal zone narrowing affect the L4 and L5 nerve roots. Superimposed LEFT lateral extrusion at L4-5 in the extraforaminal compartment was present previously. L5-S1: Unremarkable disc space. Facet arthropathy. No impingement. IMPRESSION: Constellation  of findings suggesting a minor posttraumatic superior endplate fracture at L5. There is also slight bone marrow edema noted inferiorly at L4, which could be due to the recent fall. Recommend CT lumbar spine without contrast to look for an occult fracture of the posterior elements. 5 mm anterolisthesis at L4-5 in conjunction with posterior element hypertrophy and a large extruded disc fragment contribute to severe spinal stenosis and BILATERAL L4 and L5 nerve root impingement. The stenosis and neural impingement have significantly progressed from prior MR of 06/08/2015. Large RIGHT iliacus hematoma? This too is likely posttraumatic although in the setting of history of malignant melanoma, a metastasis cannot completely be excluded. Recommend CT pelvis with contrast, for further evaluation. Electronically Signed   By: Staci Righter M.D.   On: 09/08/2016 18:54   Mr Lumbar Spine W Wo Contrast (assess For Abscess, Cord Compression)  Result Date: 09/09/2016 CLINICAL DATA:  Increasing low back pain with difficulty ambulating. Leg pain and weakness. Recent fall. EXAM: MRI LUMBAR SPINE WITHOUT AND WITH CONTRAST TECHNIQUE: Multiplanar and multiecho pulse sequences of the lumbar spine were obtained without and with intravenous contrast. CONTRAST:  20 mL MultiHance COMPARISON:  Lumbar spine MRI and CT 09/08/2016 FINDINGS: The study is moderately motion degraded, particularly on axial sequences. Segmentation:  Standard. Alignment:  Unchanged grade 1 anterolisthesis of L4 on L5. Vertebrae: An L5 superior endplate compression fracture with minimal vertebral body height loss and mild endplate edema is unchanged from yesterday's MRI. Mild edema along the L4 inferior endplate and in the posterior L4 vertebral body is also unchanged without a frank compression fracture deformity. No abnormal fluid signal or enhancement is seen in the L4-5 disc space to indicate discitis, and there is also vacuum disc phenomenon which is a negative  predictor for infection. There is a small ventral epidural fluid collection in the midline beginning at the mid L4 vertebral body level just above the extruded disc and extending superiorly to the mid L1 vertebral body level. There is no significant associated enhancement aside from a small amount at L4 just above the disc. Conus medullaris: Extends to the L1-2 level and appears normal. Paraspinal and other soft tissues: There are multiple fluid collections within the posterior paraspinal soft tissues, right much greater than left. These extend to the posterior margins of the facet joints on the right from L2-L5 and on the left from L3-L5 with mild surrounding enhancement. There is underlying chronic facet joint arthritis at these levels with facet joint effusions bilaterally, most notable on the left at L4-5. However, no significant marrow edema/ enhancement or osseous destructive changes are seen on MRI or CT to clearly indicate septic facet arthritis. As previously described on MRI and CT, there is a T1 hyperintense collection in the right iliacus muscle, incompletely visualized. Disc levels: Detailed assessment of lumbar degenerative changes is deferred to the recent, less motion degraded MRI. The small ventral epidural collection  from L1-L4 does not appear grossly enlarged compared to that study though it does contribute to mild multifactorial spinal stenosis at L2-3 and L3-4, partially congenital in nature with posterior element hypertrophy and mildly prominent dorsal epidural fat contributing. Severe spinal stenosis at L4-5 due to anterolisthesis, central disc extrusion, ligamentum flavum thickening, and advanced facet arthrosis does not appear significantly changed, nor does severe right and moderate left neural foraminal stenosis. IMPRESSION: 1. Motion degraded examination. 2. Unchanged mild L5 superior endplate compression fracture and mild L4 vertebral body edema without significant height loss. No  evidence of infectious discitis. 3. Grade 1 anterolisthesis of L4 on L5 with unchanged disc extrusion and severe spinal stenosis. Small ventral epidural fluid collection from L1-L4 is without associated enhancement and is not felt to represent abscess but may instead be related to the disc extrusion. 4. Multiple fluid collections with mild surrounding enhancement in the posterior paraspinal soft tissues/musculature, right greater than left. Some of these extend to/communicate with the facet joints. Infection/abscesses are possible, however no osseous changes strongly suggestive of septic facet arthritis are identified and it is possible that these collections reflect underlying degenerative facet arthritis with joint effusions/synovial cysts and soft tissue/intramuscular hematomas related to the recent fall. 5. Partially visualized fluid collection in the right iliacus muscle as previously described, possibly hematoma though abscess is not excluded. Electronically Signed   By: Logan Bores M.D.   On: 09/09/2016 10:43      Scheduled Meds: . fentaNYL      . lidocaine (PF)      . mouth rinse  15 mL Mouth Rinse BID  . metoprolol succinate  50 mg Oral Daily  . midazolam      . simvastatin  20 mg Oral Daily  . temazepam  30 mg Oral QHS   Continuous Infusions: . sodium chloride 125 mL/hr at 09/09/16 0159  . sodium chloride 10 mL/hr (09/09/16 1222)  . methocarbamol (ROBAXIN)  IV    . piperacillin-tazobactam (ZOSYN)  IV Stopped (09/09/16 0955)  . vancomycin       LOS: 0 days    Time spent in minutes: [place], MD [facility] Pager: [email or web address] Password [facility] 09/09/2016, 12:44 PM

## 2016-09-09 NOTE — Progress Notes (Signed)
Chief Complaint: Patient was seen in consultation today for  Chief Complaint  Patient presents with  . Back Pain   at the request of Dr. Wynelle Cleveland  Referring Physician(s): Dr. Wynelle Cleveland  Supervising Physician: Arne Cleveland  Patient Status: [facility] - In-pt  History of Present Illness: Jon Choi is a 66 y.o. male with chronic back who developed worsening acute back pain as well as fevers and 'feeling ill'. Has been admitted and imaging is suspicious for infectious process. Neurosurgery and ortho teams consulted. After review of imaging, he is found to have a right iliacus abscess and some concern that there may be extension to the right hip. Also has severe spinal stenosis at L4-L5 and MRi with contrast was just completed. IR has been consulted for abscess drainage as well as aspiration of the right hip joint to assess for infectious involvement. PMHx, imaging, meds, labs reviewed. Pt has been NPO since admission. No family here at present.  Past Medical History:  Diagnosis Date  . Back pain     History reviewed. No pertinent surgical history.  Allergies: Patient has no known allergies.  Medications:  Current Facility-Administered Medications:  .  0.9 %  sodium chloride infusion, , Intravenous, Continuous, Rise Patience, MD, Last Rate: 125 mL/hr at 09/09/16 0159 .  acetaminophen (TYLENOL) tablet 650 mg, 650 mg, Oral, Q6H PRN **OR** acetaminophen (TYLENOL) suppository 650 mg, 650 mg, Rectal, Q6H PRN, Rise Patience, MD .  diazepam (VALIUM) tablet 5 mg, 5 mg, Oral, Q8H PRN, Rise Patience, MD, 5 mg at 09/09/16 0201 .  lidocaine (PF) (XYLOCAINE) 1 % injection, , , ,  .  MEDLINE mouth rinse, 15 mL, Mouth Rinse, BID, Rise Patience, MD, 15 mL at 09/09/16 1016 .  methocarbamol (ROBAXIN) 500 mg in dextrose 5 % 50 mL IVPB, 500 mg, Intravenous, Q6H PRN, Rise Patience, MD .  metoprolol succinate (TOPROL-XL) 24 hr tablet 50 mg, 50 mg, Oral, Daily,  Rise Patience, MD, 50 mg at 09/09/16 1001 .  morphine 4 MG/ML injection 2 mg, 2 mg, Intravenous, Q4H PRN, Rise Patience, MD, 2 mg at 09/09/16 8299 .  ondansetron (ZOFRAN) tablet 4 mg, 4 mg, Oral, Q6H PRN **OR** ondansetron (ZOFRAN) injection 4 mg, 4 mg, Intravenous, Q6H PRN, Rise Patience, MD, 4 mg at 09/09/16 0155 .  piperacillin-tazobactam (ZOSYN) IVPB 3.375 g, 3.375 g, Intravenous, Q8H, Laren Everts, RPH, Stopped at 09/09/16 0955 .  simvastatin (ZOCOR) tablet 20 mg, 20 mg, Oral, Daily, Rise Patience, MD, 20 mg at 09/09/16 0251 .  temazepam (RESTORIL) capsule 30 mg, 30 mg, Oral, QHS, Rise Patience, MD, 30 mg at 09/09/16 0200 .  vancomycin (VANCOCIN) IVPB 1000 mg/200 mL premix, 1,000 mg, Intravenous, Q12H, Leroy Libman, RPH    Family History  Problem Relation Age of Onset  . Hypertension Other     Social History   Social History  . Marital status: Married    Spouse name: N/A  . Number of children: N/A  . Years of education: N/A   Social History Main Topics  . Smoking status: Former Research scientist (life sciences)  . Smokeless tobacco: Never Used  . Alcohol use Yes  . Drug use: No  . Sexual activity: Not Asked   Other Topics Concern  . None   Social History Narrative  . None    Review of Systems: A 12 point ROS discussed and pertinent positives are indicated in the HPI above.  All other systems are  negative.  Review of Systems  Vital Signs: BP (!) 141/75 (BP Location: Left Arm)   Pulse (!) 120   Temp 97.9 F (36.6 C) (Oral)   Resp 20   Ht 6\' 1"  (1.854 m)   Wt 180 lb 14.4 oz (82.1 kg)   SpO2 94%   BMI 23.87 kg/m   Physical Exam  Constitutional: He is oriented to person, place, and time. He appears well-developed.  Visibly uncomfortable in bed  HENT:  Head: Normocephalic.  Mouth/Throat: Oropharynx is clear and moist.  Neck: Normal range of motion. No JVD present. No tracheal deviation present.  Cardiovascular: Normal rate, regular rhythm and  normal heart sounds.   Pulmonary/Chest: Effort normal and breath sounds normal. No respiratory distress.  Neurological: He is alert and oriented to person, place, and time.  Skin: Skin is warm and dry.  Psychiatric: He has a normal mood and affect. Judgment normal.     Mallampati Score:  MD Evaluation Airway: WNL Heart: WNL Abdomen: WNL Chest/ Lungs: WNL ASA  Classification: 3 Mallampati/Airway Score: Two  Imaging: Dg Chest 2 View  Result Date: 09/08/2016 CLINICAL DATA:  Shortness of Breath EXAM: CHEST  2 VIEW COMPARISON:  05/25/2007 FINDINGS: The heart size and mediastinal contours are within normal limits. Both lungs are clear. The visualized skeletal structures are unremarkable. IMPRESSION: No active cardiopulmonary disease. Electronically Signed   By: Inez Catalina M.D.   On: 09/08/2016 18:19   Dg Lumbar Spine Complete  Result Date: 09/03/2016 CLINICAL DATA:  [place] backwards today well dog walking. Low back pain. EXAM: LUMBAR SPINE - COMPLETE 4+ VIEW COMPARISON:  06/08/2015 FINDINGS: no evidence of fracture. Chronic lower lumbar facet arthropathy and degenerative disc disease with disc space narrowing. Chronic anterolisthesis at L4-5 of 7 mm because of the facet arthropathy. No acute or traumatic finding. IMPRESSION: No acute or traumatic finding. Chronic lumbar degenerative disc disease and degenerative facet disease. Anterolisthesis at L4-5 because of the facet disease. Electronically Signed   By: Nelson Chimes M.D.   On: 09/03/2016 19:06   Ct Head Wo Contrast  Result Date: 09/08/2016 CLINICAL DATA:  Neck pain, weakness and confusion.  Fell 1 week ago. EXAM: CT HEAD WITHOUT CONTRAST CT CERVICAL SPINE WITHOUT CONTRAST TECHNIQUE: Multidetector CT imaging of the head and cervical spine was performed following the standard protocol without intravenous contrast. Multiplanar CT image reconstructions of the cervical spine were also generated. COMPARISON:  None. FINDINGS: CT HEAD FINDINGS  Brain: Diffusely enlarged ventricles and subarachnoid spaces. No intracranial hemorrhage, mass lesion or CT evidence of acute infarction. Vascular: No hyperdense vessel or unexpected calcification. Skull: Normal. Negative for fracture or focal lesion. Sinuses/Orbits: Mild bilateral inferior frontal and anterior ethmoid sinus mucosal thickening. Unremarkable orbits. Other: None. CT CERVICAL SPINE FINDINGS Alignment: Reversal of the normal cervical lordosis. Mild anterolisthesis at the C3-4 and C4-5 levels. Skull base and vertebrae: No acute fracture. No primary bone lesion or focal pathologic process. Soft tissues and spinal canal: No prevertebral fluid or swelling. No visible canal hematoma. Disc levels: Multilevel degenerative changes. These include facet degenerative changes throughout the cervical spine. Upper chest: Mild biapical pleural and parenchymal scarring. Other: Bilateral carotid artery calcifications. IMPRESSION: 1. No skull fracture or intracranial hemorrhage. 2. No cervical spine fracture or traumatic subluxation. 3. Mild diffuse cerebral atrophy. 4. Multilevel cervical spine degenerative changes. These include facet degenerative changes with associated mild anterolisthesis at the C3-4 C4-5 levels. 5. Dense bilateral carotid artery atheromatous calcifications. 6. Minimal chronic bilateral frontal and bilateral ethmoid  sinusitis. Electronically Signed   By: Claudie Revering M.D.   On: 09/08/2016 18:08   Ct Cervical Spine Wo Contrast  Result Date: 09/08/2016 CLINICAL DATA:  Neck pain, weakness and confusion.  Fell 1 week ago. EXAM: CT HEAD WITHOUT CONTRAST CT CERVICAL SPINE WITHOUT CONTRAST TECHNIQUE: Multidetector CT imaging of the head and cervical spine was performed following the standard protocol without intravenous contrast. Multiplanar CT image reconstructions of the cervical spine were also generated. COMPARISON:  None. FINDINGS: CT HEAD FINDINGS Brain: Diffusely enlarged ventricles and  subarachnoid spaces. No intracranial hemorrhage, mass lesion or CT evidence of acute infarction. Vascular: No hyperdense vessel or unexpected calcification. Skull: Normal. Negative for fracture or focal lesion. Sinuses/Orbits: Mild bilateral inferior frontal and anterior ethmoid sinus mucosal thickening. Unremarkable orbits. Other: None. CT CERVICAL SPINE FINDINGS Alignment: Reversal of the normal cervical lordosis. Mild anterolisthesis at the C3-4 and C4-5 levels. Skull base and vertebrae: No acute fracture. No primary bone lesion or focal pathologic process. Soft tissues and spinal canal: No prevertebral fluid or swelling. No visible canal hematoma. Disc levels: Multilevel degenerative changes. These include facet degenerative changes throughout the cervical spine. Upper chest: Mild biapical pleural and parenchymal scarring. Other: Bilateral carotid artery calcifications. IMPRESSION: 1. No skull fracture or intracranial hemorrhage. 2. No cervical spine fracture or traumatic subluxation. 3. Mild diffuse cerebral atrophy. 4. Multilevel cervical spine degenerative changes. These include facet degenerative changes with associated mild anterolisthesis at the C3-4 C4-5 levels. 5. Dense bilateral carotid artery atheromatous calcifications. 6. Minimal chronic bilateral frontal and bilateral ethmoid sinusitis. Electronically Signed   By: Claudie Revering M.D.   On: 09/08/2016 18:08   Ct Lumbar Spine Wo Contrast  Result Date: 09/08/2016 CLINICAL DATA:  Chronic low back pain and left leg numbness history of bone spurs EXAM: CT LUMBAR SPINE WITHOUT CONTRAST TECHNIQUE: Multidetector CT imaging of the lumbar spine was performed without intravenous contrast administration. Multiplanar CT image reconstructions were also generated. COMPARISON:  MRI 09/08/2016, radiograph 09/03/2016 FINDINGS: Segmentation: 5 lumbar type vertebrae. Alignment: Grade 1 anterolisthesis of L4 on L5. Lumbar alignment otherwise within normal limits.  Vertebrae: Mild irregularity anterior superior endplate at L5 corresponding to MRI fracture. Paraspinal and other soft tissues: Aortic atherosclerosis. Partially visualized hypodense mass, likely intramuscular within the right pelvis. Disc levels: At T12-L1, no significant canal stenosis or disc disease. The foramen are patent bilaterally. At L2-L3, no significant disc disease or canal stenosis. The foramen are patent bilaterally. At L2-L3, no significant disc disease or canal stenosis. No significant canal stenosis or bony foraminal narrowing. Posterior facet arthropathy At L3-L4, no significant disc disease or canal stenosis. No significant bony foraminal narrowing. Posterior facet arthropathy At L4-L5, mild disc space narrowing with vacuum disc. Moderate to large disc extrusion with marked canal stenosis, and lateral extension of disc material. Bilateral facet hypertrophy. At L5-S1, no significant canal stenosis or focal disc disease. The foramen are patent bilaterally. IMPRESSION: 1. Minimal irregularity anterior superior endplate at L5, corresponding to subtle compression fracture noted on comparison MRI. L4 posttraumatic changes on MRI not well visualized by CT. No fracture of the posterior elements. 2. 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 Bilateral foraminal impingement. See MRI report. 3. Partially visualized hypodense mass within the right iliacus. Uncertain if this represents a hematoma or soft tissue mass. There is some edema and soft tissue stranding around the right psoas muscle, further evaluation with pelvic CT could be  obtained if desired. Electronically Signed   By: Donavan Foil M.D.   On: 09/08/2016 21:11   Ct Pelvis Wo Contrast  Result Date: 09/08/2016 CLINICAL DATA:  Chronic low back pain and left leg numbness. Recent diagnosis of bone spurs in the lumbar region. EXAM: CT PELVIS WITHOUT  CONTRAST TECHNIQUE: Multidetector CT imaging of the pelvis was performed following the standard protocol without intravenous contrast. COMPARISON:  None. FINDINGS: Urinary Tract: Bladder wall is not thickened and no filling defects are identified. Bowel: Visualized portions of colon and small bowel are not abnormally distended. Stool-filled colon. Appendix is normal. Vascular/Lymphatic: Calcifications in the aorta and iliac vessels. No aneurysm. Reproductive: Prostate gland is enlarged, measuring about 4.8 cm diameter. Other: There is infiltration along the right iliopsoas margin with a low-attenuation expansile focus demonstrated in the iliacus muscle measuring about 2.7 x 4 cm in diameter. Appearance is suspicious for any iliopsoas abscess with adjacent inflammatory stranding. Visualization of the area around the right hip is limited due to streak artifact from right hip arthroplasty, but there appears to be small air-fluid levels with fluid collection anterior to the right hip which probably represents extension of the abscess. Musculoskeletal: Postoperative changes with right total hip arthroplasty. Visualized components appear well seated although visualization is limited due to streak artifact. No evidence of acute fracture or dislocation as visualized. SI joints and symphysis pubis are not displaced. Visualized sacrum and pelvis appear intact. Degenerative changes noted in the lower lumbar spine and in the left hip. IMPRESSION: 1. Inflammatory infiltration in around the right iliopsoas muscle with loculated iliacus muscle abscess and probable extension of the abscess anterior to the right hip arthroplasty. 2. Right total hip arthroplasty. 3. No acute bony abnormalities identified. 4. Degenerative changes in the lower lumbar spine and left hip. Electronically Signed   By: Lucienne Capers M.D.   On: 09/08/2016 20:58   Mr Lumbar Spine Wo Contrast  Result Date: 09/08/2016 CLINICAL DATA:  Patient fell 2 days  ago, increasing LEFT leg pain and weakness. EXAM: MRI LUMBAR SPINE WITHOUT CONTRAST TECHNIQUE: Multiplanar, multisequence MR imaging of the lumbar spine was performed. No intravenous contrast was administered. COMPARISON:  Plain films 09/03/2016. FINDINGS: Segmentation:  Standard. Alignment:  5 mm anterolisthesis L4-5. Vertebrae: There is a subtle superior endplate compression fracture of L5 anteriorly. Bone marrow edema is also seen in L4 vertebral body inferiorly. Conus medullaris: Extends to the L1 level and appears normal. Paraspinal and other soft tissues: There is a large hematoma in the RIGHT iliacus muscle. This is incompletely evaluated on axial images, as its inferior extent is not clearly identified, but cross-sectional measurements on image 35 are 25 x 39 mm. Disc levels: L1-L2:  Normal. L2-L3:  Normal. L3-L4:  Normal. L4-L5: 5 mm anterolisthesis is facet mediated. There is marked posterior element hypertrophy. There is a large disc extrusion with a cephalad migrated free fragment. Severe spinal stenosis is present. BILATERAL subarticular zone and foraminal zone narrowing affect the L4 and L5 nerve roots. Superimposed LEFT lateral extrusion at L4-5 in the extraforaminal compartment was present previously. L5-S1: Unremarkable disc space. Facet arthropathy. No impingement. IMPRESSION: Constellation of findings suggesting a minor posttraumatic superior endplate fracture at L5. There is also slight bone marrow edema noted inferiorly at L4, which could be due to the recent fall. Recommend CT lumbar spine without contrast to look for an occult fracture of the posterior elements. 5 mm anterolisthesis at L4-5 in conjunction with posterior element hypertrophy and a large extruded disc fragment  contribute to severe spinal stenosis and BILATERAL L4 and L5 nerve root impingement. The stenosis and neural impingement have significantly progressed from prior MR of 06/08/2015. Large RIGHT iliacus hematoma? This too is  likely posttraumatic although in the setting of history of malignant melanoma, a metastasis cannot completely be excluded. Recommend CT pelvis with contrast, for further evaluation. Electronically Signed   By: Staci Righter M.D.   On: 09/08/2016 18:54   Dg Epidural/nerve Root  Result Date: 08/29/2016 CLINICAL DATA:  Lumbosacral spondylosis without myelopathy with radiculopathy. Predominantly left-sided low back and buttock pain with worsening numbness throughout the left leg. No significant improvement from the L4-5 facet injection. Progressively diminishing duration of improvement from L4-5 interlaminar epidural injections. Multifactorial spinal stenosis and left-sided lateral recess and neural foraminal stenosis at L4-5 on MRI. EXAM: EPIDURAL/NERVE ROOT FLUOROSCOPY TIME:  Radiation Exposure Index (as provided by the fluoroscopic device): 24.81 microGray*m^2 Fluoroscopy Time (in minutes and seconds):  17 seconds PROCEDURE: The procedure, risks, benefits, and alternatives were explained to the patient. Questions regarding the procedure were encouraged and answered. The patient understands and consents to the procedure. LEFT L4 NERVE ROOT BLOCK AND TRANSFORAMINAL EPIDURAL: A posterior oblique approach was taken to the intervertebral foramen on the left at L4-5 using a curved 3.5 inch 22 gauge spinal needle. Injection of Isovue-M 200 outlined the left L4 nerve root and showed good epidural spread. No vascular opacification is seen. 120 mg of Depo-Medrol mixed with 2 mL of 1% lidocaine were instilled. The procedure was well-tolerated, and the patient was discharged thirty minutes following the injection in good condition. COMPLICATIONS: None IMPRESSION: Technically successful injection consisting of a left L4 nerve root block and transforaminal epidural. Electronically Signed   By: Logan Bores M.D.   On: 08/29/2016 10:41    Labs:  CBC:  Recent Labs  09/08/16 1926 09/09/16 0359  WBC 14.7* 14.4*  HGB  12.7* 11.9*  HCT 37.3* 36.0*  PLT 219 235    COAGS: No results for input(s): INR, APTT in the last 8760 hours.  BMP:  Recent Labs  09/08/16 1926 09/09/16 0359  NA 130* 133*  K 3.9 3.7  CL 94* 97*  CO2 24 25  GLUCOSE 140* 114*  BUN 39* 39*  CALCIUM 9.5 8.9  CREATININE 1.63* 1.19  GFRNONAA 43* >60  GFRAA 50* >60    LIVER FUNCTION TESTS:  Recent Labs  09/08/16 1926 09/09/16 0359  BILITOT 1.2 1.0  AST 47* 41  ALT 35 31  ALKPHOS 73 72  PROT 6.9 6.6  ALBUMIN 2.7* 2.3*    TUMOR MARKERS: No results for input(s): AFPTM, CEA, CA199, CHROMGRNA in the last 8760 hours.  Assessment and Plan: Back and right leg pain. (R)iliacus abscess, possible extension to right hip. MRI L Spine to eval for infectious involvement. Imaging reviewed by Dr. Vernard Gambles Plan to proceed with CT guided iliacus abscess drainage, as well as image guided right hip joint aspiration. Agree with holding off IV abx until fluid samples obtained for Cx, RN advised. Risks and Benefits discussed with the patient including bleeding, infection, damage to adjacent structures, bowel perforation/fistula connection, and sepsis. All of the patient's questions were answered, patient is agreeable to proceed. Consent signed and in chart.    Thank you for this interesting consult.  I greatly enjoyed meeting RUSS LOOPER and look forward to participating in their care.  A copy of this report was sent to the requesting provider on this date.  Electronically Signed: Ascencion Dike, PA-C  09/09/2016, 10:36 AM   I spent a total of 25 minutes in face to face in clinical consultation, greater than 50% of which was counseling/coordinating care for iliacus abscess drainage and right hip aspiration

## 2016-09-09 NOTE — ED Notes (Signed)
Unable to give report they are trying to get this pt moved to another floor

## 2016-09-09 NOTE — Progress Notes (Signed)
Patient currently in the MRI scanner.  I reviewed the CT scan of the pelvis.  There does appear to be some stranding of tissue extending into the muscle anterior to the total hip placement.  I do not see a definite effusion in the right hip joint.  Recommend ultrasound-guided aspiration from a lateral approach of the hip joint to see if there is any indication for operative hip debridement.  At this time the CT scan appears to show no hip effusion but we need better imaging to make that conclusion.  Ultrasound would be optimal with aspiration of the joint from a lateral approach to be done.

## 2016-09-09 NOTE — ED Notes (Signed)
The admitting doctor at the bedside

## 2016-09-09 NOTE — Progress Notes (Signed)
Patient 66 years old white male admitted from Ed with sepsis to 5 M 15.Marland Kitchen Pt transfer to bed with 5 person  assist r/t pain and LE weakness .Alert and oriented to person place and situation but disoriented with time.. C /o difficult walking x 5 days.severe pain to rt hip and rt lateral aspect of the back, and  bil lower extremity weakness.Marland KitchenPt/family oriented to room and use of call system for help. Cardiac monitor in use box 19 and C CMT notify for verification. Safety education given and bed alarm  In use,and  bed in low position.. RN will medicate for pain and continue to monitor Pt.Marland Kitchen

## 2016-09-09 NOTE — Progress Notes (Signed)
Pharmacy Antibiotic Note  Jon Choi is a 66 y.o. male admitted on 09/08/2016 with sepsis. Patient came in with an AKI likely due to poor po intake, Scr 1.63. Patient's renal function has improved overnight with hydration, Scr today is 1.19. Pharmacy consulted to dose vancomycin.   Plan: Adjust Vacomycin dose to 1000 mg IV every 12 hours. Goal trough 15-20 mcg/ml Continue Zosyn 3.375g IV Q8Hr  Monitor renal function and adjust dose as appropriate  Height: 6\' 1"  (185.4 cm) Weight: 180 lb 14.4 oz (82.1 kg) IBW/kg (Calculated) : 79.9  Temp (24hrs), Avg:98.5 F (36.9 C), Min:97.6 F (36.4 C), Max:99.8 F (37.7 C)   Recent Labs Lab 09/08/16 1926 09/08/16 1936 09/09/16 0359  WBC 14.7*  --  14.4*  CREATININE 1.63*  --  1.19  LATICACIDVEN  --  1.48  --     Estimated Creatinine Clearance: 71.8 mL/min (by C-G formula based on SCr of 1.19 mg/dL).    No Known Allergies   Thank you for allowing pharmacy to be a part of this patient's care.  Leroy Libman, PharmD Pharmacy Resident Pager: [phone]  09/09/2016 8:44 AM

## 2016-09-09 NOTE — Procedures (Signed)
  Procedure:   1. R hip aspiration scant clear yellow fluid with lavage     2. R pelvic abscess drain placement 28F  81ml puruelnt aspirate for GS, C&S Preprocedure diagnosis:  abscess Postprocedure diagnosis:  same EBL:     minimal Complications:   none immediate  See full dictation in [facility].  Dillard Cannon MD Main # [phone] Pager  [phone]

## 2016-09-09 NOTE — ED Notes (Signed)
Report given to rn on 5m 

## 2016-09-10 LAB — BASIC METABOLIC PANEL
ANION GAP: 8 (ref 5–15)
BUN: 26 mg/dL — AB (ref 6–20)
CHLORIDE: 99 mmol/L — AB (ref 101–111)
CO2: 27 mmol/L (ref 22–32)
Calcium: 8.6 mg/dL — ABNORMAL LOW (ref 8.9–10.3)
Creatinine, Ser: 0.91 mg/dL (ref 0.61–1.24)
GFR calc Af Amer: >60 mL/min (ref >60)
GFR calc non Af Amer: >60 mL/min (ref >60)
Glucose, Bld: 148 mg/dL — ABNORMAL HIGH (ref 65–99)
POTASSIUM: 3.7 mmol/L (ref 3.5–5.1)
Sodium: 134 mmol/L — ABNORMAL LOW (ref 135–145)

## 2016-09-10 LAB — CBC
HEMATOCRIT: 34.5 % — AB (ref 39.0–52.0)
HEMOGLOBIN: 11.4 g/dL — AB (ref 13.0–17.0)
MCH: 31.5 pg (ref 26.0–34.0)
MCHC: 33 g/dL (ref 30.0–36.0)
MCV: 95.3 fL (ref 78.0–100.0)
Platelets: 272 10*3/uL (ref 150–400)
RBC: 3.62 MIL/uL — ABNORMAL LOW (ref 4.22–5.81)
RDW: 12.9 % (ref 11.5–15.5)
WBC: 13 10*3/uL — ABNORMAL HIGH (ref 4.0–10.5)

## 2016-09-10 LAB — GLUCOSE, CAPILLARY
GLUCOSE-CAPILLARY: 123 mg/dL — AB (ref 65–99)
Glucose-Capillary: 115 mg/dL — ABNORMAL HIGH (ref 65–99)
Glucose-Capillary: 119 mg/dL — ABNORMAL HIGH (ref 65–99)

## 2016-09-10 MED ORDER — SODIUM CHLORIDE 0.9 % IV SOLN
25.0000 mg | Freq: Four times a day (QID) | INTRAVENOUS | Status: DC | PRN
  Administered 2016-09-10 – 2016-09-15 (×3): 25 mg via INTRAVENOUS
  Filled 2016-09-10 (×4): qty 1

## 2016-09-10 MED ORDER — SODIUM CHLORIDE 0.9% FLUSH: 5.0000 mL | Freq: Three times a day (TID) | INTRAVENOUS | Status: DC

## 2016-09-10 MED ORDER — HYDROCODONE-ACETAMINOPHEN 5-325 MG PO TABS
2.0000 | ORAL_TABLET | Freq: Four times a day (QID) | ORAL | Status: DC | PRN
  Administered 2016-09-10 – 2016-09-11 (×3): 2 via ORAL
  Filled 2016-09-10 (×3): qty 2

## 2016-09-10 MED ORDER — SODIUM CHLORIDE 0.9% FLUSH
5.0000 mL | Freq: Three times a day (TID) | INTRAVENOUS | Status: DC
  Administered 2016-09-10 – 2016-09-14 (×9): 5 mL via INTRAVENOUS
  Administered 2016-09-15: 20 mL via INTRAVENOUS

## 2016-09-10 NOTE — Progress Notes (Signed)
Patient examined this morning. Right hip is non-irritable.  No pain with internal/external rotation. Agree with earlier assessment that iliopsoas abscess is extending anterior to the hip joint but the hip joint itself does not appear to be infected. We do need ultrasound-guided aspiration from lateral approach or fluoroscopic guided aspiration from lateral approach to confirm no fluid in the hip joint. Interventional radiology has been consulted. Full consult to follow

## 2016-09-10 NOTE — Progress Notes (Signed)
Patient ID: Jon Choi, male   DOB: 1950-10-10, 66 y.o.   MRN: [id number] Subjective: Patient remains quite confused. He is disoriented. He does not give reasonable answers to even simple questions such as when asked if he has pain. In general he does look more comfortable. He seems to move his legs with more strength. He is antigravity in all muscle groups.  Objective: Vital signs in last 24 hours: Temp:  [97.5 F (36.4 C)-98.8 F (37.1 C)] 97.5 F (36.4 C) (07/29 0552) Pulse Rate:  [103-134] 104 (07/29 0552) Resp:  [12-24] 20 (07/29 0552) BP: (130-171)/(71-96) 161/80 (07/29 0552) SpO2:  [93 %-97 %] 95 % (07/29 0552)  Intake/Output from previous day: 07/28 0701 - 07/29 0700 In: 500 [IV Piggyback:500] Out: 7867 [Urine:1250; Drains:30] Intake/Output this shift: No intake/output data recorded.    Lab Results: Lab Results  Component Value Date   WBC 13.0 (H) 09/10/2016   HGB 11.4 (L) 09/10/2016   HCT 34.5 (L) 09/10/2016   MCV 95.3 09/10/2016   PLT 272 09/10/2016   No results found for: INR, PROTIME BMET Lab Results  Component Value Date   NA 134 (L) 09/10/2016   K 3.7 09/10/2016   CL 99 (L) 09/10/2016   CO2 27 09/10/2016   GLUCOSE 148 (H) 09/10/2016   BUN 26 (H) 09/10/2016   CREATININE 0.91 09/10/2016   CALCIUM 8.6 (L) 09/10/2016    Studies/Results: Dg Chest 2 View  Result Date: 09/08/2016 CLINICAL DATA:  Shortness of Breath EXAM: CHEST  2 VIEW COMPARISON:  05/25/2007 FINDINGS: The heart size and mediastinal contours are within normal limits. Both lungs are clear. The visualized skeletal structures are unremarkable. IMPRESSION: No active cardiopulmonary disease. Electronically Signed   By: Inez Catalina M.D.   On: 09/08/2016 18:19   Ct Head Wo Contrast  Result Date: 09/08/2016 CLINICAL DATA:  Neck pain, weakness and confusion.  Fell 1 week ago. EXAM: CT HEAD WITHOUT CONTRAST CT CERVICAL SPINE WITHOUT CONTRAST TECHNIQUE: Multidetector CT imaging of the head and  cervical spine was performed following the standard protocol without intravenous contrast. Multiplanar CT image reconstructions of the cervical spine were also generated. COMPARISON:  None. FINDINGS: CT HEAD FINDINGS Brain: Diffusely enlarged ventricles and subarachnoid spaces. No intracranial hemorrhage, mass lesion or CT evidence of acute infarction. Vascular: No hyperdense vessel or unexpected calcification. Skull: Normal. Negative for fracture or focal lesion. Sinuses/Orbits: Mild bilateral inferior frontal and anterior ethmoid sinus mucosal thickening. Unremarkable orbits. Other: None. CT CERVICAL SPINE FINDINGS Alignment: Reversal of the normal cervical lordosis. Mild anterolisthesis at the C3-4 and C4-5 levels. Skull base and vertebrae: No acute fracture. No primary bone lesion or focal pathologic process. Soft tissues and spinal canal: No prevertebral fluid or swelling. No visible canal hematoma. Disc levels: Multilevel degenerative changes. These include facet degenerative changes throughout the cervical spine. Upper chest: Mild biapical pleural and parenchymal scarring. Other: Bilateral carotid artery calcifications. IMPRESSION: 1. No skull fracture or intracranial hemorrhage. 2. No cervical spine fracture or traumatic subluxation. 3. Mild diffuse cerebral atrophy. 4. Multilevel cervical spine degenerative changes. These include facet degenerative changes with associated mild anterolisthesis at the C3-4 C4-5 levels. 5. Dense bilateral carotid artery atheromatous calcifications. 6. Minimal chronic bilateral frontal and bilateral ethmoid sinusitis. Electronically Signed   By: Claudie Revering M.D.   On: 09/08/2016 18:08   Ct Cervical Spine Wo Contrast  Result Date: 09/08/2016 CLINICAL DATA:  Neck pain, weakness and confusion.  Fell 1 week ago. EXAM: CT HEAD WITHOUT CONTRAST CT  CERVICAL SPINE WITHOUT CONTRAST TECHNIQUE: Multidetector CT imaging of the head and cervical spine was performed following the  standard protocol without intravenous contrast. Multiplanar CT image reconstructions of the cervical spine were also generated. COMPARISON:  None. FINDINGS: CT HEAD FINDINGS Brain: Diffusely enlarged ventricles and subarachnoid spaces. No intracranial hemorrhage, mass lesion or CT evidence of acute infarction. Vascular: No hyperdense vessel or unexpected calcification. Skull: Normal. Negative for fracture or focal lesion. Sinuses/Orbits: Mild bilateral inferior frontal and anterior ethmoid sinus mucosal thickening. Unremarkable orbits. Other: None. CT CERVICAL SPINE FINDINGS Alignment: Reversal of the normal cervical lordosis. Mild anterolisthesis at the C3-4 and C4-5 levels. Skull base and vertebrae: No acute fracture. No primary bone lesion or focal pathologic process. Soft tissues and spinal canal: No prevertebral fluid or swelling. No visible canal hematoma. Disc levels: Multilevel degenerative changes. These include facet degenerative changes throughout the cervical spine. Upper chest: Mild biapical pleural and parenchymal scarring. Other: Bilateral carotid artery calcifications. IMPRESSION: 1. No skull fracture or intracranial hemorrhage. 2. No cervical spine fracture or traumatic subluxation. 3. Mild diffuse cerebral atrophy. 4. Multilevel cervical spine degenerative changes. These include facet degenerative changes with associated mild anterolisthesis at the C3-4 C4-5 levels. 5. Dense bilateral carotid artery atheromatous calcifications. 6. Minimal chronic bilateral frontal and bilateral ethmoid sinusitis. Electronically Signed   By: Claudie Revering M.D.   On: 09/08/2016 18:08   Ct Lumbar Spine Wo Contrast  Result Date: 09/08/2016 CLINICAL DATA:  Chronic low back pain and left leg numbness history of bone spurs EXAM: CT LUMBAR SPINE WITHOUT CONTRAST TECHNIQUE: Multidetector CT imaging of the lumbar spine was performed without intravenous contrast administration. Multiplanar CT image reconstructions were  also generated. COMPARISON:  MRI 09/08/2016, radiograph 09/03/2016 FINDINGS: Segmentation: 5 lumbar type vertebrae. Alignment: Grade 1 anterolisthesis of L4 on L5. Lumbar alignment otherwise within normal limits. Vertebrae: Mild irregularity anterior superior endplate at L5 corresponding to MRI fracture. Paraspinal and other soft tissues: Aortic atherosclerosis. Partially visualized hypodense mass, likely intramuscular within the right pelvis. Disc levels: At T12-L1, no significant canal stenosis or disc disease. The foramen are patent bilaterally. At L2-L3, no significant disc disease or canal stenosis. The foramen are patent bilaterally. At L2-L3, no significant disc disease or canal stenosis. No significant canal stenosis or bony foraminal narrowing. Posterior facet arthropathy At L3-L4, no significant disc disease or canal stenosis. No significant bony foraminal narrowing. Posterior facet arthropathy At L4-L5, mild disc space narrowing with vacuum disc. Moderate to large disc extrusion with marked canal stenosis, and lateral extension of disc material. Bilateral facet hypertrophy. At L5-S1, no significant canal stenosis or focal disc disease. The foramen are patent bilaterally. IMPRESSION: 1. Minimal irregularity anterior superior endplate at L5, corresponding to subtle compression fracture noted on comparison MRI. L4 posttraumatic changes on MRI not well visualized by CT. No fracture of the posterior elements. 2. 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 Bilateral foraminal impingement. See MRI report. 3. Partially visualized hypodense mass within the right iliacus. Uncertain if this represents a hematoma or soft tissue mass. There is some edema and soft tissue stranding around the right psoas muscle, further evaluation with pelvic CT could be obtained if desired. Electronically Signed   By: Donavan Foil M.D.   On:  09/08/2016 21:11   Ct Pelvis Wo Contrast  Result Date: 09/08/2016 CLINICAL DATA:  Chronic low back pain and left leg numbness. Recent diagnosis of bone spurs in  the lumbar region. EXAM: CT PELVIS WITHOUT CONTRAST TECHNIQUE: Multidetector CT imaging of the pelvis was performed following the standard protocol without intravenous contrast. COMPARISON:  None. FINDINGS: Urinary Tract: Bladder wall is not thickened and no filling defects are identified. Bowel: Visualized portions of colon and small bowel are not abnormally distended. Stool-filled colon. Appendix is normal. Vascular/Lymphatic: Calcifications in the aorta and iliac vessels. No aneurysm. Reproductive: Prostate gland is enlarged, measuring about 4.8 cm diameter. Other: There is infiltration along the right iliopsoas margin with a low-attenuation expansile focus demonstrated in the iliacus muscle measuring about 2.7 x 4 cm in diameter. Appearance is suspicious for any iliopsoas abscess with adjacent inflammatory stranding. Visualization of the area around the right hip is limited due to streak artifact from right hip arthroplasty, but there appears to be small air-fluid levels with fluid collection anterior to the right hip which probably represents extension of the abscess. Musculoskeletal: Postoperative changes with right total hip arthroplasty. Visualized components appear well seated although visualization is limited due to streak artifact. No evidence of acute fracture or dislocation as visualized. SI joints and symphysis pubis are not displaced. Visualized sacrum and pelvis appear intact. Degenerative changes noted in the lower lumbar spine and in the left hip. IMPRESSION: 1. Inflammatory infiltration in around the right iliopsoas muscle with loculated iliacus muscle abscess and probable extension of the abscess anterior to the right hip arthroplasty. 2. Right total hip arthroplasty. 3. No acute bony abnormalities identified. 4. Degenerative changes  in the lower lumbar spine and left hip. Electronically Signed   By: Lucienne Capers M.D.   On: 09/08/2016 20:58   Mr Lumbar Spine Wo Contrast  Result Date: 09/08/2016 CLINICAL DATA:  Patient fell 2 days ago, increasing LEFT leg pain and weakness. EXAM: MRI LUMBAR SPINE WITHOUT CONTRAST TECHNIQUE: Multiplanar, multisequence MR imaging of the lumbar spine was performed. No intravenous contrast was administered. COMPARISON:  Plain films 09/03/2016. FINDINGS: Segmentation:  Standard. Alignment:  5 mm anterolisthesis L4-5. Vertebrae: There is a subtle superior endplate compression fracture of L5 anteriorly. Bone marrow edema is also seen in L4 vertebral body inferiorly. Conus medullaris: Extends to the L1 level and appears normal. Paraspinal and other soft tissues: There is a large hematoma in the RIGHT iliacus muscle. This is incompletely evaluated on axial images, as its inferior extent is not clearly identified, but cross-sectional measurements on image 35 are 25 x 39 mm. Disc levels: L1-L2:  Normal. L2-L3:  Normal. L3-L4:  Normal. L4-L5: 5 mm anterolisthesis is facet mediated. There is marked posterior element hypertrophy. There is a large disc extrusion with a cephalad migrated free fragment. Severe spinal stenosis is present. BILATERAL subarticular zone and foraminal zone narrowing affect the L4 and L5 nerve roots. Superimposed LEFT lateral extrusion at L4-5 in the extraforaminal compartment was present previously. L5-S1: Unremarkable disc space. Facet arthropathy. No impingement. IMPRESSION: Constellation of findings suggesting a minor posttraumatic superior endplate fracture at L5. There is also slight bone marrow edema noted inferiorly at L4, which could be due to the recent fall. Recommend CT lumbar spine without contrast to look for an occult fracture of the posterior elements. 5 mm anterolisthesis at L4-5 in conjunction with posterior element hypertrophy and a large extruded disc fragment contribute to  severe spinal stenosis and BILATERAL L4 and L5 nerve root impingement. The stenosis and neural impingement have significantly progressed from prior MR of 06/08/2015. Large RIGHT iliacus hematoma? This too is likely posttraumatic although in the setting of history of malignant melanoma,  a metastasis cannot completely be excluded. Recommend CT pelvis with contrast, for further evaluation. Electronically Signed   By: Staci Righter M.D.   On: 09/08/2016 18:54   Mr Lumbar Spine W Wo Contrast (assess For Abscess, Cord Compression)  Result Date: 09/09/2016 CLINICAL DATA:  Increasing low back pain with difficulty ambulating. Leg pain and weakness. Recent fall. EXAM: MRI LUMBAR SPINE WITHOUT AND WITH CONTRAST TECHNIQUE: Multiplanar and multiecho pulse sequences of the lumbar spine were obtained without and with intravenous contrast. CONTRAST:  20 mL MultiHance COMPARISON:  Lumbar spine MRI and CT 09/08/2016 FINDINGS: The study is moderately motion degraded, particularly on axial sequences. Segmentation:  Standard. Alignment:  Unchanged grade 1 anterolisthesis of L4 on L5. Vertebrae: An L5 superior endplate compression fracture with minimal vertebral body height loss and mild endplate edema is unchanged from yesterday's MRI. Mild edema along the L4 inferior endplate and in the posterior L4 vertebral body is also unchanged without a frank compression fracture deformity. No abnormal fluid signal or enhancement is seen in the L4-5 disc space to indicate discitis, and there is also vacuum disc phenomenon which is a negative predictor for infection. There is a small ventral epidural fluid collection in the midline beginning at the mid L4 vertebral body level just above the extruded disc and extending superiorly to the mid L1 vertebral body level. There is no significant associated enhancement aside from a small amount at L4 just above the disc. Conus medullaris: Extends to the L1-2 level and appears normal. Paraspinal and  other soft tissues: There are multiple fluid collections within the posterior paraspinal soft tissues, right much greater than left. These extend to the posterior margins of the facet joints on the right from L2-L5 and on the left from L3-L5 with mild surrounding enhancement. There is underlying chronic facet joint arthritis at these levels with facet joint effusions bilaterally, most notable on the left at L4-5. However, no significant marrow edema/ enhancement or osseous destructive changes are seen on MRI or CT to clearly indicate septic facet arthritis. As previously described on MRI and CT, there is a T1 hyperintense collection in the right iliacus muscle, incompletely visualized. Disc levels: Detailed assessment of lumbar degenerative changes is deferred to the recent, less motion degraded MRI. The small ventral epidural collection from L1-L4 does not appear grossly enlarged compared to that study though it does contribute to mild multifactorial spinal stenosis at L2-3 and L3-4, partially congenital in nature with posterior element hypertrophy and mildly prominent dorsal epidural fat contributing. Severe spinal stenosis at L4-5 due to anterolisthesis, central disc extrusion, ligamentum flavum thickening, and advanced facet arthrosis does not appear significantly changed, nor does severe right and moderate left neural foraminal stenosis. IMPRESSION: 1. Motion degraded examination. 2. Unchanged mild L5 superior endplate compression fracture and mild L4 vertebral body edema without significant height loss. No evidence of infectious discitis. 3. Grade 1 anterolisthesis of L4 on L5 with unchanged disc extrusion and severe spinal stenosis. Small ventral epidural fluid collection from L1-L4 is without associated enhancement and is not felt to represent abscess but may instead be related to the disc extrusion. 4. Multiple fluid collections with mild surrounding enhancement in the posterior paraspinal soft  tissues/musculature, right greater than left. Some of these extend to/communicate with the facet joints. Infection/abscesses are possible, however no osseous changes strongly suggestive of septic facet arthritis are identified and it is possible that these collections reflect underlying degenerative facet arthritis with joint effusions/synovial cysts and soft tissue/intramuscular hematomas related to  the recent fall. 5. Partially visualized fluid collection in the right iliacus muscle as previously described, possibly hematoma though abscess is not excluded. Electronically Signed   By: Logan Bores M.D.   On: 09/09/2016 10:43   Ct Aspiration  Result Date: 09/09/2016 CLINICAL DATA:  Back pain. MR demonstrates right iliacus and iliopsoas bursal fluid collections. Patient has a right hip prosthesis and aspiration is requested to exclude septic joint. EXAM: CT GUIDED ASPIRATION BIOPSY OF RIGHT HIP ANESTHESIA/SEDATION: Intravenous Fentanyl and Versed were administered as conscious sedation during continuous monitoring of the patient's level of consciousness and physiological / cardiorespiratory status by the radiology RN, with a total moderate sedation time of 15 minutes. PROCEDURE: The procedure risks, benefits, and alternatives were explained to the patient. Questions regarding the procedure were encouraged and answered. The patient understands and consents to the procedure. Select axial scans through the right hip were obtained. An appropriate skin entry site was determined and marked. The operative field was prepped with chlorhexidinein a sterile fashion, and a sterile drape was applied covering the operative field. A sterile gown and sterile gloves were used for the procedure. Local anesthesia was provided with 1% Lidocaine. Under CT fluoroscopic guidance, an 18 gauge spinal needle was advanced to the anterior aspect of the prosthetic femoral head using a steep anterolateral approach in order to avoid the  distended iliopsoas bursa. Initial aspiration returned no joint fluid. After lavage with 5 mL nonbacteriostatic saline, a scant amount of clear yellow joint fluid was aspirated, sent for Gram stain and culture. The patient tolerated the procedure well. COMPLICATIONS: None immediate FINDINGS: No significant fluid returned on initial aspiration. A scant amount of fluid was returned post lavage, sent for Gram stain and culture as above. IMPRESSION: 1. Technically successful right hip aspiration under CT guidance Electronically Signed   By: Lucrezia Europe M.D.   On: 09/09/2016 13:45   Ct Image Guided Drainage By Percutaneous Catheter  Result Date: 09/09/2016 CLINICAL DATA:  Back pain. MR and CT suggests right iliacus abscess. EXAM: CT GUIDED DRAINAGE OF RIGHT PELVIC ABSCESS ANESTHESIA/SEDATION: Intravenous Fentanyl and Versed were administered as conscious sedation during continuous monitoring of the patient's level of consciousness and physiological / cardiorespiratory status by the radiology RN, with a total moderate sedation time of 30 minutes. PROCEDURE: The procedure, risks, benefits, and alternatives were explained to the patient. Questions regarding the procedure were encouraged and answered. The patient understands and consents to the procedure. Select axial scans through the pelvis were obtained. The collection was localized and an appropriate skin entry site was determined and marked. The operative field was prepped with chlorhexidinein a sterile fashion, and a sterile drape was applied covering the operative field. A sterile gown and sterile gloves were used for the procedure. Local anesthesia was provided with 1% Lidocaine. Under CT fluoroscopic guidance, a 19 gauge percutaneous entry needle was advanced into the collection. A small amount purulent material was aspirated. An Amplatz guidewire advanced easily within the collection, its position confirmed on CT. Tract dilated to facilitate placement of a 12  French pigtail catheter, formed within the dependent aspect of the collection. 30 mL of purulent fluid were aspirated, a sample sent for Gram stain and culture. Catheter secured externally with 0 Prolene suture and StatLock and placed to gravity drain bag. The patient tolerated the procedure well. COMPLICATIONS: None immediate FINDINGS: The right iliacus low-attenuation collection was localized. Aspiration returned purulent material. A 12 French drain catheter was placed as above. IMPRESSION: 1. Technically successful  CT-guided pelvic abscess drain catheter placement. Electronically Signed   By: Lucrezia Europe M.D.   On: 09/09/2016 14:15    Assessment/Plan: Spondylolisthesis with stenosis L4-5 - he is not a candidate for elective surgery with active infection. Abscess drained yesterday by IR, awaiting cultures. On antibiotics. Dr. Saintclair Halsted will be back tomorrow and will likely assume his care from a neurosurgical standpoint.   LOS: 1 day    Korina Tretter S 09/10/2016, 8:32 AM

## 2016-09-10 NOTE — Progress Notes (Addendum)
PROGRESS NOTE    Jon Choi   GQQ:[id number]  DOB: [birth year]  DOA: 09/08/2016 PCP: [name], MD   Brief Narrative:  Jon Choi 66 y.o. male with history of hypertension, hyperlipidemia chronic back pain malignant melanoma was brought to the ER after patient was having increasing back pain with difficulty ambulating. Imaging reveals a right psoas abscess and severe stenosis of L4-L5 with compression fx at L5.    Subjective:  confused. No complaints. Had conversation with his wife just now and gave her an update.  ROS: no complaints of nausea, vomiting, constipation, diarrhea, cough, dyspnea or dysuria. No other complaints.   Assessment & Plan:   Principal Problem:   Iliopsoas abscess   Sepsis - temp 100.3, WBC 14, HR in 100s - lactic acid normal at 1.48 - IR guided drainage -  wound cultures showing staph thus far - will continue Vanc and Zosyn  Active Problems: Acute encephalopathy - likely due to narcotics- wife noted it at home  - d/c Fentanyl and Valium -  Morphine, Hydrocodone and Robaxin - continue to wean Narcotics as able     ARF (acute renal failure)  - due to sepsis, dehydration - has improved  Hyponatremia  - dehydration improved with IVF - IVF on hold now- good urine output  Hiccoughs - Thorazine PRN  Hypertension - Metoprolol     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-L5 level - neurosurgery notes that he is current not a candidate for surgery with an ongoing infectious process    HLD (hyperlipidemia) - Zocor   DVT prophylaxis: SCDs Code Status: Full code Family Communication:  Disposition Plan:  Consultants:   Neurosurgery  IR Procedures:    Antimicrobials:  Anti-infectives    Start     Dose/Rate Route Frequency Ordered Stop   09/09/16 1000  vancomycin (VANCOCIN) IVPB 750 mg/150 ml premix  Status:  Discontinued     750 mg 150 mL/hr over 60 Minutes Intravenous Every 12 hours 09/09/16 0108 09/09/16 0853   09/09/16 1000  vancomycin  (VANCOCIN) IVPB 1000 mg/200 mL premix     1,000 mg 200 mL/hr over 60 Minutes Intravenous Every 12 hours 09/09/16 0853     09/09/16 0400  piperacillin-tazobactam (ZOSYN) IVPB 3.375 g     3.375 g 12.5 mL/hr over 240 Minutes Intravenous Every 8 hours 09/09/16 0108     09/08/16 2115  piperacillin-tazobactam (ZOSYN) IVPB 3.375 g     3.375 g 100 mL/hr over 30 Minutes Intravenous  Once 09/08/16 2107 09/08/16 2218   09/08/16 2115  vancomycin (VANCOCIN) IVPB 1000 mg/200 mL premix     1,000 mg 200 mL/hr over 60 Minutes Intravenous  Once 09/08/16 2107 09/08/16 2320       Objective: Vitals:   09/10/16 0552 09/10/16 0940 09/10/16 1100 09/10/16 1156  BP: (!) 161/80 (!) 156/87 (!) 156/88 (!) 164/89  Pulse: (!) 104 (!) 101 99 99  Resp: 20 20 18 18   Temp: (!) 97.5 F (36.4 C) 98.1 F (36.7 C) 97.7 F (36.5 C) (!) 97.5 F (36.4 C)  TempSrc: Oral Oral Oral Oral  SpO2: 95% 97% 97% 99%  Weight:      Height:        Intake/Output Summary (Last 24 hours) at 09/10/16 1247 Last data filed at 09/10/16 0527  Gross per 24 hour  Intake              500 ml  Output  1325 ml  Net             -825 ml   Filed Weights   09/08/16 1527 09/09/16 0130  Weight: 76.2 kg (168 lb) 82.1 kg (180 lb 14.4 oz)    Examination: General exam: Appears comfortable  HEENT: PERRLA, oral mucosa moist, no sclera icterus or thrush Respiratory system: Clear to auscultation. Respiratory effort normal. Cardiovascular system: S1 & S2 heard, RRR.  No murmurs  Gastrointestinal system: Abdomen soft, non-tender, nondistended. Normal bowel sound. No organomegaly Central nervous system: Alert and oriented. No focal neurological deficits. Extremities: No cyanosis, clubbing or edema Skin: No rashes or ulcers Psychiatry:  Sleepy     Data Reviewed: I have personally reviewed following labs and imaging studies  CBC:  Recent Labs Lab 09/08/16 1926 09/09/16 0359 09/10/16 0326  WBC 14.7* 14.4* 13.0*  NEUTROABS  13.0* 12.9*  --   HGB 12.7* 11.9* 11.4*  HCT 37.3* 36.0* 34.5*  MCV 94.2 94.7 95.3  PLT 219 235 355   Basic Metabolic Panel:  Recent Labs Lab 09/08/16 1926 09/09/16 0359 09/10/16 0326  NA 130* 133* 134*  K 3.9 3.7 3.7  CL 94* 97* 99*  CO2 24 25 27   GLUCOSE 140* 114* 148*  BUN 39* 39* 26*  CREATININE 1.63* 1.19 0.91  CALCIUM 9.5 8.9 8.6*   GFR: Estimated Creatinine Clearance: 93.9 mL/min (by C-G formula based on SCr of 0.91 mg/dL). Liver Function Tests:  Recent Labs Lab 09/08/16 1926 09/09/16 0359  AST 47* 41  ALT 35 31  ALKPHOS 73 72  BILITOT 1.2 1.0  PROT 6.9 6.6  ALBUMIN 2.7* 2.3*   No results for input(s): LIPASE, AMYLASE in the last 168 hours. No results for input(s): AMMONIA in the last 168 hours. Coagulation Profile: No results for input(s): INR, PROTIME in the last 168 hours. Cardiac Enzymes: No results for input(s): CKTOTAL, CKMB, CKMBINDEX, TROPONINI in the last 168 hours. BNP (last 3 results) No results for input(s): PROBNP in the last 8760 hours. HbA1C: No results for input(s): HGBA1C in the last 72 hours. CBG:  Recent Labs Lab 09/09/16 0138 09/09/16 0606 09/09/16 1614 09/10/16 0028 09/10/16 0819  GLUCAP 120* 118* 127* 119* 115*   Lipid Profile: No results for input(s): CHOL, HDL, LDLCALC, TRIG, CHOLHDL, LDLDIRECT in the last 72 hours. Thyroid Function Tests: No results for input(s): TSH, T4TOTAL, FREET4, T3FREE, THYROIDAB in the last 72 hours. Anemia Panel: No results for input(s): VITAMINB12, FOLATE, FERRITIN, TIBC, IRON, RETICCTPCT in the last 72 hours. Urine analysis:    Component Value Date/Time   COLORURINE AMBER (A) 09/08/2016 1934   APPEARANCEUR HAZY (A) 09/08/2016 1934   LABSPEC 1.025 09/08/2016 1934   PHURINE 5.0 09/08/2016 1934   GLUCOSEU NEGATIVE 09/08/2016 1934   HGBUR MODERATE (A) 09/08/2016 1934   BILIRUBINUR NEGATIVE 09/08/2016 1934   KETONESUR NEGATIVE 09/08/2016 1934   PROTEINUR 100 (A) 09/08/2016 1934    UROBILINOGEN 0.2 05/25/2007 1944   NITRITE NEGATIVE 09/08/2016 1934   LEUKOCYTESUR NEGATIVE 09/08/2016 1934   Sepsis Labs: @LABRCNTIP (procalcitonin:4,lacticidven:4) ) Recent Results (from the past 240 hour(s))  Body fluid culture     Status: None (Preliminary result)   Collection Time: 09/09/16 12:49 PM  Result Value Ref Range Status   Specimen Description FLUID RIGHT HIP  Final   Special Requests Normal  Final   Gram Stain   Final    MODERATE WBC PRESENT, PREDOMINANTLY PMN MODERATE GRAM POSITIVE COCCI IN PAIRS IN CLUSTERS    Culture   Final  MODERATE STAPHYLOCOCCUS AUREUS SUSCEPTIBILITIES TO FOLLOW    Report Status PENDING  Incomplete  Aerobic/Anaerobic Culture (surgical/deep wound)     Status: None (Preliminary result)   Collection Time: 09/09/16  2:17 PM  Result Value Ref Range Status   Specimen Description ABSCESS RIGHT PELVIS  Final   Special Requests Normal  Final   Gram Stain   Final    ABUNDANT WBC PRESENT, PREDOMINANTLY PMN ABUNDANT GRAM POSITIVE COCCI IN CLUSTERS    Culture PENDING  Incomplete   Report Status PENDING  Incomplete         Radiology Studies: Dg Chest 2 View  Result Date: 09/08/2016 CLINICAL DATA:  Shortness of Breath EXAM: CHEST  2 VIEW COMPARISON:  05/25/2007 FINDINGS: The heart size and mediastinal contours are within normal limits. Both lungs are clear. The visualized skeletal structures are unremarkable. IMPRESSION: No active cardiopulmonary disease. Electronically Signed   By: Inez Catalina M.D.   On: 09/08/2016 18:19   Ct Head Wo Contrast  Result Date: 09/08/2016 CLINICAL DATA:  Neck pain, weakness and confusion.  Fell 1 week ago. EXAM: CT HEAD WITHOUT CONTRAST CT CERVICAL SPINE WITHOUT CONTRAST TECHNIQUE: Multidetector CT imaging of the head and cervical spine was performed following the standard protocol without intravenous contrast. Multiplanar CT image reconstructions of the cervical spine were also generated. COMPARISON:  None.  FINDINGS: CT HEAD FINDINGS Brain: Diffusely enlarged ventricles and subarachnoid spaces. No intracranial hemorrhage, mass lesion or CT evidence of acute infarction. Vascular: No hyperdense vessel or unexpected calcification. Skull: Normal. Negative for fracture or focal lesion. Sinuses/Orbits: Mild bilateral inferior frontal and anterior ethmoid sinus mucosal thickening. Unremarkable orbits. Other: None. CT CERVICAL SPINE FINDINGS Alignment: Reversal of the normal cervical lordosis. Mild anterolisthesis at the C3-4 and C4-5 levels. Skull base and vertebrae: No acute fracture. No primary bone lesion or focal pathologic process. Soft tissues and spinal canal: No prevertebral fluid or swelling. No visible canal hematoma. Disc levels: Multilevel degenerative changes. These include facet degenerative changes throughout the cervical spine. Upper chest: Mild biapical pleural and parenchymal scarring. Other: Bilateral carotid artery calcifications. IMPRESSION: 1. No skull fracture or intracranial hemorrhage. 2. No cervical spine fracture or traumatic subluxation. 3. Mild diffuse cerebral atrophy. 4. Multilevel cervical spine degenerative changes. These include facet degenerative changes with associated mild anterolisthesis at the C3-4 C4-5 levels. 5. Dense bilateral carotid artery atheromatous calcifications. 6. Minimal chronic bilateral frontal and bilateral ethmoid sinusitis. Electronically Signed   By: Claudie Revering M.D.   On: 09/08/2016 18:08   Ct Cervical Spine Wo Contrast  Result Date: 09/08/2016 CLINICAL DATA:  Neck pain, weakness and confusion.  Fell 1 week ago. EXAM: CT HEAD WITHOUT CONTRAST CT CERVICAL SPINE WITHOUT CONTRAST TECHNIQUE: Multidetector CT imaging of the head and cervical spine was performed following the standard protocol without intravenous contrast. Multiplanar CT image reconstructions of the cervical spine were also generated. COMPARISON:  None. FINDINGS: CT HEAD FINDINGS Brain: Diffusely  enlarged ventricles and subarachnoid spaces. No intracranial hemorrhage, mass lesion or CT evidence of acute infarction. Vascular: No hyperdense vessel or unexpected calcification. Skull: Normal. Negative for fracture or focal lesion. Sinuses/Orbits: Mild bilateral inferior frontal and anterior ethmoid sinus mucosal thickening. Unremarkable orbits. Other: None. CT CERVICAL SPINE FINDINGS Alignment: Reversal of the normal cervical lordosis. Mild anterolisthesis at the C3-4 and C4-5 levels. Skull base and vertebrae: No acute fracture. No primary bone lesion or focal pathologic process. Soft tissues and spinal canal: No prevertebral fluid or swelling. No visible canal hematoma. Disc levels: Multilevel  degenerative changes. These include facet degenerative changes throughout the cervical spine. Upper chest: Mild biapical pleural and parenchymal scarring. Other: Bilateral carotid artery calcifications. IMPRESSION: 1. No skull fracture or intracranial hemorrhage. 2. No cervical spine fracture or traumatic subluxation. 3. Mild diffuse cerebral atrophy. 4. Multilevel cervical spine degenerative changes. These include facet degenerative changes with associated mild anterolisthesis at the C3-4 C4-5 levels. 5. Dense bilateral carotid artery atheromatous calcifications. 6. Minimal chronic bilateral frontal and bilateral ethmoid sinusitis. Electronically Signed   By: Claudie Revering M.D.   On: 09/08/2016 18:08   Ct Lumbar Spine Wo Contrast  Result Date: 09/08/2016 CLINICAL DATA:  Chronic low back pain and left leg numbness history of bone spurs EXAM: CT LUMBAR SPINE WITHOUT CONTRAST TECHNIQUE: Multidetector CT imaging of the lumbar spine was performed without intravenous contrast administration. Multiplanar CT image reconstructions were also generated. COMPARISON:  MRI 09/08/2016, radiograph 09/03/2016 FINDINGS: Segmentation: 5 lumbar type vertebrae. Alignment: Grade 1 anterolisthesis of L4 on L5. Lumbar alignment otherwise  within normal limits. Vertebrae: Mild irregularity anterior superior endplate at L5 corresponding to MRI fracture. Paraspinal and other soft tissues: Aortic atherosclerosis. Partially visualized hypodense mass, likely intramuscular within the right pelvis. Disc levels: At T12-L1, no significant canal stenosis or disc disease. The foramen are patent bilaterally. At L2-L3, no significant disc disease or canal stenosis. The foramen are patent bilaterally. At L2-L3, no significant disc disease or canal stenosis. No significant canal stenosis or bony foraminal narrowing. Posterior facet arthropathy At L3-L4, no significant disc disease or canal stenosis. No significant bony foraminal narrowing. Posterior facet arthropathy At L4-L5, mild disc space narrowing with vacuum disc. Moderate to large disc extrusion with marked canal stenosis, and lateral extension of disc material. Bilateral facet hypertrophy. At L5-S1, no significant canal stenosis or focal disc disease. The foramen are patent bilaterally. IMPRESSION: 1. Minimal irregularity anterior superior endplate at L5, corresponding to subtle compression fracture noted on comparison MRI. L4 posttraumatic changes on MRI not well visualized by CT. No fracture of the posterior elements. 2. 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 Bilateral foraminal impingement. See MRI report. 3. Partially visualized hypodense mass within the right iliacus. Uncertain if this represents a hematoma or soft tissue mass. There is some edema and soft tissue stranding around the right psoas muscle, further evaluation with pelvic CT could be obtained if desired. Electronically Signed   By: Donavan Foil M.D.   On: 09/08/2016 21:11   Ct Pelvis Wo Contrast  Result Date: 09/08/2016 CLINICAL DATA:  Chronic low back pain and left leg numbness. Recent diagnosis of bone spurs in the lumbar region. EXAM: CT  PELVIS WITHOUT CONTRAST TECHNIQUE: Multidetector CT imaging of the pelvis was performed following the standard protocol without intravenous contrast. COMPARISON:  None. FINDINGS: Urinary Tract: Bladder wall is not thickened and no filling defects are identified. Bowel: Visualized portions of colon and small bowel are not abnormally distended. Stool-filled colon. Appendix is normal. Vascular/Lymphatic: Calcifications in the aorta and iliac vessels. No aneurysm. Reproductive: Prostate gland is enlarged, measuring about 4.8 cm diameter. Other: There is infiltration along the right iliopsoas margin with a low-attenuation expansile focus demonstrated in the iliacus muscle measuring about 2.7 x 4 cm in diameter. Appearance is suspicious for any iliopsoas abscess with adjacent inflammatory stranding. Visualization of the area around the right hip is limited due to streak artifact from right hip arthroplasty, but there appears to be small air-fluid  levels with fluid collection anterior to the right hip which probably represents extension of the abscess. Musculoskeletal: Postoperative changes with right total hip arthroplasty. Visualized components appear well seated although visualization is limited due to streak artifact. No evidence of acute fracture or dislocation as visualized. SI joints and symphysis pubis are not displaced. Visualized sacrum and pelvis appear intact. Degenerative changes noted in the lower lumbar spine and in the left hip. IMPRESSION: 1. Inflammatory infiltration in around the right iliopsoas muscle with loculated iliacus muscle abscess and probable extension of the abscess anterior to the right hip arthroplasty. 2. Right total hip arthroplasty. 3. No acute bony abnormalities identified. 4. Degenerative changes in the lower lumbar spine and left hip. Electronically Signed   By: Lucienne Capers M.D.   On: 09/08/2016 20:58   Mr Lumbar Spine Wo Contrast  Result Date: 09/08/2016 CLINICAL DATA:   Patient fell 2 days ago, increasing LEFT leg pain and weakness. EXAM: MRI LUMBAR SPINE WITHOUT CONTRAST TECHNIQUE: Multiplanar, multisequence MR imaging of the lumbar spine was performed. No intravenous contrast was administered. COMPARISON:  Plain films 09/03/2016. FINDINGS: Segmentation:  Standard. Alignment:  5 mm anterolisthesis L4-5. Vertebrae: There is a subtle superior endplate compression fracture of L5 anteriorly. Bone marrow edema is also seen in L4 vertebral body inferiorly. Conus medullaris: Extends to the L1 level and appears normal. Paraspinal and other soft tissues: There is a large hematoma in the RIGHT iliacus muscle. This is incompletely evaluated on axial images, as its inferior extent is not clearly identified, but cross-sectional measurements on image 35 are 25 x 39 mm. Disc levels: L1-L2:  Normal. L2-L3:  Normal. L3-L4:  Normal. L4-L5: 5 mm anterolisthesis is facet mediated. There is marked posterior element hypertrophy. There is a large disc extrusion with a cephalad migrated free fragment. Severe spinal stenosis is present. BILATERAL subarticular zone and foraminal zone narrowing affect the L4 and L5 nerve roots. Superimposed LEFT lateral extrusion at L4-5 in the extraforaminal compartment was present previously. L5-S1: Unremarkable disc space. Facet arthropathy. No impingement. IMPRESSION: Constellation of findings suggesting a minor posttraumatic superior endplate fracture at L5. There is also slight bone marrow edema noted inferiorly at L4, which could be due to the recent fall. Recommend CT lumbar spine without contrast to look for an occult fracture of the posterior elements. 5 mm anterolisthesis at L4-5 in conjunction with posterior element hypertrophy and a large extruded disc fragment contribute to severe spinal stenosis and BILATERAL L4 and L5 nerve root impingement. The stenosis and neural impingement have significantly progressed from prior MR of 06/08/2015. Large RIGHT iliacus  hematoma? This too is likely posttraumatic although in the setting of history of malignant melanoma, a metastasis cannot completely be excluded. Recommend CT pelvis with contrast, for further evaluation. Electronically Signed   By: Staci Righter M.D.   On: 09/08/2016 18:54   Mr Lumbar Spine W Wo Contrast (assess For Abscess, Cord Compression)  Result Date: 09/09/2016 CLINICAL DATA:  Increasing low back pain with difficulty ambulating. Leg pain and weakness. Recent fall. EXAM: MRI LUMBAR SPINE WITHOUT AND WITH CONTRAST TECHNIQUE: Multiplanar and multiecho pulse sequences of the lumbar spine were obtained without and with intravenous contrast. CONTRAST:  20 mL MultiHance COMPARISON:  Lumbar spine MRI and CT 09/08/2016 FINDINGS: The study is moderately motion degraded, particularly on axial sequences. Segmentation:  Standard. Alignment:  Unchanged grade 1 anterolisthesis of L4 on L5. Vertebrae: An L5 superior endplate compression fracture with minimal vertebral body height loss and mild endplate edema is  unchanged from yesterday's MRI. Mild edema along the L4 inferior endplate and in the posterior L4 vertebral body is also unchanged without a frank compression fracture deformity. No abnormal fluid signal or enhancement is seen in the L4-5 disc space to indicate discitis, and there is also vacuum disc phenomenon which is a negative predictor for infection. There is a small ventral epidural fluid collection in the midline beginning at the mid L4 vertebral body level just above the extruded disc and extending superiorly to the mid L1 vertebral body level. There is no significant associated enhancement aside from a small amount at L4 just above the disc. Conus medullaris: Extends to the L1-2 level and appears normal. Paraspinal and other soft tissues: There are multiple fluid collections within the posterior paraspinal soft tissues, right much greater than left. These extend to the posterior margins of the facet joints  on the right from L2-L5 and on the left from L3-L5 with mild surrounding enhancement. There is underlying chronic facet joint arthritis at these levels with facet joint effusions bilaterally, most notable on the left at L4-5. However, no significant marrow edema/ enhancement or osseous destructive changes are seen on MRI or CT to clearly indicate septic facet arthritis. As previously described on MRI and CT, there is a T1 hyperintense collection in the right iliacus muscle, incompletely visualized. Disc levels: Detailed assessment of lumbar degenerative changes is deferred to the recent, less motion degraded MRI. The small ventral epidural collection from L1-L4 does not appear grossly enlarged compared to that study though it does contribute to mild multifactorial spinal stenosis at L2-3 and L3-4, partially congenital in nature with posterior element hypertrophy and mildly prominent dorsal epidural fat contributing. Severe spinal stenosis at L4-5 due to anterolisthesis, central disc extrusion, ligamentum flavum thickening, and advanced facet arthrosis does not appear significantly changed, nor does severe right and moderate left neural foraminal stenosis. IMPRESSION: 1. Motion degraded examination. 2. Unchanged mild L5 superior endplate compression fracture and mild L4 vertebral body edema without significant height loss. No evidence of infectious discitis. 3. Grade 1 anterolisthesis of L4 on L5 with unchanged disc extrusion and severe spinal stenosis. Small ventral epidural fluid collection from L1-L4 is without associated enhancement and is not felt to represent abscess but may instead be related to the disc extrusion. 4. Multiple fluid collections with mild surrounding enhancement in the posterior paraspinal soft tissues/musculature, right greater than left. Some of these extend to/communicate with the facet joints. Infection/abscesses are possible, however no osseous changes strongly suggestive of septic facet  arthritis are identified and it is possible that these collections reflect underlying degenerative facet arthritis with joint effusions/synovial cysts and soft tissue/intramuscular hematomas related to the recent fall. 5. Partially visualized fluid collection in the right iliacus muscle as previously described, possibly hematoma though abscess is not excluded. Electronically Signed   By: Logan Bores M.D.   On: 09/09/2016 10:43   Ct Aspiration  Result Date: 09/09/2016 CLINICAL DATA:  Back pain. MR demonstrates right iliacus and iliopsoas bursal fluid collections. Patient has a right hip prosthesis and aspiration is requested to exclude septic joint. EXAM: CT GUIDED ASPIRATION BIOPSY OF RIGHT HIP ANESTHESIA/SEDATION: Intravenous Fentanyl and Versed were administered as conscious sedation during continuous monitoring of the patient's level of consciousness and physiological / cardiorespiratory status by the radiology RN, with a total moderate sedation time of 15 minutes. PROCEDURE: The procedure risks, benefits, and alternatives were explained to the patient. Questions regarding the procedure were encouraged and answered. The patient understands  and consents to the procedure. Select axial scans through the right hip were obtained. An appropriate skin entry site was determined and marked. The operative field was prepped with chlorhexidinein a sterile fashion, and a sterile drape was applied covering the operative field. A sterile gown and sterile gloves were used for the procedure. Local anesthesia was provided with 1% Lidocaine. Under CT fluoroscopic guidance, an 18 gauge spinal needle was advanced to the anterior aspect of the prosthetic femoral head using a steep anterolateral approach in order to avoid the distended iliopsoas bursa. Initial aspiration returned no joint fluid. After lavage with 5 mL nonbacteriostatic saline, a scant amount of clear yellow joint fluid was aspirated, sent for Gram stain and  culture. The patient tolerated the procedure well. COMPLICATIONS: None immediate FINDINGS: No significant fluid returned on initial aspiration. A scant amount of fluid was returned post lavage, sent for Gram stain and culture as above. IMPRESSION: 1. Technically successful right hip aspiration under CT guidance Electronically Signed   By: Lucrezia Europe M.D.   On: 09/09/2016 13:45   Ct Image Guided Drainage By Percutaneous Catheter  Result Date: 09/09/2016 CLINICAL DATA:  Back pain. MR and CT suggests right iliacus abscess. EXAM: CT GUIDED DRAINAGE OF RIGHT PELVIC ABSCESS ANESTHESIA/SEDATION: Intravenous Fentanyl and Versed were administered as conscious sedation during continuous monitoring of the patient's level of consciousness and physiological / cardiorespiratory status by the radiology RN, with a total moderate sedation time of 30 minutes. PROCEDURE: The procedure, risks, benefits, and alternatives were explained to the patient. Questions regarding the procedure were encouraged and answered. The patient understands and consents to the procedure. Select axial scans through the pelvis were obtained. The collection was localized and an appropriate skin entry site was determined and marked. The operative field was prepped with chlorhexidinein a sterile fashion, and a sterile drape was applied covering the operative field. A sterile gown and sterile gloves were used for the procedure. Local anesthesia was provided with 1% Lidocaine. Under CT fluoroscopic guidance, a 19 gauge percutaneous entry needle was advanced into the collection. A small amount purulent material was aspirated. An Amplatz guidewire advanced easily within the collection, its position confirmed on CT. Tract dilated to facilitate placement of a 12 French pigtail catheter, formed within the dependent aspect of the collection. 30 mL of purulent fluid were aspirated, a sample sent for Gram stain and culture. Catheter secured externally with 0 Prolene  suture and StatLock and placed to gravity drain bag. The patient tolerated the procedure well. COMPLICATIONS: None immediate FINDINGS: The right iliacus low-attenuation collection was localized. Aspiration returned purulent material. A 12 French drain catheter was placed as above. IMPRESSION: 1. Technically successful CT-guided pelvic abscess drain catheter placement. Electronically Signed   By: Lucrezia Europe M.D.   On: 09/09/2016 14:15      Scheduled Meds: . mouth rinse  15 mL Mouth Rinse BID  . metoprolol succinate  50 mg Oral Daily  . simvastatin  20 mg Oral Daily  . sodium chloride flush  5 mL Intravenous Q8H  . temazepam  30 mg Oral QHS   Continuous Infusions: . methocarbamol (ROBAXIN)  IV    . piperacillin-tazobactam (ZOSYN)  IV Stopped (09/10/16 0949)  . vancomycin Stopped (09/10/16 0315)     LOS: 1 day    Time spent in minutes: 35    Debbe Odea, MD [facility] Pager: [email or web address] Password [facility] 09/10/2016, 12:47 PM

## 2016-09-10 NOTE — Progress Notes (Signed)
Chief Complaint: Patient was seen today for follow up abscess drain  Supervising Physician: Arne Cleveland  Patient Status: [facility] - In-pt  Subjective: S/p RLQ perc drain to iliacus abscess Pt subjectively looks more comfortable this am. He says it's a positional thing.  Objective: Physical Exam: BP (!) 161/80 (BP Location: Left Arm)   Pulse (!) 104   Temp (!) 97.5 F (36.4 C) (Oral)   Resp 20   Ht 6\' 1"  (1.854 m)   Wt 180 lb 14.4 oz (82.1 kg)   SpO2 95%   BMI 23.87 kg/m  RLQ drain intact, site clean Output purulent   Current Facility-Administered Medications:  .  acetaminophen (TYLENOL) tablet 650 mg, 650 mg, Oral, Q6H PRN **OR** acetaminophen (TYLENOL) suppository 650 mg, 650 mg, Rectal, Q6H PRN, Rise Patience, MD .  diazepam (VALIUM) tablet 5 mg, 5 mg, Oral, Q8H PRN, Rise Patience, MD, 5 mg at 09/09/16 0201 .  MEDLINE mouth rinse, 15 mL, Mouth Rinse, BID, Rise Patience, MD, 15 mL at 09/10/16 0000 .  methocarbamol (ROBAXIN) 500 mg in dextrose 5 % 50 mL IVPB, 500 mg, Intravenous, Q6H PRN, Rise Patience, MD .  metoprolol succinate (TOPROL-XL) 24 hr tablet 50 mg, 50 mg, Oral, Daily, Rise Patience, MD, 50 mg at 09/09/16 1001 .  morphine 4 MG/ML injection 2 mg, 2 mg, Intravenous, Q4H PRN, Rise Patience, MD, 2 mg at 09/09/16 2151 .  ondansetron (ZOFRAN) tablet 4 mg, 4 mg, Oral, Q6H PRN **OR** ondansetron (ZOFRAN) injection 4 mg, 4 mg, Intravenous, Q6H PRN, Rise Patience, MD, 4 mg at 09/09/16 0155 .  piperacillin-tazobactam (ZOSYN) IVPB 3.375 g, 3.375 g, Intravenous, Q8H, Bryk, Veronda P, RPH, Last Rate: 12.5 mL/hr at 09/10/16 0527, 3.375 g at 09/10/16 0527 .  simvastatin (ZOCOR) tablet 20 mg, 20 mg, Oral, Daily, Rise Patience, MD, 20 mg at 09/09/16 1810 .  temazepam (RESTORIL) capsule 30 mg, 30 mg, Oral, QHS, Rise Patience, MD, 30 mg at 09/09/16 2151 .  vancomycin (VANCOCIN) IVPB 1000 mg/200 mL premix, 1,000 mg,  Intravenous, Fayrene Fearing, RPH, Stopped at 09/10/16 0315  Labs: CBC  Recent Labs  09/09/16 0359 09/10/16 0326  WBC 14.4* 13.0*  HGB 11.9* 11.4*  HCT 36.0* 34.5*  PLT 235 272   BMET  Recent Labs  09/09/16 0359 09/10/16 0326  NA 133* 134*  K 3.7 3.7  CL 97* 99*  CO2 25 27  GLUCOSE 114* 148*  BUN 39* 26*  CREATININE 1.19 0.91  CALCIUM 8.9 8.6*   LFT  Recent Labs  09/09/16 0359  PROT 6.6  ALBUMIN 2.3*  AST 41  ALT 31  ALKPHOS 72  BILITOT 1.0   PT/INR No results for input(s): LABPROT, INR in the last 72 hours.   Studies/Results: Dg Chest 2 View  Result Date: 09/08/2016 CLINICAL DATA:  Shortness of Breath EXAM: CHEST  2 VIEW COMPARISON:  05/25/2007 FINDINGS: The heart size and mediastinal contours are within normal limits. Both lungs are clear. The visualized skeletal structures are unremarkable. IMPRESSION: No active cardiopulmonary disease. Electronically Signed   By: Inez Catalina M.D.   On: 09/08/2016 18:19   Ct Head Wo Contrast  Result Date: 09/08/2016 CLINICAL DATA:  Neck pain, weakness and confusion.  Fell 1 week ago. EXAM: CT HEAD WITHOUT CONTRAST CT CERVICAL SPINE WITHOUT CONTRAST TECHNIQUE: Multidetector CT imaging of the head and cervical spine was performed following the standard protocol without intravenous contrast. Multiplanar CT image reconstructions  of the cervical spine were also generated. COMPARISON:  None. FINDINGS: CT HEAD FINDINGS Brain: Diffusely enlarged ventricles and subarachnoid spaces. No intracranial hemorrhage, mass lesion or CT evidence of acute infarction. Vascular: No hyperdense vessel or unexpected calcification. Skull: Normal. Negative for fracture or focal lesion. Sinuses/Orbits: Mild bilateral inferior frontal and anterior ethmoid sinus mucosal thickening. Unremarkable orbits. Other: None. CT CERVICAL SPINE FINDINGS Alignment: Reversal of the normal cervical lordosis. Mild anterolisthesis at the C3-4 and C4-5 levels. Skull  base and vertebrae: No acute fracture. No primary bone lesion or focal pathologic process. Soft tissues and spinal canal: No prevertebral fluid or swelling. No visible canal hematoma. Disc levels: Multilevel degenerative changes. These include facet degenerative changes throughout the cervical spine. Upper chest: Mild biapical pleural and parenchymal scarring. Other: Bilateral carotid artery calcifications. IMPRESSION: 1. No skull fracture or intracranial hemorrhage. 2. No cervical spine fracture or traumatic subluxation. 3. Mild diffuse cerebral atrophy. 4. Multilevel cervical spine degenerative changes. These include facet degenerative changes with associated mild anterolisthesis at the C3-4 C4-5 levels. 5. Dense bilateral carotid artery atheromatous calcifications. 6. Minimal chronic bilateral frontal and bilateral ethmoid sinusitis. Electronically Signed   By: Claudie Revering M.D.   On: 09/08/2016 18:08   Ct Cervical Spine Wo Contrast  Result Date: 09/08/2016 CLINICAL DATA:  Neck pain, weakness and confusion.  Fell 1 week ago. EXAM: CT HEAD WITHOUT CONTRAST CT CERVICAL SPINE WITHOUT CONTRAST TECHNIQUE: Multidetector CT imaging of the head and cervical spine was performed following the standard protocol without intravenous contrast. Multiplanar CT image reconstructions of the cervical spine were also generated. COMPARISON:  None. FINDINGS: CT HEAD FINDINGS Brain: Diffusely enlarged ventricles and subarachnoid spaces. No intracranial hemorrhage, mass lesion or CT evidence of acute infarction. Vascular: No hyperdense vessel or unexpected calcification. Skull: Normal. Negative for fracture or focal lesion. Sinuses/Orbits: Mild bilateral inferior frontal and anterior ethmoid sinus mucosal thickening. Unremarkable orbits. Other: None. CT CERVICAL SPINE FINDINGS Alignment: Reversal of the normal cervical lordosis. Mild anterolisthesis at the C3-4 and C4-5 levels. Skull base and vertebrae: No acute fracture. No primary  bone lesion or focal pathologic process. Soft tissues and spinal canal: No prevertebral fluid or swelling. No visible canal hematoma. Disc levels: Multilevel degenerative changes. These include facet degenerative changes throughout the cervical spine. Upper chest: Mild biapical pleural and parenchymal scarring. Other: Bilateral carotid artery calcifications. IMPRESSION: 1. No skull fracture or intracranial hemorrhage. 2. No cervical spine fracture or traumatic subluxation. 3. Mild diffuse cerebral atrophy. 4. Multilevel cervical spine degenerative changes. These include facet degenerative changes with associated mild anterolisthesis at the C3-4 C4-5 levels. 5. Dense bilateral carotid artery atheromatous calcifications. 6. Minimal chronic bilateral frontal and bilateral ethmoid sinusitis. Electronically Signed   By: Claudie Revering M.D.   On: 09/08/2016 18:08   Ct Lumbar Spine Wo Contrast  Result Date: 09/08/2016 CLINICAL DATA:  Chronic low back pain and left leg numbness history of bone spurs EXAM: CT LUMBAR SPINE WITHOUT CONTRAST TECHNIQUE: Multidetector CT imaging of the lumbar spine was performed without intravenous contrast administration. Multiplanar CT image reconstructions were also generated. COMPARISON:  MRI 09/08/2016, radiograph 09/03/2016 FINDINGS: Segmentation: 5 lumbar type vertebrae. Alignment: Grade 1 anterolisthesis of L4 on L5. Lumbar alignment otherwise within normal limits. Vertebrae: Mild irregularity anterior superior endplate at L5 corresponding to MRI fracture. Paraspinal and other soft tissues: Aortic atherosclerosis. Partially visualized hypodense mass, likely intramuscular within the right pelvis. Disc levels: At T12-L1, no significant canal stenosis or disc disease. The foramen are patent bilaterally. At  L2-L3, no significant disc disease or canal stenosis. The foramen are patent bilaterally. At L2-L3, no significant disc disease or canal stenosis. No significant canal stenosis or bony  foraminal narrowing. Posterior facet arthropathy At L3-L4, no significant disc disease or canal stenosis. No significant bony foraminal narrowing. Posterior facet arthropathy At L4-L5, mild disc space narrowing with vacuum disc. Moderate to large disc extrusion with marked canal stenosis, and lateral extension of disc material. Bilateral facet hypertrophy. At L5-S1, no significant canal stenosis or focal disc disease. The foramen are patent bilaterally. IMPRESSION: 1. Minimal irregularity anterior superior endplate at L5, corresponding to subtle compression fracture noted on comparison MRI. L4 posttraumatic changes on MRI not well visualized by CT. No fracture of the posterior elements. 2. 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 Bilateral foraminal impingement. See MRI report. 3. Partially visualized hypodense mass within the right iliacus. Uncertain if this represents a hematoma or soft tissue mass. There is some edema and soft tissue stranding around the right psoas muscle, further evaluation with pelvic CT could be obtained if desired. Electronically Signed   By: Donavan Foil M.D.   On: 09/08/2016 21:11   Ct Pelvis Wo Contrast  Result Date: 09/08/2016 CLINICAL DATA:  Chronic low back pain and left leg numbness. Recent diagnosis of bone spurs in the lumbar region. EXAM: CT PELVIS WITHOUT CONTRAST TECHNIQUE: Multidetector CT imaging of the pelvis was performed following the standard protocol without intravenous contrast. COMPARISON:  None. FINDINGS: Urinary Tract: Bladder wall is not thickened and no filling defects are identified. Bowel: Visualized portions of colon and small bowel are not abnormally distended. Stool-filled colon. Appendix is normal. Vascular/Lymphatic: Calcifications in the aorta and iliac vessels. No aneurysm. Reproductive: Prostate gland is enlarged, measuring about 4.8 cm diameter. Other: There  is infiltration along the right iliopsoas margin with a low-attenuation expansile focus demonstrated in the iliacus muscle measuring about 2.7 x 4 cm in diameter. Appearance is suspicious for any iliopsoas abscess with adjacent inflammatory stranding. Visualization of the area around the right hip is limited due to streak artifact from right hip arthroplasty, but there appears to be small air-fluid levels with fluid collection anterior to the right hip which probably represents extension of the abscess. Musculoskeletal: Postoperative changes with right total hip arthroplasty. Visualized components appear well seated although visualization is limited due to streak artifact. No evidence of acute fracture or dislocation as visualized. SI joints and symphysis pubis are not displaced. Visualized sacrum and pelvis appear intact. Degenerative changes noted in the lower lumbar spine and in the left hip. IMPRESSION: 1. Inflammatory infiltration in around the right iliopsoas muscle with loculated iliacus muscle abscess and probable extension of the abscess anterior to the right hip arthroplasty. 2. Right total hip arthroplasty. 3. No acute bony abnormalities identified. 4. Degenerative changes in the lower lumbar spine and left hip. Electronically Signed   By: Lucienne Capers M.D.   On: 09/08/2016 20:58   Mr Lumbar Spine Wo Contrast  Result Date: 09/08/2016 CLINICAL DATA:  Patient fell 2 days ago, increasing LEFT leg pain and weakness. EXAM: MRI LUMBAR SPINE WITHOUT CONTRAST TECHNIQUE: Multiplanar, multisequence MR imaging of the lumbar spine was performed. No intravenous contrast was administered. COMPARISON:  Plain films 09/03/2016. FINDINGS: Segmentation:  Standard. Alignment:  5 mm anterolisthesis L4-5. Vertebrae: There is a subtle superior endplate compression fracture of L5 anteriorly. Bone marrow edema is also seen in L4 vertebral body  inferiorly. Conus medullaris: Extends to the L1 level and appears normal.  Paraspinal and other soft tissues: There is a large hematoma in the RIGHT iliacus muscle. This is incompletely evaluated on axial images, as its inferior extent is not clearly identified, but cross-sectional measurements on image 35 are 25 x 39 mm. Disc levels: L1-L2:  Normal. L2-L3:  Normal. L3-L4:  Normal. L4-L5: 5 mm anterolisthesis is facet mediated. There is marked posterior element hypertrophy. There is a large disc extrusion with a cephalad migrated free fragment. Severe spinal stenosis is present. BILATERAL subarticular zone and foraminal zone narrowing affect the L4 and L5 nerve roots. Superimposed LEFT lateral extrusion at L4-5 in the extraforaminal compartment was present previously. L5-S1: Unremarkable disc space. Facet arthropathy. No impingement. IMPRESSION: Constellation of findings suggesting a minor posttraumatic superior endplate fracture at L5. There is also slight bone marrow edema noted inferiorly at L4, which could be due to the recent fall. Recommend CT lumbar spine without contrast to look for an occult fracture of the posterior elements. 5 mm anterolisthesis at L4-5 in conjunction with posterior element hypertrophy and a large extruded disc fragment contribute to severe spinal stenosis and BILATERAL L4 and L5 nerve root impingement. The stenosis and neural impingement have significantly progressed from prior MR of 06/08/2015. Large RIGHT iliacus hematoma? This too is likely posttraumatic although in the setting of history of malignant melanoma, a metastasis cannot completely be excluded. Recommend CT pelvis with contrast, for further evaluation. Electronically Signed   By: Staci Righter M.D.   On: 09/08/2016 18:54   Mr Lumbar Spine W Wo Contrast (assess For Abscess, Cord Compression)  Result Date: 09/09/2016 CLINICAL DATA:  Increasing low back pain with difficulty ambulating. Leg pain and weakness. Recent fall. EXAM: MRI LUMBAR SPINE WITHOUT AND WITH CONTRAST TECHNIQUE: Multiplanar and  multiecho pulse sequences of the lumbar spine were obtained without and with intravenous contrast. CONTRAST:  20 mL MultiHance COMPARISON:  Lumbar spine MRI and CT 09/08/2016 FINDINGS: The study is moderately motion degraded, particularly on axial sequences. Segmentation:  Standard. Alignment:  Unchanged grade 1 anterolisthesis of L4 on L5. Vertebrae: An L5 superior endplate compression fracture with minimal vertebral body height loss and mild endplate edema is unchanged from yesterday's MRI. Mild edema along the L4 inferior endplate and in the posterior L4 vertebral body is also unchanged without a frank compression fracture deformity. No abnormal fluid signal or enhancement is seen in the L4-5 disc space to indicate discitis, and there is also vacuum disc phenomenon which is a negative predictor for infection. There is a small ventral epidural fluid collection in the midline beginning at the mid L4 vertebral body level just above the extruded disc and extending superiorly to the mid L1 vertebral body level. There is no significant associated enhancement aside from a small amount at L4 just above the disc. Conus medullaris: Extends to the L1-2 level and appears normal. Paraspinal and other soft tissues: There are multiple fluid collections within the posterior paraspinal soft tissues, right much greater than left. These extend to the posterior margins of the facet joints on the right from L2-L5 and on the left from L3-L5 with mild surrounding enhancement. There is underlying chronic facet joint arthritis at these levels with facet joint effusions bilaterally, most notable on the left at L4-5. However, no significant marrow edema/ enhancement or osseous destructive changes are seen on MRI or CT to clearly indicate septic facet arthritis. As previously described on MRI and CT, there is a T1 hyperintense  collection in the right iliacus muscle, incompletely visualized. Disc levels: Detailed assessment of lumbar  degenerative changes is deferred to the recent, less motion degraded MRI. The small ventral epidural collection from L1-L4 does not appear grossly enlarged compared to that study though it does contribute to mild multifactorial spinal stenosis at L2-3 and L3-4, partially congenital in nature with posterior element hypertrophy and mildly prominent dorsal epidural fat contributing. Severe spinal stenosis at L4-5 due to anterolisthesis, central disc extrusion, ligamentum flavum thickening, and advanced facet arthrosis does not appear significantly changed, nor does severe right and moderate left neural foraminal stenosis. IMPRESSION: 1. Motion degraded examination. 2. Unchanged mild L5 superior endplate compression fracture and mild L4 vertebral body edema without significant height loss. No evidence of infectious discitis. 3. Grade 1 anterolisthesis of L4 on L5 with unchanged disc extrusion and severe spinal stenosis. Small ventral epidural fluid collection from L1-L4 is without associated enhancement and is not felt to represent abscess but may instead be related to the disc extrusion. 4. Multiple fluid collections with mild surrounding enhancement in the posterior paraspinal soft tissues/musculature, right greater than left. Some of these extend to/communicate with the facet joints. Infection/abscesses are possible, however no osseous changes strongly suggestive of septic facet arthritis are identified and it is possible that these collections reflect underlying degenerative facet arthritis with joint effusions/synovial cysts and soft tissue/intramuscular hematomas related to the recent fall. 5. Partially visualized fluid collection in the right iliacus muscle as previously described, possibly hematoma though abscess is not excluded. Electronically Signed   By: Logan Bores M.D.   On: 09/09/2016 10:43   Ct Aspiration  Result Date: 09/09/2016 CLINICAL DATA:  Back pain. MR demonstrates right iliacus and iliopsoas  bursal fluid collections. Patient has a right hip prosthesis and aspiration is requested to exclude septic joint. EXAM: CT GUIDED ASPIRATION BIOPSY OF RIGHT HIP ANESTHESIA/SEDATION: Intravenous Fentanyl and Versed were administered as conscious sedation during continuous monitoring of the patient's level of consciousness and physiological / cardiorespiratory status by the radiology RN, with a total moderate sedation time of 15 minutes. PROCEDURE: The procedure risks, benefits, and alternatives were explained to the patient. Questions regarding the procedure were encouraged and answered. The patient understands and consents to the procedure. Select axial scans through the right hip were obtained. An appropriate skin entry site was determined and marked. The operative field was prepped with chlorhexidinein a sterile fashion, and a sterile drape was applied covering the operative field. A sterile gown and sterile gloves were used for the procedure. Local anesthesia was provided with 1% Lidocaine. Under CT fluoroscopic guidance, an 18 gauge spinal needle was advanced to the anterior aspect of the prosthetic femoral head using a steep anterolateral approach in order to avoid the distended iliopsoas bursa. Initial aspiration returned no joint fluid. After lavage with 5 mL nonbacteriostatic saline, a scant amount of clear yellow joint fluid was aspirated, sent for Gram stain and culture. The patient tolerated the procedure well. COMPLICATIONS: None immediate FINDINGS: No significant fluid returned on initial aspiration. A scant amount of fluid was returned post lavage, sent for Gram stain and culture as above. IMPRESSION: 1. Technically successful right hip aspiration under CT guidance Electronically Signed   By: Lucrezia Europe M.D.   On: 09/09/2016 13:45   Ct Image Guided Drainage By Percutaneous Catheter  Result Date: 09/09/2016 CLINICAL DATA:  Back pain. MR and CT suggests right iliacus abscess. EXAM: CT GUIDED DRAINAGE  OF RIGHT PELVIC ABSCESS ANESTHESIA/SEDATION: Intravenous Fentanyl and Versed  were administered as conscious sedation during continuous monitoring of the patient's level of consciousness and physiological / cardiorespiratory status by the radiology RN, with a total moderate sedation time of 30 minutes. PROCEDURE: The procedure, risks, benefits, and alternatives were explained to the patient. Questions regarding the procedure were encouraged and answered. The patient understands and consents to the procedure. Select axial scans through the pelvis were obtained. The collection was localized and an appropriate skin entry site was determined and marked. The operative field was prepped with chlorhexidinein a sterile fashion, and a sterile drape was applied covering the operative field. A sterile gown and sterile gloves were used for the procedure. Local anesthesia was provided with 1% Lidocaine. Under CT fluoroscopic guidance, a 19 gauge percutaneous entry needle was advanced into the collection. A small amount purulent material was aspirated. An Amplatz guidewire advanced easily within the collection, its position confirmed on CT. Tract dilated to facilitate placement of a 12 French pigtail catheter, formed within the dependent aspect of the collection. 30 mL of purulent fluid were aspirated, a sample sent for Gram stain and culture. Catheter secured externally with 0 Prolene suture and StatLock and placed to gravity drain bag. The patient tolerated the procedure well. COMPLICATIONS: None immediate FINDINGS: The right iliacus low-attenuation collection was localized. Aspiration returned purulent material. A 12 French drain catheter was placed as above. IMPRESSION: 1. Technically successful CT-guided pelvic abscess drain catheter placement. Electronically Signed   By: Lucrezia Europe M.D.   On: 09/09/2016 14:15    Assessment/Plan: S/p (R)iliacus abscess drain WBC down some Cx pending IR following    LOS: 1 day   I  spent a total of 15 minutes in face to face in clinical consultation, greater than 50% of which was counseling/coordinating care for iliacus abscess drain  Jeryl Umholtz PA-C 09/10/2016 9:24 AM

## 2016-09-11 ENCOUNTER — Inpatient Hospital Stay (HOSPITAL_COMMUNITY): Payer: BLUE CROSS/BLUE SHIELD

## 2016-09-11 LAB — BASIC METABOLIC PANEL
ANION GAP: 9 (ref 5–15)
BUN: 22 mg/dL — ABNORMAL HIGH (ref 6–20)
CHLORIDE: 97 mmol/L — AB (ref 101–111)
CO2: 30 mmol/L (ref 22–32)
CREATININE: 0.92 mg/dL (ref 0.61–1.24)
Calcium: 8.7 mg/dL — ABNORMAL LOW (ref 8.9–10.3)
GFR calc non Af Amer: >60 mL/min (ref >60)
Glucose, Bld: 120 mg/dL — ABNORMAL HIGH (ref 65–99)
Potassium: 3.3 mmol/L — ABNORMAL LOW (ref 3.5–5.1)
SODIUM: 136 mmol/L (ref 135–145)

## 2016-09-11 LAB — CBC
HCT: 36.6 % — ABNORMAL LOW (ref 39.0–52.0)
Hemoglobin: 12 g/dL — ABNORMAL LOW (ref 13.0–17.0)
MCH: 31.9 pg (ref 26.0–34.0)
MCHC: 32.8 g/dL (ref 30.0–36.0)
MCV: 97.3 fL (ref 78.0–100.0)
PLATELETS: 325 10*3/uL (ref 150–400)
RBC: 3.76 MIL/uL — AB (ref 4.22–5.81)
RDW: 13.3 % (ref 11.5–15.5)
WBC: 12.4 10*3/uL — AB (ref 4.0–10.5)

## 2016-09-11 LAB — GLUCOSE, CAPILLARY
Glucose-Capillary: 133 mg/dL — ABNORMAL HIGH (ref 65–99)
Glucose-Capillary: 139 mg/dL — ABNORMAL HIGH (ref 65–99)
Glucose-Capillary: 117 mg/dL — ABNORMAL HIGH (ref 65–99)
Glucose-Capillary: 96 mg/dL (ref 65–99)

## 2016-09-11 LAB — VANCOMYCIN, TROUGH: VANCOMYCIN TR: 10 ug/mL — AB (ref 15–20)

## 2016-09-11 MED ORDER — OXYCODONE HCL 5 MG PO TABS
10.0000 mg | ORAL_TABLET | Freq: Four times a day (QID) | ORAL | Status: DC | PRN
  Administered 2016-09-12 (×2): 10 mg via ORAL
  Filled 2016-09-11 (×2): qty 2

## 2016-09-11 MED ORDER — CEFAZOLIN SODIUM-DEXTROSE 1-4 GM/50ML-% IV SOLN
1.0000 g | Freq: Three times a day (TID) | INTRAVENOUS | Status: DC
  Administered 2016-09-11 – 2016-09-13 (×6): 1 g via INTRAVENOUS
  Filled 2016-09-11 (×8): qty 50

## 2016-09-11 MED ORDER — NICOTINE 14 MG/24HR TD PT24
14.0000 mg | MEDICATED_PATCH | Freq: Every day | TRANSDERMAL | Status: DC
  Administered 2016-09-11 – 2016-09-15 (×5): 14 mg via TRANSDERMAL
  Filled 2016-09-11 (×5): qty 1

## 2016-09-11 MED ORDER — OXYCODONE HCL 5 MG PO TABS: 5.0000 mg | ORAL_TABLET | Freq: Four times a day (QID) | ORAL | Status: DC | PRN

## 2016-09-11 MED ORDER — POTASSIUM CHLORIDE CRYS ER 20 MEQ PO TBCR
40.0000 meq | EXTENDED_RELEASE_TABLET | Freq: Once | ORAL | Status: AC
  Administered 2016-09-11: 40 meq via ORAL
  Filled 2016-09-11: qty 2

## 2016-09-11 NOTE — Telephone Encounter (Signed)
Please advise 

## 2016-09-11 NOTE — Telephone Encounter (Signed)
Patient aware Rx ready at the front desk  

## 2016-09-11 NOTE — Progress Notes (Signed)
Subjective: Patient reports some back pain and leg weakness still. He is still confused and disoriented. He is able to follow commands with repetitive redirecting.   Objective: Vital signs in last 24 hours: Temp:  [97.5 F (36.4 C)-98.7 F (37.1 C)] 97.5 F (36.4 C) (07/30 0942) Pulse Rate:  [99-125] 103 (07/30 0942) Resp:  [18-20] 20 (07/30 0942) BP: (131-167)/(72-97) 144/85 (07/30 0942) SpO2:  [97 %-100 %] 98 % (07/30 0942)  Intake/Output from previous day: 07/29 0701 - 07/30 0700 In: 465 [P.O.:240; IV Piggyback:225] Out: 30 [Drains:30] Intake/Output this shift: No intake/output data recorded.  Neurologic: Confused, MAE. BLE 4/5.   Lab Results: Lab Results  Component Value Date   WBC 12.4 (H) 09/11/2016   HGB 12.0 (L) 09/11/2016   HCT 36.6 (L) 09/11/2016   MCV 97.3 09/11/2016   PLT 325 09/11/2016   No results found for: INR, PROTIME BMET Lab Results  Component Value Date   NA 136 09/11/2016   K 3.3 (L) 09/11/2016   CL 97 (L) 09/11/2016   CO2 30 09/11/2016   GLUCOSE 120 (H) 09/11/2016   BUN 22 (H) 09/11/2016   CREATININE 0.92 09/11/2016   CALCIUM 8.7 (L) 09/11/2016    Studies/Results: Ct Aspiration  Result Date: 09/09/2016 CLINICAL DATA:  Back pain. MR demonstrates right iliacus and iliopsoas bursal fluid collections. Patient has a right hip prosthesis and aspiration is requested to exclude septic joint. EXAM: CT GUIDED ASPIRATION BIOPSY OF RIGHT HIP ANESTHESIA/SEDATION: Intravenous Fentanyl and Versed were administered as conscious sedation during continuous monitoring of the patient's level of consciousness and physiological / cardiorespiratory status by the radiology RN, with a total moderate sedation time of 15 minutes. PROCEDURE: The procedure risks, benefits, and alternatives were explained to the patient. Questions regarding the procedure were encouraged and answered. The patient understands and consents to the procedure. Select axial scans through the right  hip were obtained. An appropriate skin entry site was determined and marked. The operative field was prepped with chlorhexidinein a sterile fashion, and a sterile drape was applied covering the operative field. A sterile gown and sterile gloves were used for the procedure. Local anesthesia was provided with 1% Lidocaine. Under CT fluoroscopic guidance, an 18 gauge spinal needle was advanced to the anterior aspect of the prosthetic femoral head using a steep anterolateral approach in order to avoid the distended iliopsoas bursa. Initial aspiration returned no joint fluid. After lavage with 5 mL nonbacteriostatic saline, a scant amount of clear yellow joint fluid was aspirated, sent for Gram stain and culture. The patient tolerated the procedure well. COMPLICATIONS: None immediate FINDINGS: No significant fluid returned on initial aspiration. A scant amount of fluid was returned post lavage, sent for Gram stain and culture as above. IMPRESSION: 1. Technically successful right hip aspiration under CT guidance Electronically Signed   By: Lucrezia Europe M.D.   On: 09/09/2016 13:45   Ct Image Guided Drainage By Percutaneous Catheter  Result Date: 09/09/2016 CLINICAL DATA:  Back pain. MR and CT suggests right iliacus abscess. EXAM: CT GUIDED DRAINAGE OF RIGHT PELVIC ABSCESS ANESTHESIA/SEDATION: Intravenous Fentanyl and Versed were administered as conscious sedation during continuous monitoring of the patient's level of consciousness and physiological / cardiorespiratory status by the radiology RN, with a total moderate sedation time of 30 minutes. PROCEDURE: The procedure, risks, benefits, and alternatives were explained to the patient. Questions regarding the procedure were encouraged and answered. The patient understands and consents to the procedure. Select axial scans through the pelvis were obtained. The  collection was localized and an appropriate skin entry site was determined and marked. The operative field was  prepped with chlorhexidinein a sterile fashion, and a sterile drape was applied covering the operative field. A sterile gown and sterile gloves were used for the procedure. Local anesthesia was provided with 1% Lidocaine. Under CT fluoroscopic guidance, a 19 gauge percutaneous entry needle was advanced into the collection. A small amount purulent material was aspirated. An Amplatz guidewire advanced easily within the collection, its position confirmed on CT. Tract dilated to facilitate placement of a 12 French pigtail catheter, formed within the dependent aspect of the collection. 30 mL of purulent fluid were aspirated, a sample sent for Gram stain and culture. Catheter secured externally with 0 Prolene suture and StatLock and placed to gravity drain bag. The patient tolerated the procedure well. COMPLICATIONS: None immediate FINDINGS: The right iliacus low-attenuation collection was localized. Aspiration returned purulent material. A 12 French drain catheter was placed as above. IMPRESSION: 1. Technically successful CT-guided pelvic abscess drain catheter placement. Electronically Signed   By: Lucrezia Europe M.D.   On: 09/09/2016 14:15    Assessment/Plan:  He did have the psoas abscess drained by IR yesterday. Results pending. He is currently on antibiotics. Still not a surgical candidate at this time because of his infection.    LOS: 2 days    Ocie Cornfield Anaclara Acklin 09/11/2016, 11:50 AM

## 2016-09-11 NOTE — Consult Note (Signed)
ORTHOPAEDIC CONSULTATION  REQUESTING PHYSICIAN: Debbe Odea, MD  PCP:  [name], MD  Chief Complaint: Right hip pain  HPI: Jon Choi is a 66 y.o. male who was admitted for iliopsoas infection with abscess after become acutely septic.  This has thus been managed with IR drain placement into the abscess.  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  He is currently improving clinically with the drain placed and iv abx.  He continues to be confused on my exam and history is obtained mostly from chart review.  Of note he has been having increasing BLE numbness and pain for which he is being seen by [facility] for and also had a recent ESI before this clinical presentation.  He denies groin pain but does endorse right buttock pain.  He continues to have some night sweats but overall states he is improving.  Past Medical History:  Diagnosis Date  . Back pain    History reviewed. No pertinent surgical history. Social History   Social History  . Marital status: Married    Spouse name: N/A  . Number of children: N/A  . Years of education: N/A   Social History Main Topics  . Smoking status: Former Research scientist (life sciences)  . Smokeless tobacco: Never Used  . Alcohol use Yes  . Drug use: No  . Sexual activity: Not Asked   Other Topics Concern  . None   Social History Narrative  . None   Family History  Problem Relation Age of Onset  . Hypertension Other    No Known Allergies Prior to Admission medications   Medication Sig Start Date End Date Taking? Authorizing Provider  celecoxib (CELEBREX) 200 MG capsule Take 200 mg by mouth daily.  08/19/16  Yes [provider]  diazepam (VALIUM) 5 MG tablet Take 1 tablet (5 mg total) by mouth every 8 (eight) hours as needed for muscle spasms.  09/03/16  Yes Virgel Manifold, MD  metoprolol succinate (TOPROL-XL) 50 MG 24 hr tablet Take 50 mg by mouth daily.    Yes [provider]  naproxen sodium (ANAPROX) 220 MG tablet Take 440 mg by mouth daily as needed (pain).   Yes [provider]  oxyCODONE-acetaminophen (PERCOCET/ROXICET) 5-325 MG tablet Take 1-2 tablets by mouth every 6 (six) hours as needed for severe pain. Patient taking differently: Take 1 tablet by mouth every 8 (eight) hours as needed for severe pain.  09/03/16  Yes Virgel Manifold, MD  simvastatin (ZOCOR) 20 MG tablet Take 20 mg by mouth daily.   Yes [provider]  temazepam (RESTORIL) 30 MG capsule Take 30 mg by mouth at bedtime.  08/24/16  Yes [provider]  tiZANidine (ZANAFLEX) 4 MG tablet Take 1 tablet (4 mg total) by mouth every 8 (eight) hours as needed for muscle spasms. 08/30/16  Yes Mcarthur Rossetti, MD  traMADol (ULTRAM) 50 MG tablet TAKE 1 TO 2 TABLETS BY MOUTH TWICE A DAY AS NEEDED FOR PAIN. Patient taking differently: TAKE 50MG -100MG  BY MOUTH EVERY 8 HOURS AS NEEDED FOR PAIN. 09/08/16  Yes Mcarthur Rossetti, MD  HYDROcodone-acetaminophen (NORCO) 10-325 MG tablet TAKE 1 TABLET BY MOUTH EVERY 6 HOURS AS NEEDED. 09/11/16   Mcarthur Rossetti, MD   No results found.  Positive  ROS: All other systems have been reviewed and were otherwise negative with the exception of those mentioned in the HPI and as above.  Physical Exam: General: , no acute distress Cardiovascular: No pedal edema Respiratory: No cyanosis, no use of accessory musculature GI: No organomegaly, abdomen is soft and non-tender Skin: diaphoretic Lymphatic: No axillary or cervical lymphadenopathy  MUSCULOSKELETAL:  Drain bag on anterior thigh with purulent drainage in bag RLE- no pain with logroll or Stinchfield maneuver.  Globally weak in Right and left leg but on right has motor intact with TA/GSC/FHL/EHL.  Endorse SILT dp/sp/sur/saph/TN but  diminshed.  2+ DP pulse  Assessment: Culture positive infected right total hip  Plan: -currently not having any subjective findings of right hip pathology.  It looks like the right hip fluid was obtained after lavage and there was not significant effusion to draw on aspiration, thus there was no cell count to review.  However, cultures have now grown MSSA from the right hip, which was likely seated from the psoas abscess -I am going to review this case with one of our Hip arthroplasty specialist and update the primary team as to our direction for care of the right hip moving forward.  He may very well require at least a I and D and poly exchange.  So will make NPO tonight at [facility].   -hip xrays ordered -will follow    Nicholes Stairs, MD Cell [phone]    09/11/2016 4:19 PM

## 2016-09-11 NOTE — Progress Notes (Signed)
Jon Choi wife is requesting a nicotine patch for him. She states "he normally vapes 18mg " MD notified of request. Will continue to monitor

## 2016-09-11 NOTE — Progress Notes (Signed)
Drain flushed X2 this shift. Will continue to monitor.

## 2016-09-11 NOTE — Progress Notes (Signed)
Patient seen today. Right Hip aspiration performed yesterday positive for non-methicillin-resistant staph aureus . Dr. Matilde Haymaker placed the hip replacement in 2007. He has done well with that until this current hospitalization. Dr. Ninfa Linden was seeing him only for back related issues. I discussed this with Dr. Charlestine Night medical assistant who will inform the physician on call.

## 2016-09-11 NOTE — Progress Notes (Signed)
Patient ID: Jon Choi, male   DOB: 1950-03-14, 66 y.o.   MRN: [id number]    Referring Physician(s): Dr. Debbe Odea  Supervising Physician: Marybelle Killings  Patient Status: [facility] - In-pt  Chief Complaint: Right iliacus abscess  Subjective: Patient confused.  Allergies: Patient has no known allergies.  Medications: Prior to Admission medications   Medication Sig Start Date End Date Taking? Authorizing Provider  celecoxib (CELEBREX) 200 MG capsule Take 200 mg by mouth daily.  08/19/16  Yes [provider]  diazepam (VALIUM) 5 MG tablet Take 1 tablet (5 mg total) by mouth every 8 (eight) hours as needed for muscle spasms. 09/03/16  Yes Virgel Manifold, MD  metoprolol succinate (TOPROL-XL) 50 MG 24 hr tablet Take 50 mg by mouth daily.    Yes [provider]  naproxen sodium (ANAPROX) 220 MG tablet Take 440 mg by mouth daily as needed (pain).   Yes [provider]  oxyCODONE-acetaminophen (PERCOCET/ROXICET) 5-325 MG tablet Take 1-2 tablets by mouth every 6 (six) hours as needed for severe pain. Patient taking differently: Take 1 tablet by mouth every 8 (eight) hours as needed for severe pain.  09/03/16  Yes Virgel Manifold, MD  simvastatin (ZOCOR) 20 MG tablet Take 20 mg by mouth daily.   Yes [provider]  temazepam (RESTORIL) 30 MG capsule Take 30 mg by mouth at bedtime.  08/24/16  Yes [provider]  tiZANidine (ZANAFLEX) 4 MG tablet Take 1 tablet (4 mg total) by mouth every 8 (eight) hours as needed for muscle spasms. 08/30/16  Yes Mcarthur Rossetti, MD  traMADol (ULTRAM) 50 MG tablet TAKE 1 TO 2 TABLETS BY MOUTH TWICE A DAY AS NEEDED FOR PAIN. Patient taking differently: TAKE 50MG -100MG  BY MOUTH EVERY 8 HOURS AS NEEDED FOR PAIN. 09/08/16  Yes Mcarthur Rossetti, MD  HYDROcodone-acetaminophen (NORCO) 10-325 MG tablet TAKE 1 TABLET BY MOUTH EVERY 6 HOURS AS NEEDED. Patient not taking: Reported on 09/08/2016 09/08/16   Mcarthur Rossetti, MD    Vital Signs: BP (!) 144/85 (BP Location: Left Arm)   Pulse (!) 103   Temp (!) 97.5 F (36.4 C) (Oral)   Resp 20   Ht 6\' 1"  (1.854 m)   Wt 180 lb 14.4 oz (82.1 kg)   SpO2 98%   BMI 23.87 kg/m   Physical Exam: Abd: right posterior abdominal drain in place with brown purulent appearing drainage.  30cc documented yesterday.  Drain site is c/d/i  Imaging: Dg Chest 2 View  Result Date: 09/08/2016 CLINICAL DATA:  Shortness of Breath EXAM: CHEST  2 VIEW COMPARISON:  05/25/2007 FINDINGS: The heart size and mediastinal contours are within normal limits. Both lungs are clear. The visualized skeletal structures are unremarkable. IMPRESSION: No active cardiopulmonary disease. Electronically Signed   By: Inez Catalina M.D.   On: 09/08/2016 18:19   Ct Head Wo Contrast  Result Date: 09/08/2016 CLINICAL DATA:  Neck pain, weakness and confusion.  Fell 1 week ago. EXAM: CT HEAD WITHOUT CONTRAST CT CERVICAL SPINE WITHOUT CONTRAST TECHNIQUE: Multidetector CT imaging of the head and cervical spine was performed following the standard protocol without intravenous contrast. Multiplanar CT image reconstructions of the cervical spine were also generated. COMPARISON:  None. FINDINGS: CT HEAD FINDINGS Brain: Diffusely enlarged ventricles and subarachnoid spaces. No intracranial hemorrhage, mass lesion or CT evidence of acute infarction. Vascular: No hyperdense vessel or unexpected calcification. Skull: Normal. Negative for fracture or focal lesion. Sinuses/Orbits: Mild bilateral inferior frontal and anterior ethmoid sinus  mucosal thickening. Unremarkable orbits. Other: None. CT CERVICAL SPINE FINDINGS Alignment: Reversal of the normal cervical lordosis. Mild anterolisthesis at the C3-4 and C4-5 levels. Skull base and vertebrae: No acute fracture. No primary bone lesion or focal pathologic process. Soft tissues and spinal canal: No prevertebral fluid or swelling. No visible canal hematoma. Disc  levels: Multilevel degenerative changes. These include facet degenerative changes throughout the cervical spine. Upper chest: Mild biapical pleural and parenchymal scarring. Other: Bilateral carotid artery calcifications. IMPRESSION: 1. No skull fracture or intracranial hemorrhage. 2. No cervical spine fracture or traumatic subluxation. 3. Mild diffuse cerebral atrophy. 4. Multilevel cervical spine degenerative changes. These include facet degenerative changes with associated mild anterolisthesis at the C3-4 C4-5 levels. 5. Dense bilateral carotid artery atheromatous calcifications. 6. Minimal chronic bilateral frontal and bilateral ethmoid sinusitis. Electronically Signed   By: Claudie Revering M.D.   On: 09/08/2016 18:08   Ct Cervical Spine Wo Contrast  Result Date: 09/08/2016 CLINICAL DATA:  Neck pain, weakness and confusion.  Fell 1 week ago. EXAM: CT HEAD WITHOUT CONTRAST CT CERVICAL SPINE WITHOUT CONTRAST TECHNIQUE: Multidetector CT imaging of the head and cervical spine was performed following the standard protocol without intravenous contrast. Multiplanar CT image reconstructions of the cervical spine were also generated. COMPARISON:  None. FINDINGS: CT HEAD FINDINGS Brain: Diffusely enlarged ventricles and subarachnoid spaces. No intracranial hemorrhage, mass lesion or CT evidence of acute infarction. Vascular: No hyperdense vessel or unexpected calcification. Skull: Normal. Negative for fracture or focal lesion. Sinuses/Orbits: Mild bilateral inferior frontal and anterior ethmoid sinus mucosal thickening. Unremarkable orbits. Other: None. CT CERVICAL SPINE FINDINGS Alignment: Reversal of the normal cervical lordosis. Mild anterolisthesis at the C3-4 and C4-5 levels. Skull base and vertebrae: No acute fracture. No primary bone lesion or focal pathologic process. Soft tissues and spinal canal: No prevertebral fluid or swelling. No visible canal hematoma. Disc levels: Multilevel degenerative changes. These  include facet degenerative changes throughout the cervical spine. Upper chest: Mild biapical pleural and parenchymal scarring. Other: Bilateral carotid artery calcifications. IMPRESSION: 1. No skull fracture or intracranial hemorrhage. 2. No cervical spine fracture or traumatic subluxation. 3. Mild diffuse cerebral atrophy. 4. Multilevel cervical spine degenerative changes. These include facet degenerative changes with associated mild anterolisthesis at the C3-4 C4-5 levels. 5. Dense bilateral carotid artery atheromatous calcifications. 6. Minimal chronic bilateral frontal and bilateral ethmoid sinusitis. Electronically Signed   By: Claudie Revering M.D.   On: 09/08/2016 18:08   Ct Lumbar Spine Wo Contrast  Result Date: 09/08/2016 CLINICAL DATA:  Chronic low back pain and left leg numbness history of bone spurs EXAM: CT LUMBAR SPINE WITHOUT CONTRAST TECHNIQUE: Multidetector CT imaging of the lumbar spine was performed without intravenous contrast administration. Multiplanar CT image reconstructions were also generated. COMPARISON:  MRI 09/08/2016, radiograph 09/03/2016 FINDINGS: Segmentation: 5 lumbar type vertebrae. Alignment: Grade 1 anterolisthesis of L4 on L5. Lumbar alignment otherwise within normal limits. Vertebrae: Mild irregularity anterior superior endplate at L5 corresponding to MRI fracture. Paraspinal and other soft tissues: Aortic atherosclerosis. Partially visualized hypodense mass, likely intramuscular within the right pelvis. Disc levels: At T12-L1, no significant canal stenosis or disc disease. The foramen are patent bilaterally. At L2-L3, no significant disc disease or canal stenosis. The foramen are patent bilaterally. At L2-L3, no significant disc disease or canal stenosis. No significant canal stenosis or bony foraminal narrowing. Posterior facet arthropathy At L3-L4, no significant disc disease or canal stenosis. No significant bony foraminal narrowing. Posterior facet arthropathy At L4-L5,  mild disc space  narrowing with vacuum disc. Moderate to large disc extrusion with marked canal stenosis, and lateral extension of disc material. Bilateral facet hypertrophy. At L5-S1, no significant canal stenosis or focal disc disease. The foramen are patent bilaterally. IMPRESSION: 1. Minimal irregularity anterior superior endplate at L5, corresponding to subtle compression fracture noted on comparison MRI. L4 posttraumatic changes on MRI not well visualized by CT. No fracture of the posterior elements. 2. 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 Bilateral foraminal impingement. See MRI report. 3. Partially visualized hypodense mass within the right iliacus. Uncertain if this represents a hematoma or soft tissue mass. There is some edema and soft tissue stranding around the right psoas muscle, further evaluation with pelvic CT could be obtained if desired. Electronically Signed   By: Donavan Foil M.D.   On: 09/08/2016 21:11   Ct Pelvis Wo Contrast  Result Date: 09/08/2016 CLINICAL DATA:  Chronic low back pain and left leg numbness. Recent diagnosis of bone spurs in the lumbar region. EXAM: CT PELVIS WITHOUT CONTRAST TECHNIQUE: Multidetector CT imaging of the pelvis was performed following the standard protocol without intravenous contrast. COMPARISON:  None. FINDINGS: Urinary Tract: Bladder wall is not thickened and no filling defects are identified. Bowel: Visualized portions of colon and small bowel are not abnormally distended. Stool-filled colon. Appendix is normal. Vascular/Lymphatic: Calcifications in the aorta and iliac vessels. No aneurysm. Reproductive: Prostate gland is enlarged, measuring about 4.8 cm diameter. Other: There is infiltration along the right iliopsoas margin with a low-attenuation expansile focus demonstrated in the iliacus muscle measuring about 2.7 x 4 cm in diameter. Appearance is  suspicious for any iliopsoas abscess with adjacent inflammatory stranding. Visualization of the area around the right hip is limited due to streak artifact from right hip arthroplasty, but there appears to be small air-fluid levels with fluid collection anterior to the right hip which probably represents extension of the abscess. Musculoskeletal: Postoperative changes with right total hip arthroplasty. Visualized components appear well seated although visualization is limited due to streak artifact. No evidence of acute fracture or dislocation as visualized. SI joints and symphysis pubis are not displaced. Visualized sacrum and pelvis appear intact. Degenerative changes noted in the lower lumbar spine and in the left hip. IMPRESSION: 1. Inflammatory infiltration in around the right iliopsoas muscle with loculated iliacus muscle abscess and probable extension of the abscess anterior to the right hip arthroplasty. 2. Right total hip arthroplasty. 3. No acute bony abnormalities identified. 4. Degenerative changes in the lower lumbar spine and left hip. Electronically Signed   By: Lucienne Capers M.D.   On: 09/08/2016 20:58   Mr Lumbar Spine Wo Contrast  Result Date: 09/08/2016 CLINICAL DATA:  Patient fell 2 days ago, increasing LEFT leg pain and weakness. EXAM: MRI LUMBAR SPINE WITHOUT CONTRAST TECHNIQUE: Multiplanar, multisequence MR imaging of the lumbar spine was performed. No intravenous contrast was administered. COMPARISON:  Plain films 09/03/2016. FINDINGS: Segmentation:  Standard. Alignment:  5 mm anterolisthesis L4-5. Vertebrae: There is a subtle superior endplate compression fracture of L5 anteriorly. Bone marrow edema is also seen in L4 vertebral body inferiorly. Conus medullaris: Extends to the L1 level and appears normal. Paraspinal and other soft tissues: There is a large hematoma in the RIGHT iliacus muscle. This is incompletely evaluated on axial images, as its inferior extent is not clearly  identified, but cross-sectional measurements on image 35 are 25 x 39 mm. Disc levels: L1-L2:  Normal. L2-L3:  Normal. L3-L4:  Normal. L4-L5: 5 mm anterolisthesis is facet mediated. There is marked posterior element hypertrophy. There is a large disc extrusion with a cephalad migrated free fragment. Severe spinal stenosis is present. BILATERAL subarticular zone and foraminal zone narrowing affect the L4 and L5 nerve roots. Superimposed LEFT lateral extrusion at L4-5 in the extraforaminal compartment was present previously. L5-S1: Unremarkable disc space. Facet arthropathy. No impingement. IMPRESSION: Constellation of findings suggesting a minor posttraumatic superior endplate fracture at L5. There is also slight bone marrow edema noted inferiorly at L4, which could be due to the recent fall. Recommend CT lumbar spine without contrast to look for an occult fracture of the posterior elements. 5 mm anterolisthesis at L4-5 in conjunction with posterior element hypertrophy and a large extruded disc fragment contribute to severe spinal stenosis and BILATERAL L4 and L5 nerve root impingement. The stenosis and neural impingement have significantly progressed from prior MR of 06/08/2015. Large RIGHT iliacus hematoma? This too is likely posttraumatic although in the setting of history of malignant melanoma, a metastasis cannot completely be excluded. Recommend CT pelvis with contrast, for further evaluation. Electronically Signed   By: Staci Righter M.D.   On: 09/08/2016 18:54   Mr Lumbar Spine W Wo Contrast (assess For Abscess, Cord Compression)  Result Date: 09/09/2016 CLINICAL DATA:  Increasing low back pain with difficulty ambulating. Leg pain and weakness. Recent fall. EXAM: MRI LUMBAR SPINE WITHOUT AND WITH CONTRAST TECHNIQUE: Multiplanar and multiecho pulse sequences of the lumbar spine were obtained without and with intravenous contrast. CONTRAST:  20 mL MultiHance COMPARISON:  Lumbar spine MRI and CT 09/08/2016  FINDINGS: The study is moderately motion degraded, particularly on axial sequences. Segmentation:  Standard. Alignment:  Unchanged grade 1 anterolisthesis of L4 on L5. Vertebrae: An L5 superior endplate compression fracture with minimal vertebral body height loss and mild endplate edema is unchanged from yesterday's MRI. Mild edema along the L4 inferior endplate and in the posterior L4 vertebral body is also unchanged without a frank compression fracture deformity. No abnormal fluid signal or enhancement is seen in the L4-5 disc space to indicate discitis, and there is also vacuum disc phenomenon which is a negative predictor for infection. There is a small ventral epidural fluid collection in the midline beginning at the mid L4 vertebral body level just above the extruded disc and extending superiorly to the mid L1 vertebral body level. There is no significant associated enhancement aside from a small amount at L4 just above the disc. Conus medullaris: Extends to the L1-2 level and appears normal. Paraspinal and other soft tissues: There are multiple fluid collections within the posterior paraspinal soft tissues, right much greater than left. These extend to the posterior margins of the facet joints on the right from L2-L5 and on the left from L3-L5 with mild surrounding enhancement. There is underlying chronic facet joint arthritis at these levels with facet joint effusions bilaterally, most notable on the left at L4-5. However, no significant marrow edema/ enhancement or osseous destructive changes are seen on MRI or CT to clearly indicate septic facet arthritis. As previously described on MRI and CT, there is a T1 hyperintense collection in the right iliacus muscle, incompletely visualized. Disc levels: Detailed assessment of lumbar degenerative changes is deferred to the recent, less motion degraded MRI. The small ventral epidural collection from L1-L4 does not appear grossly enlarged compared to that study  though it does contribute to mild multifactorial spinal stenosis at L2-3 and L3-4, partially congenital  in nature with posterior element hypertrophy and mildly prominent dorsal epidural fat contributing. Severe spinal stenosis at L4-5 due to anterolisthesis, central disc extrusion, ligamentum flavum thickening, and advanced facet arthrosis does not appear significantly changed, nor does severe right and moderate left neural foraminal stenosis. IMPRESSION: 1. Motion degraded examination. 2. Unchanged mild L5 superior endplate compression fracture and mild L4 vertebral body edema without significant height loss. No evidence of infectious discitis. 3. Grade 1 anterolisthesis of L4 on L5 with unchanged disc extrusion and severe spinal stenosis. Small ventral epidural fluid collection from L1-L4 is without associated enhancement and is not felt to represent abscess but may instead be related to the disc extrusion. 4. Multiple fluid collections with mild surrounding enhancement in the posterior paraspinal soft tissues/musculature, right greater than left. Some of these extend to/communicate with the facet joints. Infection/abscesses are possible, however no osseous changes strongly suggestive of septic facet arthritis are identified and it is possible that these collections reflect underlying degenerative facet arthritis with joint effusions/synovial cysts and soft tissue/intramuscular hematomas related to the recent fall. 5. Partially visualized fluid collection in the right iliacus muscle as previously described, possibly hematoma though abscess is not excluded. Electronically Signed   By: Logan Bores M.D.   On: 09/09/2016 10:43   Ct Aspiration  Result Date: 09/09/2016 CLINICAL DATA:  Back pain. MR demonstrates right iliacus and iliopsoas bursal fluid collections. Patient has a right hip prosthesis and aspiration is requested to exclude septic joint. EXAM: CT GUIDED ASPIRATION BIOPSY OF RIGHT HIP  ANESTHESIA/SEDATION: Intravenous Fentanyl and Versed were administered as conscious sedation during continuous monitoring of the patient's level of consciousness and physiological / cardiorespiratory status by the radiology RN, with a total moderate sedation time of 15 minutes. PROCEDURE: The procedure risks, benefits, and alternatives were explained to the patient. Questions regarding the procedure were encouraged and answered. The patient understands and consents to the procedure. Select axial scans through the right hip were obtained. An appropriate skin entry site was determined and marked. The operative field was prepped with chlorhexidinein a sterile fashion, and a sterile drape was applied covering the operative field. A sterile gown and sterile gloves were used for the procedure. Local anesthesia was provided with 1% Lidocaine. Under CT fluoroscopic guidance, an 18 gauge spinal needle was advanced to the anterior aspect of the prosthetic femoral head using a steep anterolateral approach in order to avoid the distended iliopsoas bursa. Initial aspiration returned no joint fluid. After lavage with 5 mL nonbacteriostatic saline, a scant amount of clear yellow joint fluid was aspirated, sent for Gram stain and culture. The patient tolerated the procedure well. COMPLICATIONS: None immediate FINDINGS: No significant fluid returned on initial aspiration. A scant amount of fluid was returned post lavage, sent for Gram stain and culture as above. IMPRESSION: 1. Technically successful right hip aspiration under CT guidance Electronically Signed   By: Lucrezia Europe M.D.   On: 09/09/2016 13:45   Ct Image Guided Drainage By Percutaneous Catheter  Result Date: 09/09/2016 CLINICAL DATA:  Back pain. MR and CT suggests right iliacus abscess. EXAM: CT GUIDED DRAINAGE OF RIGHT PELVIC ABSCESS ANESTHESIA/SEDATION: Intravenous Fentanyl and Versed were administered as conscious sedation during continuous monitoring of the  patient's level of consciousness and physiological / cardiorespiratory status by the radiology RN, with a total moderate sedation time of 30 minutes. PROCEDURE: The procedure, risks, benefits, and alternatives were explained to the patient. Questions regarding the procedure were encouraged and answered. The patient understands and consents  to the procedure. Select axial scans through the pelvis were obtained. The collection was localized and an appropriate skin entry site was determined and marked. The operative field was prepped with chlorhexidinein a sterile fashion, and a sterile drape was applied covering the operative field. A sterile gown and sterile gloves were used for the procedure. Local anesthesia was provided with 1% Lidocaine. Under CT fluoroscopic guidance, a 19 gauge percutaneous entry needle was advanced into the collection. A small amount purulent material was aspirated. An Amplatz guidewire advanced easily within the collection, its position confirmed on CT. Tract dilated to facilitate placement of a 12 French pigtail catheter, formed within the dependent aspect of the collection. 30 mL of purulent fluid were aspirated, a sample sent for Gram stain and culture. Catheter secured externally with 0 Prolene suture and StatLock and placed to gravity drain bag. The patient tolerated the procedure well. COMPLICATIONS: None immediate FINDINGS: The right iliacus low-attenuation collection was localized. Aspiration returned purulent material. A 12 French drain catheter was placed as above. IMPRESSION: 1. Technically successful CT-guided pelvic abscess drain catheter placement. Electronically Signed   By: Lucrezia Europe M.D.   On: 09/09/2016 14:15    Labs:  CBC:  Recent Labs  09/08/16 1926 09/09/16 0359 09/10/16 0326 09/11/16 0334  WBC 14.7* 14.4* 13.0* 12.4*  HGB 12.7* 11.9* 11.4* 12.0*  HCT 37.3* 36.0* 34.5* 36.6*  PLT 219 235 272 325    COAGS: No results for input(s): INR, APTT in the last  8760 hours.  BMP:  Recent Labs  09/08/16 1926 09/09/16 0359 09/10/16 0326 09/11/16 0334  NA 130* 133* 134* 136  K 3.9 3.7 3.7 3.3*  CL 94* 97* 99* 97*  CO2 24 25 27 30   GLUCOSE 140* 114* 148* 120*  BUN 39* 39* 26* 22*  CALCIUM 9.5 8.9 8.6* 8.7*  CREATININE 1.63* 1.19 0.91 0.92  GFRNONAA 43* >60 >60 >60  GFRAA 50* >60 >60 >60    LIVER FUNCTION TESTS:  Recent Labs  09/08/16 1926 09/09/16 0359  BILITOT 1.2 1.0  AST 47* 41  ALT 35 31  ALKPHOS 73 72  PROT 6.9 6.6  ALBUMIN 2.7* 2.3*    Assessment and Plan: 1. Right iliacus abscess, s/p perc drain on 7/28  Cont with drain catheter for now. CX shows staph aureus.  abx per primary service Continue to follow drain. Cont TID irrigation of drain  Electronically Signed: Jaelynn Pozo E 09/11/2016, 10:27 AM   I spent a total of 15 Minutes at the the patient's bedside AND on the patient's hospital floor or unit, greater than 50% of which was counseling/coordinating care for right iliacus abscess

## 2016-09-11 NOTE — Progress Notes (Addendum)
PROGRESS NOTE    Jon Choi   QVZ:[id number]  DOB: 05/12/1950  DOA: 09/08/2016 PCP: [name], MD   Brief Narrative:  Jon Choi 66 y.o. male with history of hypertension, hyperlipidemia chronic back pain malignant with L4-L5 disc protrusion and spondylolisthesis, melanoma was brought to the ER after patient was having increasing back pain with difficulty ambulating. Imaging reveals a right psoas abscess and severe stenosis of L4-L5 with compression fx at L5.  7/17- received epidural steroid injection by Dr Ernestina Patches (pain management) 7/18- per note by Dr Ninfa Linden, needs referral to Dr Saintclair Halsted, [place] for surgical eval   Subjective:  confused. No complaints.  ROS: no complaints of nausea, vomiting, constipation, diarrhea, cough, dyspnea or dysuria. No other complaints.   Assessment & Plan:   Principal Problem:   Iliopsoas abscess- MSSA- right Hip aspirate showing the same    Sepsis - temp 100.3, WBC 14, HR in 100s - lactic acid normal at 1.48 - IR guided drainage -  wound cultures showing MSSA - d/c  Vanc and Zosyn- started Ancef - blood cultures negative - 30 cc from drain yesterday - ortho following for right hip infection - will ask ID for opinion on duration of anitbiotics  Active Problems: Acute encephalopathy - unfortunately is likely due to narcotics- wife noted it at home in relation to Hydrocodone - 7/29 d/c'd Fentanyl and Valium -  7/30- change Hydrocodone to Oxycodone today- d/c Morphine today - continue to wean Narcotics as able     ARF (acute renal failure)  - due to sepsis, dehydration - has improved  Hyponatremia  - dehydration improved with IVF - IVF on hold now- good urine output  Hypokalemia - replace  Hiccoughs - Thorazine PRN  Hypertension - Metoprolol     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-L5 level - neurosurgery notes that he is current not a candidate for surgery with an ongoing infectious process    HLD (hyperlipidemia) - Zocor  Nicotine  abuse - per wife, he "vapes" - Nicotine patch   DVT prophylaxis: SCDs Code Status: Full code Family Communication:  Disposition Plan:  Consultants:   Neurosurgery  IR Procedures:   CT guided lavage and aspiration of hip and Perc drain of psoas abscess Antimicrobials:  Anti-infectives    Start     Dose/Rate Route Frequency Ordered Stop   09/09/16 1000  vancomycin (VANCOCIN) IVPB 750 mg/150 ml premix  Status:  Discontinued     750 mg 150 mL/hr over 60 Minutes Intravenous Every 12 hours 09/09/16 0108 09/09/16 0853   09/09/16 1000  vancomycin (VANCOCIN) IVPB 1000 mg/200 mL premix     1,000 mg 200 mL/hr over 60 Minutes Intravenous Every 12 hours 09/09/16 0853     09/09/16 0400  piperacillin-tazobactam (ZOSYN) IVPB 3.375 g     3.375 g 12.5 mL/hr over 240 Minutes Intravenous Every 8 hours 09/09/16 0108     09/08/16 2115  piperacillin-tazobactam (ZOSYN) IVPB 3.375 g     3.375 g 100 mL/hr over 30 Minutes Intravenous  Once 09/08/16 2107 09/08/16 2218   09/08/16 2115  vancomycin (VANCOCIN) IVPB 1000 mg/200 mL premix     1,000 mg 200 mL/hr over 60 Minutes Intravenous  Once 09/08/16 2107 09/08/16 2320       Objective: Vitals:   09/11/16 0112 09/11/16 0623 09/11/16 0942 09/11/16 1409  BP: 131/72 137/78 (!) 144/85 136/79  Pulse: (!) 104 (!) 101 (!) 103 100  Resp: 20 20 20 20   Temp: 98.5 F (36.9 C) 98.1  F (36.7 C) (!) 97.5 F (36.4 C) 98 F (36.7 C)  TempSrc: Oral Oral Oral Oral  SpO2: 99% 97% 98% 97%  Weight:      Height:        Intake/Output Summary (Last 24 hours) at 09/11/16 1630 Last data filed at 09/11/16 1500  Gross per 24 hour  Intake              845 ml  Output               50 ml  Net              795 ml   Filed Weights   09/08/16 1527 09/09/16 0130  Weight: 76.2 kg (168 lb) 82.1 kg (180 lb 14.4 oz)    Examination: General exam: Appears comfortable  HEENT: PERRLA, oral mucosa moist, no sclera icterus or thrush Respiratory system: Clear to  auscultation. Respiratory effort normal. Cardiovascular system: S1 & S2 heard, RRR.  No murmurs  Gastrointestinal system: Abdomen soft, non-tender, nondistended. Normal bowel sound. No organomegaly Drain has small amount of yellowish fluid Central nervous system: Alert but disoriented x 2- No focal neurological deficits. Extremities: No cyanosis, clubbing or edema Skin: No rashes or ulcers Psychiatry:   confused    Data Reviewed: I have personally reviewed following labs and imaging studies  CBC:  Recent Labs Lab 09/08/16 1926 09/09/16 0359 09/10/16 0326 09/11/16 0334  WBC 14.7* 14.4* 13.0* 12.4*  NEUTROABS 13.0* 12.9*  --   --   HGB 12.7* 11.9* 11.4* 12.0*  HCT 37.3* 36.0* 34.5* 36.6*  MCV 94.2 94.7 95.3 97.3  PLT 219 235 272 308   Basic Metabolic Panel:  Recent Labs Lab 09/08/16 1926 09/09/16 0359 09/10/16 0326 09/11/16 0334  NA 130* 133* 134* 136  K 3.9 3.7 3.7 3.3*  CL 94* 97* 99* 97*  CO2 24 25 27 30   GLUCOSE 140* 114* 148* 120*  BUN 39* 39* 26* 22*  CREATININE 1.63* 1.19 0.91 0.92  CALCIUM 9.5 8.9 8.6* 8.7*   GFR: Estimated Creatinine Clearance: 92.9 mL/min (by C-G formula based on SCr of 0.92 mg/dL). Liver Function Tests:  Recent Labs Lab 09/08/16 1926 09/09/16 0359  AST 47* 41  ALT 35 31  ALKPHOS 73 72  BILITOT 1.2 1.0  PROT 6.9 6.6  ALBUMIN 2.7* 2.3*   No results for input(s): LIPASE, AMYLASE in the last 168 hours. No results for input(s): AMMONIA in the last 168 hours. Coagulation Profile: No results for input(s): INR, PROTIME in the last 168 hours. Cardiac Enzymes: No results for input(s): CKTOTAL, CKMB, CKMBINDEX, TROPONINI in the last 168 hours. BNP (last 3 results) No results for input(s): PROBNP in the last 8760 hours. HbA1C: No results for input(s): HGBA1C in the last 72 hours. CBG:  Recent Labs Lab 09/10/16 0028 09/10/16 0819 09/10/16 1609 09/11/16 0114 09/11/16 0118  GLUCAP 119* 115* 123* 133* 139*   Lipid Profile: No  results for input(s): CHOL, HDL, LDLCALC, TRIG, CHOLHDL, LDLDIRECT in the last 72 hours. Thyroid Function Tests: No results for input(s): TSH, T4TOTAL, FREET4, T3FREE, THYROIDAB in the last 72 hours. Anemia Panel: No results for input(s): VITAMINB12, FOLATE, FERRITIN, TIBC, IRON, RETICCTPCT in the last 72 hours. Urine analysis:    Component Value Date/Time   COLORURINE AMBER (A) 09/08/2016 1934   APPEARANCEUR HAZY (A) 09/08/2016 1934   LABSPEC 1.025 09/08/2016 1934   PHURINE 5.0 09/08/2016 1934   GLUCOSEU NEGATIVE 09/08/2016 1934   HGBUR MODERATE (A) 09/08/2016 1934  BILIRUBINUR NEGATIVE 09/08/2016 [place] 09/08/2016 1934   PROTEINUR 100 (A) 09/08/2016 1934   UROBILINOGEN 0.2 05/25/2007 1944   NITRITE NEGATIVE 09/08/2016 1934   LEUKOCYTESUR NEGATIVE 09/08/2016 1934   Sepsis Labs: @LABRCNTIP (procalcitonin:4,lacticidven:4) ) Recent Results (from the past 240 hour(s))  Culture, blood (routine x 2)     Status: None (Preliminary result)   Collection Time: 09/09/16 12:20 AM  Result Value Ref Range Status   Specimen Description BLOOD RIGHT HAND  Final   Special Requests   Final    BOTTLES DRAWN AEROBIC AND ANAEROBIC Blood Culture adequate volume   Culture NO GROWTH 2 DAYS  Final   Report Status PENDING  Incomplete  Culture, blood (routine x 2)     Status: None (Preliminary result)   Collection Time: 09/09/16 12:25 AM  Result Value Ref Range Status   Specimen Description BLOOD RIGHT WRIST  Final   Special Requests   Final    BOTTLES DRAWN AEROBIC AND ANAEROBIC Blood Culture adequate volume   Culture NO GROWTH 2 DAYS  Final   Report Status PENDING  Incomplete  Body fluid culture     Status: None (Preliminary result)   Collection Time: 09/09/16 12:49 PM  Result Value Ref Range Status   Specimen Description FLUID RIGHT HIP  Final   Special Requests Normal  Final   Gram Stain   Final    MODERATE WBC PRESENT, PREDOMINANTLY PMN MODERATE GRAM POSITIVE COCCI IN  PAIRS IN CLUSTERS    Culture MODERATE STAPHYLOCOCCUS AUREUS  Final   Report Status PENDING  Incomplete   Organism ID, Bacteria STAPHYLOCOCCUS AUREUS  Final      Susceptibility   Staphylococcus aureus - MIC*    CIPROFLOXACIN <=0.5 SENSITIVE Sensitive     ERYTHROMYCIN <=0.25 SENSITIVE Sensitive     GENTAMICIN <=0.5 SENSITIVE Sensitive     OXACILLIN <=0.25 SENSITIVE Sensitive     TETRACYCLINE <=1 SENSITIVE Sensitive     VANCOMYCIN <=0.5 SENSITIVE Sensitive     TRIMETH/SULFA <=10 SENSITIVE Sensitive     CLINDAMYCIN <=0.25 SENSITIVE Sensitive     RIFAMPIN <=0.5 SENSITIVE Sensitive     Inducible Clindamycin NEGATIVE Sensitive     * MODERATE STAPHYLOCOCCUS AUREUS  Aerobic/Anaerobic Culture (surgical/deep wound)     Status: None (Preliminary result)   Collection Time: 09/09/16  2:17 PM  Result Value Ref Range Status   Specimen Description ABSCESS RIGHT PELVIS  Final   Special Requests Normal  Final   Gram Stain   Final    ABUNDANT WBC PRESENT, PREDOMINANTLY PMN ABUNDANT GRAM POSITIVE COCCI IN CLUSTERS    Culture   Final    ABUNDANT STAPHYLOCOCCUS AUREUS NO ANAEROBES ISOLATED; CULTURE IN PROGRESS FOR 5 DAYS    Report Status PENDING  Incomplete   Organism ID, Bacteria STAPHYLOCOCCUS AUREUS  Final      Susceptibility   Staphylococcus aureus - MIC*    CIPROFLOXACIN <=0.5 SENSITIVE Sensitive     ERYTHROMYCIN <=0.25 SENSITIVE Sensitive     GENTAMICIN <=0.5 SENSITIVE Sensitive     OXACILLIN 0.5 SENSITIVE Sensitive     TETRACYCLINE <=1 SENSITIVE Sensitive     VANCOMYCIN <=0.5 SENSITIVE Sensitive     TRIMETH/SULFA <=10 SENSITIVE Sensitive     CLINDAMYCIN <=0.25 SENSITIVE Sensitive     RIFAMPIN <=0.5 SENSITIVE Sensitive     Inducible Clindamycin NEGATIVE Sensitive     * ABUNDANT STAPHYLOCOCCUS AUREUS         Radiology Studies: No results found.    Scheduled Meds: .  mouth rinse  15 mL Mouth Rinse BID  . metoprolol succinate  50 mg Oral Daily  . nicotine  14 mg Transdermal  Daily  . simvastatin  20 mg Oral Daily  . sodium chloride flush  5 mL Intravenous Q8H  . temazepam  30 mg Oral QHS   Continuous Infusions: . chlorproMAZINE (THORAZINE) IV Stopped (09/10/16 2222)  . methocarbamol (ROBAXIN)  IV    . piperacillin-tazobactam (ZOSYN)  IV 3.375 g (09/11/16 1429)  . vancomycin 1,000 mg (09/11/16 1430)     LOS: 2 days    Time spent in minutes: 35    Debbe Odea, MD [facility] Pager: [email or web address] Password [email or web address] 09/11/2016, 4:30 PM

## 2016-09-12 ENCOUNTER — Encounter (HOSPITAL_COMMUNITY): Payer: Self-pay | Admitting: Certified Registered Nurse Anesthetist

## 2016-09-12 ENCOUNTER — Inpatient Hospital Stay (HOSPITAL_COMMUNITY): Payer: BLUE CROSS/BLUE SHIELD | Admitting: Certified Registered Nurse Anesthetist

## 2016-09-12 ENCOUNTER — Encounter (HOSPITAL_COMMUNITY)
Admission: EM | Disposition: A | Discharge status: Home Health | Payer: Self-pay | Source: Home / Self Care | Attending: Internal Medicine

## 2016-09-12 HISTORY — PX: TOTAL HIP ARTHROPLASTY: SHX124

## 2016-09-12 LAB — BASIC METABOLIC PANEL
ANION GAP: 8 (ref 5–15)
BUN: 20 mg/dL (ref 6–20)
CALCIUM: 8.5 mg/dL — AB (ref 8.9–10.3)
CO2: 25 mmol/L (ref 22–32)
Chloride: 97 mmol/L — ABNORMAL LOW (ref 101–111)
Creatinine, Ser: 0.84 mg/dL (ref 0.61–1.24)
Glucose, Bld: 117 mg/dL — ABNORMAL HIGH (ref 65–99)
Potassium: 3.8 mmol/L (ref 3.5–5.1)
Sodium: 130 mmol/L — ABNORMAL LOW (ref 135–145)

## 2016-09-12 LAB — GLUCOSE, CAPILLARY
GLUCOSE-CAPILLARY: 97 mg/dL (ref 65–99)
Glucose-Capillary: 120 mg/dL — ABNORMAL HIGH (ref 65–99)
Glucose-Capillary: 140 mg/dL — ABNORMAL HIGH (ref 65–99)

## 2016-09-12 LAB — CBC
HEMATOCRIT: 37.2 % — AB (ref 39.0–52.0)
HEMOGLOBIN: 12.5 g/dL — AB (ref 13.0–17.0)
MCH: 32 pg (ref 26.0–34.0)
MCHC: 33.6 g/dL (ref 30.0–36.0)
MCV: 95.1 fL (ref 78.0–100.0)
Platelets: 361 10*3/uL (ref 150–400)
RBC: 3.91 MIL/uL — ABNORMAL LOW (ref 4.22–5.81)
RDW: 13 % (ref 11.5–15.5)
WBC: 11.9 10*3/uL — AB (ref 4.0–10.5)

## 2016-09-12 LAB — BODY FLUID CULTURE: SPECIAL REQUESTS: NORMAL

## 2016-09-12 LAB — SURGICAL PCR SCREEN
MRSA, PCR: NEGATIVE
STAPHYLOCOCCUS AUREUS: NEGATIVE

## 2016-09-12 SURGERY — ARTHROPLASTY, HIP, TOTAL,POSTERIOR APPROACH
Anesthesia: General | Laterality: Right

## 2016-09-12 MED ORDER — OXYCODONE HCL 5 MG PO TABS
5.0000 mg | ORAL_TABLET | ORAL | Status: DC | PRN
  Administered 2016-09-12 – 2016-09-13 (×2): 5 mg via ORAL
  Administered 2016-09-13: 10 mg via ORAL
  Administered 2016-09-14: 5 mg via ORAL
  Administered 2016-09-14 – 2016-09-15 (×6): 10 mg via ORAL
  Filled 2016-09-12: qty 2
  Filled 2016-09-12: qty 1
  Filled 2016-09-12 (×4): qty 2
  Filled 2016-09-12 (×2): qty 1
  Filled 2016-09-12 (×2): qty 2

## 2016-09-12 MED ORDER — METOCLOPRAMIDE HCL 5 MG/ML IJ SOLN: 5.0000 mg | Freq: Three times a day (TID) | INTRAMUSCULAR | Status: DC | PRN

## 2016-09-12 MED ORDER — LIDOCAINE 2% (20 MG/ML) 5 ML SYRINGE
INTRAMUSCULAR | Status: DC | PRN
  Administered 2016-09-12: 50 mg via INTRAVENOUS

## 2016-09-12 MED ORDER — CEFAZOLIN SODIUM-DEXTROSE 2-4 GM/100ML-% IV SOLN
2.0000 g | INTRAVENOUS | Status: AC
  Administered 2016-09-12: 2 g via INTRAVENOUS
  Filled 2016-09-12: qty 100

## 2016-09-12 MED ORDER — METOCLOPRAMIDE HCL 5 MG PO TABS: 5.0000 mg | ORAL_TABLET | Freq: Three times a day (TID) | ORAL | Status: DC | PRN

## 2016-09-12 MED ORDER — SODIUM CHLORIDE 0.9 % IR SOLN
Status: DC | PRN
  Administered 2016-09-12: 3000 mL

## 2016-09-12 MED ORDER — FENTANYL CITRATE (PF) 100 MCG/2ML IJ SOLN
INTRAMUSCULAR | Status: DC | PRN
  Administered 2016-09-12: 150 ug via INTRAVENOUS
  Administered 2016-09-12 (×2): 100 ug via INTRAVENOUS

## 2016-09-12 MED ORDER — MIDAZOLAM HCL 2 MG/2ML IJ SOLN
INTRAMUSCULAR | Status: AC
  Filled 2016-09-12: qty 2

## 2016-09-12 MED ORDER — MEPERIDINE HCL 25 MG/ML IJ SOLN
6.2500 mg | INTRAMUSCULAR | Status: DC | PRN
Start: 2016-09-12 — End: 2016-09-12

## 2016-09-12 MED ORDER — VANCOMYCIN HCL IN DEXTROSE 1-5 GM/200ML-% IV SOLN
1000.0000 mg | INTRAVENOUS | Status: DC
  Administered 2016-09-12: 1000 mg via INTRAVENOUS
  Filled 2016-09-12: qty 200

## 2016-09-12 MED ORDER — PROMETHAZINE HCL 25 MG/ML IJ SOLN: 6.2500 mg | INTRAMUSCULAR | Status: DC | PRN

## 2016-09-12 MED ORDER — VANCOMYCIN HCL 1000 MG IV SOLR
INTRAVENOUS | Status: AC
  Filled 2016-09-12: qty 1000

## 2016-09-12 MED ORDER — FERROUS SULFATE 325 (65 FE) MG PO TABS
325.0000 mg | ORAL_TABLET | Freq: Three times a day (TID) | ORAL | Status: DC
  Administered 2016-09-13 – 2016-09-15 (×9): 325 mg via ORAL
  Filled 2016-09-12 (×9): qty 1

## 2016-09-12 MED ORDER — MIDAZOLAM HCL 5 MG/5ML IJ SOLN
INTRAMUSCULAR | Status: DC | PRN
  Administered 2016-09-12: 2 mg via INTRAVENOUS

## 2016-09-12 MED ORDER — HYDROMORPHONE HCL 1 MG/ML IJ SOLN
INTRAMUSCULAR | Status: AC
  Filled 2016-09-12: qty 1

## 2016-09-12 MED ORDER — FENTANYL CITRATE (PF) 250 MCG/5ML IJ SOLN
INTRAMUSCULAR | Status: AC
  Filled 2016-09-12: qty 5

## 2016-09-12 MED ORDER — TOBRAMYCIN SULFATE 1.2 G IJ SOLR
INTRAMUSCULAR | Status: DC | PRN
  Administered 2016-09-12: 1.2 g

## 2016-09-12 MED ORDER — SODIUM CHLORIDE 0.9 % IV SOLN
INTRAVENOUS | Status: DC
  Administered 2016-09-12: 100 mL/h via INTRAVENOUS
  Administered 2016-09-12 – 2016-09-15 (×4): via INTRAVENOUS

## 2016-09-12 MED ORDER — FENTANYL CITRATE (PF) 100 MCG/2ML IJ SOLN
50.0000 ug | Freq: Once | INTRAMUSCULAR | Status: AC
  Administered 2016-09-12 (×2): 25 ug via INTRAVENOUS
  Administered 2016-09-12: 50 ug via INTRAVENOUS

## 2016-09-12 MED ORDER — ROCURONIUM BROMIDE 10 MG/ML (PF) SYRINGE
PREFILLED_SYRINGE | INTRAVENOUS | Status: DC | PRN
  Administered 2016-09-12: 50 mg via INTRAVENOUS

## 2016-09-12 MED ORDER — ONDANSETRON HCL 4 MG/2ML IJ SOLN
INTRAMUSCULAR | Status: AC
  Filled 2016-09-12: qty 2

## 2016-09-12 MED ORDER — MENTHOL 3 MG MT LOZG: 1.0000 | LOZENGE | OROMUCOSAL | Status: DC | PRN

## 2016-09-12 MED ORDER — PROPOFOL 10 MG/ML IV BOLUS
INTRAVENOUS | Status: AC
  Filled 2016-09-12: qty 20

## 2016-09-12 MED ORDER — ONDANSETRON HCL 4 MG/2ML IJ SOLN
INTRAMUSCULAR | Status: DC | PRN
  Administered 2016-09-12: 4 mg via INTRAVENOUS

## 2016-09-12 MED ORDER — CHLORHEXIDINE GLUCONATE 4 % EX LIQD: 60.0000 mL | Freq: Once | CUTANEOUS | Status: DC

## 2016-09-12 MED ORDER — MORPHINE SULFATE (PF) 2 MG/ML IV SOLN: 2.0000 mg | INTRAVENOUS | Status: DC | PRN

## 2016-09-12 MED ORDER — LACTATED RINGERS IV SOLN
INTRAVENOUS | Status: DC | PRN
  Administered 2016-09-12 (×2): via INTRAVENOUS

## 2016-09-12 MED ORDER — ROCURONIUM BROMIDE 10 MG/ML (PF) SYRINGE
PREFILLED_SYRINGE | INTRAVENOUS | Status: AC
  Filled 2016-09-12: qty 5

## 2016-09-12 MED ORDER — PROPOFOL 10 MG/ML IV BOLUS
INTRAVENOUS | Status: DC | PRN
  Administered 2016-09-12: 150 mg via INTRAVENOUS

## 2016-09-12 MED ORDER — PHENOL 1.4 % MT LIQD
1.0000 | OROMUCOSAL | Status: DC | PRN
Start: 2016-09-12 — End: 2016-09-15

## 2016-09-12 MED ORDER — PHENYLEPHRINE 40 MCG/ML (10ML) SYRINGE FOR IV PUSH (FOR BLOOD PRESSURE SUPPORT)
PREFILLED_SYRINGE | INTRAVENOUS | Status: AC
  Filled 2016-09-12: qty 10

## 2016-09-12 MED ORDER — MIDAZOLAM HCL 2 MG/2ML IJ SOLN: 0.5000 mg | Freq: Once | INTRAMUSCULAR | Status: DC | PRN

## 2016-09-12 MED ORDER — 0.9 % SODIUM CHLORIDE (POUR BTL) OPTIME
TOPICAL | Status: DC | PRN
  Administered 2016-09-12: 1000 mL

## 2016-09-12 MED ORDER — SUGAMMADEX SODIUM 200 MG/2ML IV SOLN
INTRAVENOUS | Status: DC | PRN
  Administered 2016-09-12: 164.2 mg via INTRAVENOUS

## 2016-09-12 MED ORDER — VANCOMYCIN HCL IN DEXTROSE 1-5 GM/200ML-% IV SOLN: 1000.0000 mg | INTRAVENOUS | Status: DC

## 2016-09-12 MED ORDER — HYDROMORPHONE HCL 1 MG/ML IJ SOLN
0.2500 mg | INTRAMUSCULAR | Status: DC | PRN
  Administered 2016-09-12 (×2): 0.5 mg via INTRAVENOUS

## 2016-09-12 MED ORDER — POVIDONE-IODINE 10 % EX SWAB: 2.0000 "application " | Freq: Once | CUTANEOUS | Status: DC

## 2016-09-12 MED ORDER — VANCOMYCIN HCL 1000 MG IV SOLR
INTRAVENOUS | Status: DC | PRN
  Administered 2016-09-12: 1000 mg

## 2016-09-12 MED ORDER — LIDOCAINE 2% (20 MG/ML) 5 ML SYRINGE
INTRAMUSCULAR | Status: AC
  Filled 2016-09-12: qty 5

## 2016-09-12 MED ORDER — MORPHINE SULFATE (PF) 2 MG/ML IV SOLN
2.0000 mg | INTRAVENOUS | Status: DC | PRN
  Administered 2016-09-12 – 2016-09-13 (×2): 2 mg via INTRAVENOUS
  Filled 2016-09-12 (×2): qty 1

## 2016-09-12 MED ORDER — TOBRAMYCIN SULFATE 1.2 G IJ SOLR
INTRAMUSCULAR | Status: AC
  Filled 2016-09-12: qty 1.2

## 2016-09-12 MED ORDER — SUGAMMADEX SODIUM 200 MG/2ML IV SOLN
INTRAVENOUS | Status: AC
  Filled 2016-09-12: qty 2

## 2016-09-12 MED ORDER — FENTANYL CITRATE (PF) 100 MCG/2ML IJ SOLN
INTRAMUSCULAR | Status: AC
  Administered 2016-09-12: 50 ug via INTRAVENOUS
  Filled 2016-09-12: qty 2

## 2016-09-12 SURGICAL SUPPLY — 76 items
ADH SKN CLS LQ APL DERMABOND (GAUZE/BANDAGES/DRESSINGS) ×1
APL SKNCLS STERI-STRIP NONHPOA (GAUZE/BANDAGES/DRESSINGS) ×1
BENZOIN TINCTURE PRP APPL 2/3 (GAUZE/BANDAGES/DRESSINGS) ×2 IMPLANT
BLADE SAW SAG 73X25 THK (BLADE) ×1
BLADE SAW SGTL 73X25 THK (BLADE) ×1 IMPLANT
BRUSH FEMORAL CANAL (MISCELLANEOUS) IMPLANT
CEMENT HV SMART SET (Cement) ×1 IMPLANT
COVER BACK TABLE 24X17X13 BIG (DRAPES) IMPLANT
DERMABOND ADHESIVE PROPEN (GAUZE/BANDAGES/DRESSINGS) ×1
DERMABOND ADVANCED .7 DNX6 (GAUZE/BANDAGES/DRESSINGS) IMPLANT
DRAPE IMP U-DRAPE 54X76 (DRAPES) ×2 IMPLANT
DRAPE INCISE IOBAN 66X45 STRL (DRAPES) IMPLANT
DRAPE INCISE IOBAN 85X60 (DRAPES) ×2 IMPLANT
DRAPE ORTHO SPLIT 77X108 STRL (DRAPES) ×4
DRAPE SURG ORHT 6 SPLT 77X108 (DRAPES) ×2 IMPLANT
DRAPE U-SHAPE 47X51 STRL (DRAPES) ×2 IMPLANT
DRILL BIT 7/64X5 (BIT) ×2 IMPLANT
DRSG MEPILEX BORDER 4X12 (GAUZE/BANDAGES/DRESSINGS) ×2 IMPLANT
DRSG MEPILEX BORDER 4X8 (GAUZE/BANDAGES/DRESSINGS) ×2 IMPLANT
DURAPREP 26ML APPLICATOR (WOUND CARE) ×2 IMPLANT
ELECT BLADE 4.0 EZ CLEAN MEGAD (MISCELLANEOUS) ×2
ELECT REM PT RETURN 9FT ADLT (ELECTROSURGICAL) ×2
ELECTRODE BLDE 4.0 EZ CLN MEGD (MISCELLANEOUS) ×1 IMPLANT
ELECTRODE REM PT RTRN 9FT ADLT (ELECTROSURGICAL) ×1 IMPLANT
EVACUATOR 1/8 PVC DRAIN (DRAIN) IMPLANT
FACESHIELD WRAPAROUND (MASK) ×4 IMPLANT
FACESHIELD WRAPAROUND OR TEAM (MASK) ×2 IMPLANT
FLOSEAL 10ML (HEMOSTASIS) ×2 IMPLANT
GLOVE BIOGEL PI IND STRL 7.5 (GLOVE) ×1 IMPLANT
GLOVE BIOGEL PI IND STRL 8 (GLOVE) ×2 IMPLANT
GLOVE BIOGEL PI INDICATOR 7.5 (GLOVE) ×1
GLOVE BIOGEL PI INDICATOR 8 (GLOVE) ×2
GLOVE ECLIPSE 8.0 STRL XLNG CF (GLOVE) ×2 IMPLANT
GLOVE ORTHO TXT STRL SZ7.5 (GLOVE) ×2 IMPLANT
GLOVE SURG ORTHO 8.0 STRL STRW (GLOVE) ×2 IMPLANT
GOWN STRL REIN 3XL XLG LVL4 (GOWN DISPOSABLE) ×2 IMPLANT
GOWN STRL REUS W/ TWL LRG LVL3 (GOWN DISPOSABLE) ×1 IMPLANT
GOWN STRL REUS W/TWL LRG LVL3 (GOWN DISPOSABLE) ×2
HANDPIECE INTERPULSE COAX TIP (DISPOSABLE)
HEAD FEM STD 32X+5 STRL (Hips) ×1 IMPLANT
IMMOBILIZER KNEE 20 (SOFTGOODS) IMPLANT
IMMOBILIZER KNEE 22 UNIV (SOFTGOODS) IMPLANT
IMMOBILIZER KNEE 24 THIGH 36 (MISCELLANEOUS) IMPLANT
IMMOBILIZER KNEE 24 UNIV (MISCELLANEOUS)
KIT BASIN OR (CUSTOM PROCEDURE TRAY) ×2 IMPLANT
KIT ROOM TURNOVER OR (KITS) ×2 IMPLANT
LINER ACET CUP 42MMX32MM (Hips) ×1 IMPLANT
MANIFOLD NEPTUNE II (INSTRUMENTS) ×2 IMPLANT
NS IRRIG 1000ML POUR BTL (IV SOLUTION) ×2 IMPLANT
PACK TOTAL JOINT (CUSTOM PROCEDURE TRAY) ×2 IMPLANT
PACK UNIVERSAL I (CUSTOM PROCEDURE TRAY) ×2 IMPLANT
PAD ARMBOARD 7.5X6 YLW CONV (MISCELLANEOUS) ×4 IMPLANT
PRESSURIZER FEMORAL UNIV (MISCELLANEOUS) IMPLANT
SET HNDPC FAN SPRY TIP SCT (DISPOSABLE) IMPLANT
SPONGE LAP 18X18 X RAY DECT (DISPOSABLE) ×2 IMPLANT
SPONGE LAP 4X18 X RAY DECT (DISPOSABLE) ×2 IMPLANT
STAPLER VISISTAT 35W (STAPLE) IMPLANT
STRIP CLOSURE SKIN 1/2X4 (GAUZE/BANDAGES/DRESSINGS) ×4 IMPLANT
SUCTION FRAZIER HANDLE 10FR (MISCELLANEOUS) ×1
SUCTION TUBE FRAZIER 10FR DISP (MISCELLANEOUS) ×1 IMPLANT
SUT ETHIBOND NAB CT1 #1 30IN (SUTURE) ×2 IMPLANT
SUT MNCRL AB 3-0 PS2 18 (SUTURE) ×2 IMPLANT
SUT PDS AB 1 CT  36 (SUTURE) ×2
SUT PDS AB 1 CT 36 (SUTURE) IMPLANT
SUT VIC AB 0 CT1 27 (SUTURE) ×4
SUT VIC AB 0 CT1 27XBRD ANBCTR (SUTURE) ×2 IMPLANT
SUT VIC AB 1 CT1 27 (SUTURE) ×4
SUT VIC AB 1 CT1 27XBRD ANBCTR (SUTURE) ×2 IMPLANT
SUT VIC AB 2-0 CT1 27 (SUTURE) ×2
SUT VIC AB 2-0 CT1 TAPERPNT 27 (SUTURE) ×1 IMPLANT
TOWEL OR 17X24 6PK STRL BLUE (TOWEL DISPOSABLE) ×2 IMPLANT
TOWEL OR 17X26 10 PK STRL BLUE (TOWEL DISPOSABLE) ×2 IMPLANT
TOWER CARTRIDGE SMART MIX (DISPOSABLE) IMPLANT
TRAY CATH 16FR W/PLASTIC CATH (SET/KITS/TRAYS/PACK) IMPLANT
TRAY FOLEY W/METER SILVER 16FR (SET/KITS/TRAYS/PACK) IMPLANT
WATER STERILE IRR 1000ML POUR (IV SOLUTION) ×6 IMPLANT

## 2016-09-12 NOTE — Progress Notes (Signed)
[facility] Infusion Coordinator will follow hospital course with ID team to support home IV ABX at DC.  [facility] will work with pt agency of choice for [facility].  If patient discharges after hours, please call [phone].   Larry Sierras 09/12/2016, 9:01 PM

## 2016-09-12 NOTE — Progress Notes (Signed)
C/o of back & right hip pain  10/10 After receiving IV pain medication 7/10

## 2016-09-12 NOTE — Progress Notes (Signed)
Orthopedic Tech Progress Note Patient Details:  Jon Choi 1950-07-22 [id number]  Ortho Devices Ortho Device/Splint Location: applied ohf to bed Ortho Device/Splint Interventions: Ordered, Application   Braulio Bosch 09/12/2016, 9:54 PM

## 2016-09-12 NOTE — Consult Note (Signed)
[facility] for Infectious Disease       Reason for Consult: psoas abscess and PJI    Referring Physician: Dr. Wynelle Cleveland  Principal Problem:   Sepsis [facility]) Active Problems:   HLD (hyperlipidemia)   Hypertension   Iliopsoas abscess ([place])   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-L5 level   ARF (acute renal failure) ([place])   . mouth rinse  15 mL Mouth Rinse BID  . metoprolol succinate  50 mg Oral Daily  . nicotine  14 mg Transdermal Daily  . simvastatin  20 mg Oral Daily  . sodium chloride flush  5 mL Intravenous Q8H  . temazepam  30 mg Oral QHS    Recommendations: Continue cefazolin for 6 weeks through September 10th picc line Can add rifampin  Assessment: He has a psoas abscess and pji with MSSA. Going for surgery today.    Routine HIV testing negative  Dr. Tommy Medal to follow up tomorrow  Antibiotics: cefazolin  HPI: Jon Choi is a 66 y.o. male with a history of a hip replacement in 20017 and back pain due to disc protrusion came in with worsening back pain.  He had fallen about 10 days ago and pain worsened since.  He does typically get injections in the back at the L4-5 region due to pain. CT scan here noted a loculated abscess and a drain was placed on 7/28 with culture positive for MSSA.  He has had some intermittent confusion suspected to be from pain medications.    Review of Systems:  Constitutional: negative for fevers and chills Gastrointestinal: negative for diarrhea Integument/breast: negative for rash All other systems reviewed and are negative    Past Medical History:  Diagnosis Date  . Back pain     Social History  Substance Use Topics  . Smoking status: Former Research scientist (life sciences)  . Smokeless tobacco: Never Used  . Alcohol use Yes    Family History  Problem Relation Age of Onset  . Hypertension Other     No Known Allergies  Physical Exam: Constitutional: in no apparent distress and alert  Vitals:   09/12/16 0821 09/12/16 1306  BP: (!) 142/81 (!) 125/56   Pulse: (!) 109 (!) 116  Resp: 20 16  Temp: 97.8 F (36.6 C) 98.6 F (37 C)   EYES: anicteric ENMT: no thrush Cardiovascular: Cor RRR Respiratory: CTA B; normal respiratory effort GI: Bowel sounds are normal, liver is not enlarged, spleen is not enlarged Musculoskeletal: no pedal edema noted Skin: negatives: no rash  Lab Results  Component Value Date   WBC 11.9 (H) 09/12/2016   HGB 12.5 (L) 09/12/2016   HCT 37.2 (L) 09/12/2016   MCV 95.1 09/12/2016   PLT 361 09/12/2016    Lab Results  Component Value Date   CREATININE 0.84 09/12/2016   BUN 20 09/12/2016   NA 130 (L) 09/12/2016   K 3.8 09/12/2016   CL 97 (L) 09/12/2016   CO2 25 09/12/2016    Lab Results  Component Value Date   ALT 31 09/09/2016   AST 41 09/09/2016   ALKPHOS 72 09/09/2016     Microbiology: Recent Results (from the past 240 hour(s))  Culture, blood (routine x 2)     Status: None (Preliminary result)   Collection Time: 09/09/16 12:20 AM  Result Value Ref Range Status   Specimen Description BLOOD RIGHT HAND  Final   Special Requests   Final    BOTTLES DRAWN AEROBIC AND ANAEROBIC Blood Culture adequate volume  Culture NO GROWTH 2 DAYS  Final   Report Status PENDING  Incomplete  Culture, blood (routine x 2)     Status: None (Preliminary result)   Collection Time: 09/09/16 12:25 AM  Result Value Ref Range Status   Specimen Description BLOOD RIGHT WRIST  Final   Special Requests   Final    BOTTLES DRAWN AEROBIC AND ANAEROBIC Blood Culture adequate volume   Culture NO GROWTH 2 DAYS  Final   Report Status PENDING  Incomplete  Body fluid culture     Status: None   Collection Time: 09/09/16 12:49 PM  Result Value Ref Range Status   Specimen Description FLUID RIGHT HIP  Final   Special Requests Normal  Final   Gram Stain   Final    MODERATE WBC PRESENT, PREDOMINANTLY PMN MODERATE GRAM POSITIVE COCCI IN PAIRS IN CLUSTERS    Culture MODERATE STAPHYLOCOCCUS AUREUS  Final   Report Status 09/12/2016  FINAL  Final   Organism ID, Bacteria STAPHYLOCOCCUS AUREUS  Final      Susceptibility   Staphylococcus aureus - MIC*    CIPROFLOXACIN <=0.5 SENSITIVE Sensitive     ERYTHROMYCIN <=0.25 SENSITIVE Sensitive     GENTAMICIN <=0.5 SENSITIVE Sensitive     OXACILLIN <=0.25 SENSITIVE Sensitive     TETRACYCLINE <=1 SENSITIVE Sensitive     VANCOMYCIN <=0.5 SENSITIVE Sensitive     TRIMETH/SULFA <=10 SENSITIVE Sensitive     CLINDAMYCIN <=0.25 SENSITIVE Sensitive     RIFAMPIN <=0.5 SENSITIVE Sensitive     Inducible Clindamycin NEGATIVE Sensitive     * MODERATE STAPHYLOCOCCUS AUREUS  Aerobic/Anaerobic Culture (surgical/deep wound)     Status: None (Preliminary result)   Collection Time: 09/09/16  2:17 PM  Result Value Ref Range Status   Specimen Description ABSCESS RIGHT PELVIS  Final   Special Requests Normal  Final   Gram Stain   Final    ABUNDANT WBC PRESENT, PREDOMINANTLY PMN ABUNDANT GRAM POSITIVE COCCI IN CLUSTERS    Culture   Final    ABUNDANT STAPHYLOCOCCUS AUREUS NO ANAEROBES ISOLATED; CULTURE IN PROGRESS FOR 5 DAYS    Report Status PENDING  Incomplete   Organism ID, Bacteria STAPHYLOCOCCUS AUREUS  Final      Susceptibility   Staphylococcus aureus - MIC*    CIPROFLOXACIN <=0.5 SENSITIVE Sensitive     ERYTHROMYCIN <=0.25 SENSITIVE Sensitive     GENTAMICIN <=0.5 SENSITIVE Sensitive     OXACILLIN 0.5 SENSITIVE Sensitive     TETRACYCLINE <=1 SENSITIVE Sensitive     VANCOMYCIN <=0.5 SENSITIVE Sensitive     TRIMETH/SULFA <=10 SENSITIVE Sensitive     CLINDAMYCIN <=0.25 SENSITIVE Sensitive     RIFAMPIN <=0.5 SENSITIVE Sensitive     Inducible Clindamycin NEGATIVE Sensitive     * ABUNDANT STAPHYLOCOCCUS AUREUS    COMER, ROBERT, [place] for Infectious Disease [facility] [email or web address] [phone] pager  [phone] 09/12/2016, 2:46 PM

## 2016-09-12 NOTE — Anesthesia Procedure Notes (Signed)
Procedure Name: Intubation Date/Time: 09/12/2016 5:49 PM Performed by: Eligha Bridegroom Pre-anesthesia Checklist: Patient identified, Emergency Drugs available, Suction available, Patient being monitored and Timeout performed Patient Re-evaluated:Patient Re-evaluated prior to induction Oxygen Delivery Method: Circle system utilized Preoxygenation: Pre-oxygenation with 100% oxygen Induction Type: IV induction Ventilation: Mask ventilation without difficulty Laryngoscope Size: Mac and 4 Grade View: Grade I Tube type: Oral Tube size: 7.5 mm Number of attempts: 1 Airway Equipment and Method: Stylet Placement Confirmation: ETT inserted through vocal cords under direct vision,  positive ETCO2 and breath sounds checked- equal and bilateral Secured at: 22 cm Tube secured with: Tape Dental Injury: Teeth and Oropharynx as per pre-operative assessment

## 2016-09-12 NOTE — Anesthesia Preprocedure Evaluation (Signed)
Anesthesia Evaluation  Patient identified by MRN, date of birth, ID band Patient awake    Reviewed: Allergy & Precautions, NPO status , Patient's Chart, lab work & pertinent test results  History of Anesthesia Complications Negative for: history of anesthetic complications  Airway Mallampati: II  TM Distance: >3 FB Neck ROM: Full    Dental  (+) Dental Advisory Given, Caps   Pulmonary Current Smoker,    breath sounds clear to auscultation       Cardiovascular hypertension, Pt. on medications and Pt. on home beta blockers  Rhythm:Regular Rate:Normal     Neuro/Psych Spinal stenosis    GI/Hepatic negative GI ROS, Neg liver ROS,   Endo/Other  negative endocrine ROS  Renal/GU negative Renal ROS     Musculoskeletal   Abdominal   Peds  Hematology negative hematology ROS (+)   Anesthesia Other Findings   Reproductive/Obstetrics                             Anesthesia Physical Anesthesia Plan  ASA: II  Anesthesia Plan: General   Post-op Pain Management:    Induction: Intravenous  PONV Risk Score and Plan: 3 and Ondansetron, Dexamethasone and Midazolam  Airway Management Planned: Oral ETT  Additional Equipment:   Intra-op Plan:   Post-operative Plan: Extubation in OR  Informed Consent: I have reviewed the patients History and Physical, chart, labs and discussed the procedure including the risks, benefits and alternatives for the proposed anesthesia with the patient or authorized representative who has indicated his/her understanding and acceptance.   Dental advisory given  Plan Discussed with: CRNA and Surgeon  Anesthesia Plan Comments: (Plan routine monitors, GETA)        Anesthesia Quick Evaluation

## 2016-09-12 NOTE — Consult Note (Signed)
[facility]    Reason for Consult: Management of iliopsoas abscess Referring Physician: Dr. 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 mouth rinse  15 mL Mouth Rinse BID  . metoprolol succinate  50 mg Oral Daily  . nicotine  14 mg Transdermal Daily  . simvastatin  20 mg Oral Daily  . sodium chloride flush  5 mL Intravenous Q8H  . temazepam  30 mg Oral QHS    Recommendations:  - Continue IV cefazolin, consider increasing dose to 2g q8h prior to discharge as the patient has demonstrated improvement in renal function. The patient will need IV therapy for 6 weeks followed by oral antibiotics to treat prosthetic joint infection.  - Place PICC line before discharge.   - Obtain CRP, ESR - Obtain TTE  Assessment: This 66 y.o. male with a history of HTN, HLD, chronic back pain, and osteoarthritis s/p total hip replacement (10/2005) presents with difficulty ambulating and recent fall. He was found to have a R psoas abscess with aspirate cultures growing MSSA x2. Blood cultures were negative x2. Per Ortho and Radiology, abscess is anterior to hip joint and the hip joint itself does not appear to be infected, but remains a possibility. The patient has been tachycardic but afebrile and otherwise hemodynamically stable after transition from broad spectrum antibiotics to IV cefazolin. AKI on presentation is likely prerenal and has resolved with IVF resuscitation. The patient will need 6 weeks of IV antibiotics to treat prosthetic joint infection, with accomodation being made for upcoming procedures. Patient will then follow with PO antibiotics. Will need baseline CRP and ESR to monitor therapy. There is low suspicion for endocarditis with negative blood cultures, but can consider obtaining TTE.   Antibiotics: - IV  vancomycin, zosyn (7/28-7/30) - IV cefazolin (7/30-)  HPI: Jon Choi is a 66 y.o. 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 History is limited due to patient confusion and limited recall. He states that he suffered a fall while walking his dog on 7/21 resulted in worsened back pain from baseline. He reports otherwise being in his normal state of health before presenting, denying fevers, chills, chest pain, shortness of breath, diarrhea, nausea/vomiting, abdominal pain, dysuria, and hematuria.   Patient had R total hip arthroplasty (09/2005) in the setting of severe osteoarthritis and failure of conservative management. He denies having any complications related to prosthesis since the surgery.  He has been receiving epidural steroid injections to treat his back pain, with last administration on 7/17. Injections initially were effective, however the patient states that injections have not been providing much relief recently. He was evaluated by orthopedic surgeon on 7/18, who at that time recommended neurosurgery evaluation due to L4-L5 distribution numbness and patient desire for surgical referral for persisting pain.   CT pelvis (7/27) showed 2.7x4 cm R iliacus loculated abscess, anterior to R hip arthroplasty with inflammatory infiltration into muscle. Patient had abscess aspirated and drain was placed on 7/28, with current plan for I&D today. Aspirate cultures grew pan-sensitive GPCs in clusters x2, blood cultures negative x2.   He presented with temp to 100.3, WBC of 14.4, and tachycardic to 110s. He received  fluids and was started on vancomycin and zosyn (7/28-7/30) with transition to cefazolin (7/30-). The patient has had no recent fevers but has had persistent tachycardia.    Review of Systems:  Constitutional: positive for fatigue, negative  for sweats, changes in appetite, chills Respiratory: negative for cough or dyspnea on exertion  Cardiovascular: negative for chest pressure/discomfort and chest pain Gastrointestinal: negative for change in bowel habits and constipation and diarrhea Genitourinary: negative for dysuria, hematuria, change in urinary frequency or color Musculoskeletal: negative for myalgias, +back pain Neurological: negative for headaches and dizziness All other systems reviewed and are negative    Past Medical History:  Diagnosis Date  . Back pain     Social History  Substance Use Topics  . Smoking status: Former Research scientist (life sciences)  . Smokeless tobacco: Never Used  . Alcohol use Yes    Family History  Problem Relation Age of Onset  . Hypertension Other     No Known Allergies  Physical Exam: Constitutional: in no apparent distress, lying in bed, demonstrates confusion but is able to answer questions and follow commands Vitals:   09/12/16 0447 09/12/16 0821  BP: (!) 158/80 (!) 142/81  Pulse: (!) 118 (!) 109  Resp: 18 20  Temp: 98.5 F (36.9 C) 97.8 F (36.6 C)   EYES: anicteric, EOMI ENMT: moist mucous membranes, no oral lesions or erythema Cardiovascular: tachycardic, normal S1 and S2, no murmur Respiratory: clear; normal work of breathing GI: soft, non-distended, bowel sounds are normal Musculoskeletal: no peripheral edema Skin: no rashes or lesions Neuro/Psych: appropriate mood and affect, alert, oriented to person, place, year  Lab Results  Component Value Date   WBC 11.9 (H) 09/12/2016   HGB 12.5 (L) 09/12/2016   HCT 37.2 (L) 09/12/2016   MCV 95.1 09/12/2016   PLT 361 09/12/2016    Lab Results  Component Value Date   CREATININE 0.84 09/12/2016   BUN 20 09/12/2016   NA 130 (L) 09/12/2016   K 3.8 09/12/2016   CL 97 (L) 09/12/2016   CO2 25 09/12/2016    Lab Results  Component Value Date   ALT 31 09/09/2016   AST 41 09/09/2016   ALKPHOS 72 09/09/2016     Microbiology: Recent  Results (from the past 240 hour(s))  Culture, blood (routine x 2)     Status: None (Preliminary result)   Collection Time: 09/09/16 12:20 AM  Result Value Ref Range Status   Specimen Description BLOOD RIGHT HAND  Final   Special Requests   Final    BOTTLES DRAWN AEROBIC AND ANAEROBIC Blood Culture adequate volume   Culture NO GROWTH 2 DAYS  Final   Report Status PENDING  Incomplete  Culture, blood (routine x 2)     Status: None (Preliminary result)   Collection Time: 09/09/16 12:25 AM  Result Value Ref Range Status   Specimen Description BLOOD RIGHT WRIST  Final   Special Requests   Final    BOTTLES DRAWN AEROBIC AND ANAEROBIC Blood Culture adequate volume   Culture NO GROWTH 2 DAYS  Final   Report Status PENDING  Incomplete  Body fluid culture     Status: None   Collection Time: 09/09/16 12:49 PM  Result Value Ref Range Status   Specimen Description FLUID RIGHT HIP  Final   Special Requests Normal  Final   Gram Stain   Final    MODERATE WBC PRESENT, PREDOMINANTLY PMN MODERATE GRAM POSITIVE COCCI IN PAIRS IN CLUSTERS    Culture MODERATE STAPHYLOCOCCUS AUREUS  Final   Report Status 09/12/2016 FINAL  Final   Organism ID, Bacteria STAPHYLOCOCCUS AUREUS  Final      Susceptibility   Staphylococcus aureus - MIC*    CIPROFLOXACIN <=0.5 SENSITIVE Sensitive     ERYTHROMYCIN <=0.25 SENSITIVE Sensitive     GENTAMICIN <=0.5 SENSITIVE Sensitive     OXACILLIN <=0.25 SENSITIVE Sensitive     TETRACYCLINE <=1 SENSITIVE Sensitive     VANCOMYCIN <=0.5 SENSITIVE Sensitive     TRIMETH/SULFA <=10 SENSITIVE Sensitive     CLINDAMYCIN <=0.25 SENSITIVE Sensitive     RIFAMPIN <=0.5 SENSITIVE Sensitive     Inducible Clindamycin NEGATIVE Sensitive     * MODERATE STAPHYLOCOCCUS AUREUS  Aerobic/Anaerobic Culture (surgical/deep wound)     Status: None (Preliminary result)   Collection Time: 09/09/16  2:17 PM  Result Value Ref Range Status   Specimen Description ABSCESS RIGHT PELVIS  Final   Special  Requests Normal  Final   Gram Stain   Final    ABUNDANT WBC PRESENT, PREDOMINANTLY PMN ABUNDANT GRAM POSITIVE COCCI IN CLUSTERS    Culture   Final    ABUNDANT STAPHYLOCOCCUS AUREUS NO ANAEROBES ISOLATED; CULTURE IN PROGRESS FOR 5 DAYS    Report Status PENDING  Incomplete   Organism ID, Bacteria STAPHYLOCOCCUS AUREUS  Final      Susceptibility   Staphylococcus aureus - MIC*    CIPROFLOXACIN <=0.5 SENSITIVE Sensitive     ERYTHROMYCIN <=0.25 SENSITIVE Sensitive     GENTAMICIN <=0.5 SENSITIVE Sensitive     OXACILLIN 0.5 SENSITIVE Sensitive     TETRACYCLINE <=1 SENSITIVE Sensitive     VANCOMYCIN <=0.5 SENSITIVE Sensitive     TRIMETH/SULFA <=10 SENSITIVE Sensitive     CLINDAMYCIN <=0.25 SENSITIVE Sensitive     RIFAMPIN <=0.5 SENSITIVE Sensitive     Inducible Clindamycin NEGATIVE Sensitive     * ABUNDANT STAPHYLOCOCCUS AUREUS    Will Snyder Colavito, MS4 09/12/2016, 12:27 PM

## 2016-09-12 NOTE — Progress Notes (Signed)
PROGRESS NOTE    Jon Choi   NID:[id number]  DOB: August 03, 1950  DOA: 09/08/2016 PCP: [name], MD   Brief Narrative:  Jon Choi 66 y.o. male with history of hypertension, hyperlipidemia, melanoma, right hip replacement, chronic back pain  with L4-L5 disc protrusion and spondylolisthesis  was brought to the ER for increasing back pain with difficulty ambulating. Imaging reveals a right psoas abscess and severe stenosis of L4-L5 with compression fx at L5.  7/17- received epidural steroid injection by Dr Ernestina Patches (pain management) 7/18- per note by Dr Ninfa Linden, needs referral to Dr Saintclair Halsted, [place] for surgical eval  Underwent lavage and then aspiration of right hip and Perc drain of right psoas   Subjective: He tells me that he is having pain this AM in lower back. Seems much more alert and oriented today. Has hiccoughs again.  ROS: no complaints of nausea, vomiting, constipation, diarrhea, cough, dyspnea or dysuria. No other complaints.   Assessment & Plan:   Principal Problem:   Iliopsoas abscess- MSSA- right Hip aspirate showing the same    Sepsis - temp 100.3, WBC 14, HR in 100s - lactic acid normal at 1.48 - IR guided drainage - drain still present -  Psoas abscess culture and right hip lavage culture showing MSSA - d/c  Vanc and Zosyn- started Ancef - blood cultures negative - ortho following for right prosthetic hip infection- will go to OR today - have asked for ID for opinion on duration of anitbiotics  Active Problems: Acute encephalopathy - unfortunately is likely due to narcotics- wife noted it at home in relation to Hydrocodone - 7/29 d/c'd Fentanyl and Valium -  7/30- no complaints of pain and quite confused-  changed Hydrocodone to Oxycodone 5-10 mg and d/c'd Morphine with plan to continue to wean narcotics-  today is is no longer confused but states he is in significant pain. Will resume Morphine which will likely make him confused again.  - continue to  wean Narcotics as able    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-L5 level - neurosurgery notes that he is current not a candidate for surgery with an ongoing infectious process     ARF (acute renal failure)  - due to sepsis, dehydration - has improved  Hyponatremia  - dehydration improved with IVF - IVF on hold now- good urine output  Hypokalemia - replace  Hiccoughs - Thorazine PRN  Hypertension - Metoprolol    HLD (hyperlipidemia) - Zocor  Nicotine abuse - per wife, he "vapes" - Nicotine patch   DVT prophylaxis: SCDs Code Status: Full code Family Communication:  Disposition Plan:  Consultants:   Neurosurgery  IR Procedures:   CT guided lavage and aspiration of hip and Perc drain of psoas abscess Antimicrobials:  Anti-infectives    Start     Dose/Rate Route Frequency Ordered Stop   09/11/16 1800  ceFAZolin (ANCEF) IVPB 1 g/50 mL premix     1 g 100 mL/hr over 30 Minutes Intravenous Every 8 hours 09/11/16 1631     09/09/16 1000  vancomycin (VANCOCIN) IVPB 750 mg/150 ml premix  Status:  Discontinued     750 mg 150 mL/hr over 60 Minutes Intravenous Every 12 hours 09/09/16 0108 09/09/16 0853   09/09/16 1000  vancomycin (VANCOCIN) IVPB 1000 mg/200 mL premix  Status:  Discontinued     1,000 mg 200 mL/hr over 60 Minutes Intravenous Every 12 hours 09/09/16 0853 09/11/16 1631   09/09/16 0400  piperacillin-tazobactam (ZOSYN) IVPB 3.375 g  Status:  Discontinued     3.375 g 12.5 mL/hr over 240 Minutes Intravenous Every 8 hours 09/09/16 0108 09/11/16 1631   09/08/16 2115  piperacillin-tazobactam (ZOSYN) IVPB 3.375 g     3.375 g 100 mL/hr over 30 Minutes Intravenous  Once 09/08/16 2107 09/08/16 2218   09/08/16 2115  vancomycin (VANCOCIN) IVPB 1000 mg/200 mL premix     1,000 mg 200 mL/hr over 60 Minutes Intravenous  Once 09/08/16 2107 09/08/16 2320       Objective: Vitals:   09/11/16 2104 09/12/16 0139 09/12/16 0447 09/12/16 0821  BP: (!) 148/78 (!) 146/62 (!) 158/80 (!) 142/81    Pulse: (!) 111 (!) 115 (!) 118 (!) 109  Resp: 18 18 18 20   Temp: 97.9 F (36.6 C) 98 F (36.7 C) 98.5 F (36.9 C) 97.8 F (36.6 C)  TempSrc: Oral Oral Oral Oral  SpO2: 99% 98% 96% 97%  Weight:      Height:        Intake/Output Summary (Last 24 hours) at 09/12/16 1232 Last data filed at 09/12/16 0636  Gross per 24 hour  Intake              545 ml  Output              420 ml  Net              125 ml   Filed Weights   09/08/16 1527 09/09/16 0130  Weight: 76.2 kg (168 lb) 82.1 kg (180 lb 14.4 oz)      Examination: General exam: Appears comfortable  HEENT: PERRLA, oral mucosa moist, no sclera icterus or thrush Respiratory system: Clear to auscultation. Respiratory effort normal. Cardiovascular system: S1 & S2 heard, RRR.  No murmurs  Gastrointestinal system: Abdomen soft, non-tender, nondistended. Normal bowel sound. No organomegaly Central nervous system: Alert and oriented. No focal neurological deficits. Extremities: No cyanosis, clubbing or edema Musculoskeletal- drain on left flank- mild amount of yellow drainage Skin: No rashes or ulcers Psychiatry:  Mood & affect appropriate.     Data Reviewed: I have personally reviewed following labs and imaging studies  CBC:  Recent Labs Lab 09/08/16 1926 09/09/16 0359 09/10/16 0326 09/11/16 0334 09/12/16 0302  WBC 14.7* 14.4* 13.0* 12.4* 11.9*  NEUTROABS 13.0* 12.9*  --   --   --   HGB 12.7* 11.9* 11.4* 12.0* 12.5*  HCT 37.3* 36.0* 34.5* 36.6* 37.2*  MCV 94.2 94.7 95.3 97.3 95.1  PLT 219 235 272 325 338   Basic Metabolic Panel:  Recent Labs Lab 09/08/16 1926 09/09/16 0359 09/10/16 0326 09/11/16 0334 09/12/16 0302  NA 130* 133* 134* 136 130*  K 3.9 3.7 3.7 3.3* 3.8  CL 94* 97* 99* 97* 97*  CO2 24 25 27 30 25   GLUCOSE 140* 114* 148* 120* 117*  BUN 39* 39* 26* 22* 20  CREATININE 1.63* 1.19 0.91 0.92 0.84  CALCIUM 9.5 8.9 8.6* 8.7* 8.5*   GFR: Estimated Creatinine Clearance: 101.7 mL/min (by C-G formula  based on SCr of 0.84 mg/dL). Liver Function Tests:  Recent Labs Lab 09/08/16 1926 09/09/16 0359  AST 47* 41  ALT 35 31  ALKPHOS 73 72  BILITOT 1.2 1.0  PROT 6.9 6.6  ALBUMIN 2.7* 2.3*   No results for input(s): LIPASE, AMYLASE in the last 168 hours. No results for input(s): AMMONIA in the last 168 hours. Coagulation Profile: No results for input(s): INR, PROTIME in the last 168 hours. Cardiac Enzymes: No results for input(s): CKTOTAL, CKMB,  CKMBINDEX, TROPONINI in the last 168 hours. BNP (last 3 results) No results for input(s): PROBNP in the last 8760 hours. HbA1C: No results for input(s): HGBA1C in the last 72 hours. CBG:  Recent Labs Lab 09/11/16 0114 09/11/16 0118 09/11/16 1631 09/11/16 2103 09/12/16 0730  GLUCAP 133* 139* 117* 96 120*   Lipid Profile: No results for input(s): CHOL, HDL, LDLCALC, TRIG, CHOLHDL, LDLDIRECT in the last 72 hours. Thyroid Function Tests: No results for input(s): TSH, T4TOTAL, FREET4, T3FREE, THYROIDAB in the last 72 hours. Anemia Panel: No results for input(s): VITAMINB12, FOLATE, FERRITIN, TIBC, IRON, RETICCTPCT in the last 72 hours. Urine analysis:    Component Value Date/Time   COLORURINE AMBER (A) 09/08/2016 1934   APPEARANCEUR HAZY (A) 09/08/2016 1934   LABSPEC 1.025 09/08/2016 1934   PHURINE 5.0 09/08/2016 1934   GLUCOSEU NEGATIVE 09/08/2016 1934   HGBUR MODERATE (A) 09/08/2016 1934   BILIRUBINUR NEGATIVE 09/08/2016 1934   KETONESUR NEGATIVE 09/08/2016 1934   PROTEINUR 100 (A) 09/08/2016 1934   UROBILINOGEN 0.2 05/25/2007 1944   NITRITE NEGATIVE 09/08/2016 1934   LEUKOCYTESUR NEGATIVE 09/08/2016 1934   Sepsis Labs: @LABRCNTIP (procalcitonin:4,lacticidven:4) ) Recent Results (from the past 240 hour(s))  Culture, blood (routine x 2)     Status: None (Preliminary result)   Collection Time: 09/09/16 12:20 AM  Result Value Ref Range Status   Specimen Description BLOOD RIGHT HAND  Final   Special Requests   Final     BOTTLES DRAWN AEROBIC AND ANAEROBIC Blood Culture adequate volume   Culture NO GROWTH 2 DAYS  Final   Report Status PENDING  Incomplete  Culture, blood (routine x 2)     Status: None (Preliminary result)   Collection Time: 09/09/16 12:25 AM  Result Value Ref Range Status   Specimen Description BLOOD RIGHT WRIST  Final   Special Requests   Final    BOTTLES DRAWN AEROBIC AND ANAEROBIC Blood Culture adequate volume   Culture NO GROWTH 2 DAYS  Final   Report Status PENDING  Incomplete  Body fluid culture     Status: None   Collection Time: 09/09/16 12:49 PM  Result Value Ref Range Status   Specimen Description FLUID RIGHT HIP  Final   Special Requests Normal  Final   Gram Stain   Final    MODERATE WBC PRESENT, PREDOMINANTLY PMN MODERATE GRAM POSITIVE COCCI IN PAIRS IN CLUSTERS    Culture MODERATE STAPHYLOCOCCUS AUREUS  Final   Report Status 09/12/2016 FINAL  Final   Organism ID, Bacteria STAPHYLOCOCCUS AUREUS  Final      Susceptibility   Staphylococcus aureus - MIC*    CIPROFLOXACIN <=0.5 SENSITIVE Sensitive     ERYTHROMYCIN <=0.25 SENSITIVE Sensitive     GENTAMICIN <=0.5 SENSITIVE Sensitive     OXACILLIN <=0.25 SENSITIVE Sensitive     TETRACYCLINE <=1 SENSITIVE Sensitive     VANCOMYCIN <=0.5 SENSITIVE Sensitive     TRIMETH/SULFA <=10 SENSITIVE Sensitive     CLINDAMYCIN <=0.25 SENSITIVE Sensitive     RIFAMPIN <=0.5 SENSITIVE Sensitive     Inducible Clindamycin NEGATIVE Sensitive     * MODERATE STAPHYLOCOCCUS AUREUS  Aerobic/Anaerobic Culture (surgical/deep wound)     Status: None (Preliminary result)   Collection Time: 09/09/16  2:17 PM  Result Value Ref Range Status   Specimen Description ABSCESS RIGHT PELVIS  Final   Special Requests Normal  Final   Gram Stain   Final    ABUNDANT WBC PRESENT, PREDOMINANTLY PMN ABUNDANT GRAM POSITIVE COCCI IN CLUSTERS  Culture   Final    ABUNDANT STAPHYLOCOCCUS AUREUS NO ANAEROBES ISOLATED; CULTURE IN PROGRESS FOR 5 DAYS    Report  Status PENDING  Incomplete   Organism ID, Bacteria STAPHYLOCOCCUS AUREUS  Final      Susceptibility   Staphylococcus aureus - MIC*    CIPROFLOXACIN <=0.5 SENSITIVE Sensitive     ERYTHROMYCIN <=0.25 SENSITIVE Sensitive     GENTAMICIN <=0.5 SENSITIVE Sensitive     OXACILLIN 0.5 SENSITIVE Sensitive     TETRACYCLINE <=1 SENSITIVE Sensitive     VANCOMYCIN <=0.5 SENSITIVE Sensitive     TRIMETH/SULFA <=10 SENSITIVE Sensitive     CLINDAMYCIN <=0.25 SENSITIVE Sensitive     RIFAMPIN <=0.5 SENSITIVE Sensitive     Inducible Clindamycin NEGATIVE Sensitive     * ABUNDANT STAPHYLOCOCCUS AUREUS         Radiology Studies: Dg Hip Unilat With Pelvis 2-3 Views Right  Result Date: 09/11/2016 CLINICAL DATA:  Right hip pain.  No known injury. EXAM: DG HIP (WITH OR WITHOUT PELVIS) 2-3V RIGHT COMPARISON:  CT pelvis for abscess drain catheter placement 09/09/2016. CT pelvis 09/08/2016 FINDINGS: Postoperative left hip hemiarthroplasty with non cemented components. Components appear well seated. No evidence of acute fracture or dislocation in the pelvis or right hip. Pigtail drainage catheter projected over the right pelvis. SI joints and symphysis pubis are not displaced. Left hip demonstrates degenerative changes. IMPRESSION: Right hip hemiarthroplasty appears well seated. No acute fracture or dislocation. Drainage catheter projected over the right pelvis. Electronically Signed   By: Lucienne Capers M.D.   On: 09/11/2016 23:50      Scheduled Meds: . mouth rinse  15 mL Mouth Rinse BID  . metoprolol succinate  50 mg Oral Daily  . nicotine  14 mg Transdermal Daily  . simvastatin  20 mg Oral Daily  . sodium chloride flush  5 mL Intravenous Q8H  . temazepam  30 mg Oral QHS   Continuous Infusions: .  ceFAZolin (ANCEF) IV Stopped (09/12/16 0936)  . chlorproMAZINE (THORAZINE) IV 25 mg (09/12/16 1053)  . methocarbamol (ROBAXIN)  IV       LOS: 3 days    Time spent in minutes: 35    Debbe Odea,  MD [facility] Pager: [email or web address] Password [facility] 09/12/2016, 12:36 PM

## 2016-09-12 NOTE — Anesthesia Postprocedure Evaluation (Signed)
Anesthesia Post Note  Patient: Jon Choi  Procedure(s) Performed: Procedure(s) (LRB): IRRIGATION AND DEBRIDEMENT HIP WITH FEMORAL HEAD AND ACETABULAR CUP EXCHANGE (Right)     Patient location during evaluation: PACU Anesthesia Type: General Level of consciousness: awake and alert, patient cooperative and oriented Pain management: pain level controlled Vital Signs Assessment: post-procedure vital signs reviewed and stable Respiratory status: spontaneous breathing, nonlabored ventilation, respiratory function stable and patient connected to nasal cannula oxygen Cardiovascular status: blood pressure returned to baseline and stable Postop Assessment: no signs of nausea or vomiting Anesthetic complications: no Comments: Pt understands lost part of molar on extubation    Last Vitals:  Vitals:   09/12/16 2020 09/12/16 2023  BP: 102/80 (!) 146/85  Pulse: 86 92  Resp: 17 15  Temp:  36.7 C    Last Pain:  Vitals:   09/12/16 2023  TempSrc:   PainSc: Asleep    LLE Motor Response: Purposeful movement (09/12/16 2023) LLE Sensation: Full sensation (09/12/16 2023) RLE Motor Response: Purposeful movement (09/12/16 2023) RLE Sensation: Full sensation (09/12/16 2023)      Seleta Rhymes. Fidencio Duddy

## 2016-09-12 NOTE — Progress Notes (Signed)
Report received from Rocky Morel., RN from [facility]M.

## 2016-09-12 NOTE — Progress Notes (Addendum)
Subjective: Patient is still confused. He is oriented to the year only.   Objective: Vital signs in last 24 hours: Temp:  [97.5 F (36.4 C)-98.5 F (36.9 C)] 97.8 F (36.6 C) (07/31 0821) Pulse Rate:  [100-118] 109 (07/31 0821) Resp:  [18-20] 20 (07/31 0821) BP: (106-158)/(60-85) 142/81 (07/31 0821) SpO2:  [96 %-99 %] 97 % (07/31 0821)  Intake/Output from previous day: 07/30 0701 - 07/31 0700 In: 550 [P.O.:240; IV Piggyback:300] Out: 420 [Urine:360; Drains:60] Intake/Output this shift: No intake/output data recorded.  Neurologic: Alert and oriented X 1, decreased strength in BLE.   Lab Results: Lab Results  Component Value Date   WBC 11.9 (H) 09/12/2016   HGB 12.5 (L) 09/12/2016   HCT 37.2 (L) 09/12/2016   MCV 95.1 09/12/2016   PLT 361 09/12/2016   No results found for: INR, PROTIME BMET Lab Results  Component Value Date   NA 130 (L) 09/12/2016   K 3.8 09/12/2016   CL 97 (L) 09/12/2016   CO2 25 09/12/2016   GLUCOSE 117 (H) 09/12/2016   BUN 20 09/12/2016   CREATININE 0.84 09/12/2016   CALCIUM 8.5 (L) 09/12/2016    Studies/Results: Dg Hip Unilat With Pelvis 2-3 Views Right  Result Date: 09/11/2016 CLINICAL DATA:  Right hip pain.  No known injury. EXAM: DG HIP (WITH OR WITHOUT PELVIS) 2-3V RIGHT COMPARISON:  CT pelvis for abscess drain catheter placement 09/09/2016. CT pelvis 09/08/2016 FINDINGS: Postoperative left hip hemiarthroplasty with non cemented components. Components appear well seated. No evidence of acute fracture or dislocation in the pelvis or right hip. Pigtail drainage catheter projected over the right pelvis. SI joints and symphysis pubis are not displaced. Left hip demonstrates degenerative changes. IMPRESSION: Right hip hemiarthroplasty appears well seated. No acute fracture or dislocation. Drainage catheter projected over the right pelvis. Electronically Signed   By: Lucienne Capers M.D.   On: 09/11/2016 23:50    Assessment/Plan: Patient continues  to be confused. He is able to [facility] and is oriented x1. Still has BLE weakness, 4/5.    LOS: 3 days    Ocie Cornfield Meyran 09/12/2016, 8:24 AM   Agree w above.  [facility]

## 2016-09-12 NOTE — Progress Notes (Signed)
Received from PACU via bed VSS, A&O person, place, situation.  Denies pain presently R hip drain intact, ice to hip, hip precautions in place, SCDs on & demo incentive well.  Family at bedside & updated.  Bed alarm on.

## 2016-09-12 NOTE — Brief Op Note (Signed)
09/08/2016 - 09/12/2016  7:09 PM  PATIENT:  Jon Choi  66 y.o. male  PRE-OPERATIVE DIAGNOSIS:  INFECTED RIGHT total hip replacement  POST-OPERATIVE DIAGNOSIS:  INFECTED RIGHT total hip replacement  PROCEDURE:  Procedure(s): 1.  Excisional and non-excisional debridement of right hip 2. Removal of right acetabulum shell 3. Placement of antibiotic articulating spacer (Depuy prostalac 32 shell, 32+5 head ball)   SURGEON:  Surgeon(s) and Role:    [name], MD - Primary  PHYSICIAN ASSISTANT: Danae Orleans, PA-C  ANESTHESIA:   general  EBL:  200cc  BLOOD ADMINISTERED:none  DRAINS: none   LOCAL MEDICATIONS USED:  NONE  SPECIMEN:  Source of Specimen:  right hip joint fluid  DISPOSITION OF SPECIMEN:  PATHOLOGY  COUNTS:  YES  TOURNIQUET:  * No tourniquets in log *  DICTATION: .Other Dictation: Dictation Number [id number]  PLAN OF CARE: Admit to inpatient   PATIENT DISPOSITION:  PACU - hemodynamically stable.   Delay start of Pharmacological VTE agent (>24hrs) due to surgical blood loss or risk of bleeding: no

## 2016-09-12 NOTE — Transfer of Care (Signed)
Immediate Anesthesia Transfer of Care Note  Patient: Jon Choi  Procedure(s) Performed: Procedure(s): IRRIGATION AND DEBRIDEMENT HIP WITH FEMORAL HEAD AND ACETABULAR CUP EXCHANGE (Right)  Patient Location: PACU  Anesthesia Type:General  Level of Consciousness: awake, alert  and patient cooperative  Airway & Oxygen Therapy: Patient Spontanous Breathing and Patient connected to nasal cannula oxygen  Post-op Assessment: Report given to RN and Post -op Vital signs reviewed and stable  Post vital signs: Reviewed and stable  Last Vitals:  Vitals:   09/12/16 1306 09/12/16 1948  BP: (!) 125/56 128/89  Pulse: (!) 116 86  Resp: 16 14  Temp: 37 C 36.6 C    Last Pain:  Vitals:   09/12/16 1948  TempSrc:   PainSc: 0-No pain      Patients Stated Pain Goal: 2 (81/59/47 0761)  Complications: No apparent anesthesia complications

## 2016-09-12 NOTE — Progress Notes (Signed)
Patient ID: Jon Choi, male   DOB: [birth year], 66 y.o.   MRN: [id number] Current management per orthopedics, no new neurosurgical recommendations.

## 2016-09-12 NOTE — Progress Notes (Signed)
Patient ID: Jon Choi, male   DOB: 1950-03-03, 66 y.o.   MRN: [id number]  Seen for definitive management of right septic total hip replacement Seems to have a difficult with recall and conversation but is able to recall past medical surgical history but overall seems confused  Right hip infection in setting of sepsis/ iliopsoas abscess   To OR today for I&D right hip, removal of acetabular shell, placement of cemented acetabular shell with antibiotics in attempt to improve condition with hopes of curing infection May attempt to decompress abscess from iliopsoas from outside the pelvis  NPO Consent ordered Already on antibiotics for sepsis management

## 2016-09-13 ENCOUNTER — Encounter (HOSPITAL_COMMUNITY): Payer: Self-pay | Admitting: Orthopedic Surgery

## 2016-09-13 DIAGNOSIS — T8459XA Infection and inflammatory reaction due to other internal joint prosthesis, initial encounter: Secondary | ICD-10-CM

## 2016-09-13 DIAGNOSIS — L0291 Cutaneous abscess, unspecified: Secondary | ICD-10-CM

## 2016-09-13 DIAGNOSIS — N171 Acute kidney failure with acute cortical necrosis: Secondary | ICD-10-CM

## 2016-09-13 DIAGNOSIS — A4901 Methicillin susceptible Staphylococcus aureus infection, unspecified site: Secondary | ICD-10-CM

## 2016-09-13 DIAGNOSIS — K6812 Psoas muscle abscess: Secondary | ICD-10-CM

## 2016-09-13 DIAGNOSIS — M48061 Spinal stenosis, lumbar region without neurogenic claudication: Secondary | ICD-10-CM

## 2016-09-13 DIAGNOSIS — Z96649 Presence of unspecified artificial hip joint: Secondary | ICD-10-CM

## 2016-09-13 DIAGNOSIS — T8459XD Infection and inflammatory reaction due to other internal joint prosthesis, subsequent encounter: Secondary | ICD-10-CM

## 2016-09-13 DIAGNOSIS — I10 Essential (primary) hypertension: Secondary | ICD-10-CM

## 2016-09-13 LAB — GLUCOSE, CAPILLARY
GLUCOSE-CAPILLARY: 119 mg/dL — AB (ref 65–99)
GLUCOSE-CAPILLARY: 128 mg/dL — AB (ref 65–99)
GLUCOSE-CAPILLARY: 111 mg/dL — AB (ref 65–99)
Glucose-Capillary: 132 mg/dL — ABNORMAL HIGH (ref 65–99)

## 2016-09-13 LAB — BASIC METABOLIC PANEL
ANION GAP: 9 (ref 5–15)
BUN: 17 mg/dL (ref 6–20)
CHLORIDE: 98 mmol/L — AB (ref 101–111)
CO2: 23 mmol/L (ref 22–32)
Calcium: 8 mg/dL — ABNORMAL LOW (ref 8.9–10.3)
Creatinine, Ser: 0.75 mg/dL (ref 0.61–1.24)
GFR calc non Af Amer: >60 mL/min (ref >60)
Glucose, Bld: 119 mg/dL — ABNORMAL HIGH (ref 65–99)
POTASSIUM: 4 mmol/L (ref 3.5–5.1)
SODIUM: 130 mmol/L — AB (ref 135–145)

## 2016-09-13 LAB — CBC
HEMATOCRIT: 32 % — AB (ref 39.0–52.0)
HEMOGLOBIN: 10.7 g/dL — AB (ref 13.0–17.0)
MCH: 31.8 pg (ref 26.0–34.0)
MCHC: 33.4 g/dL (ref 30.0–36.0)
MCV: 95 fL (ref 78.0–100.0)
PLATELETS: 382 10*3/uL (ref 150–400)
RBC: 3.37 MIL/uL — AB (ref 4.22–5.81)
RDW: 12.8 % (ref 11.5–15.5)
WBC: 12.6 10*3/uL — AB (ref 4.0–10.5)

## 2016-09-13 MED ORDER — CEFAZOLIN SODIUM-DEXTROSE 2-4 GM/100ML-% IV SOLN
2.0000 g | Freq: Three times a day (TID) | INTRAVENOUS | Status: DC
  Administered 2016-09-14 – 2016-09-15 (×6): 2 g via INTRAVENOUS
  Filled 2016-09-13 (×7): qty 100

## 2016-09-13 MED ORDER — PANTOPRAZOLE SODIUM 40 MG PO TBEC
40.0000 mg | DELAYED_RELEASE_TABLET | Freq: Every day | ORAL | Status: DC
  Administered 2016-09-13 – 2016-09-15 (×3): 40 mg via ORAL
  Filled 2016-09-13 (×2): qty 1

## 2016-09-13 MED ORDER — CALCIUM CARBONATE ANTACID 500 MG PO CHEW
1.0000 | CHEWABLE_TABLET | Freq: Three times a day (TID) | ORAL | Status: DC | PRN
  Administered 2016-09-13: 200 mg via ORAL
  Filled 2016-09-13: qty 1

## 2016-09-13 MED ORDER — SODIUM CHLORIDE 0.9% FLUSH
10.0000 mL | INTRAVENOUS | Status: DC | PRN
  Administered 2016-09-15: 10 mL
  Filled 2016-09-13: qty 40

## 2016-09-13 NOTE — Op Note (Signed)
NAMEGARNETT, NUNZIATA               ACCOUNT NO.:  [id number]  MEDICAL RECORD NO.:  [id number]  LOCATION:                                 FACILITY:  PHYSICIAN:  Pietro Cassis. Alvan Dame, M.D.  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  PROCEDURE: 1. Excisional and nonexcisional debridement of right hip. 2. Removal of right acetabular component as well as femoral head     placement of an articulating antibiotic spacer utilizing a DePuy     Prostalac size 32 mm polyethylene liner cemented into place with a     32+ 5 metal ball.  SURGEON:  Pietro Cassis. Alvan Dame, M.D.  ASSISTANT:  Danae Orleans, PAC.  Note that Mr. Guinevere Scarlet was present for the entirety of the case from preoperative positioning, perioperative management of the operative extremity, general facilitation of case, and primary wound closure.  ANESTHESIA:  General.  SPECIMENS:  Some purulent fluid from the right hip joint was taken and sent to Pathology for Gram stain evaluation, anaerobic and aerobic.  DRAINS:  None.  BLOOD LOSS:  200 mL.  INDICATIONS:  Mr. Douty is a 66 year old male, who presented to the hospital about a week ago with diagnosis of sepsis.  He has significant medical complications and required an ICU stabilization.  Workup eventually led to pelvis CT, which indicated an iliopsoas abscess within the pelvis.  Later investigation including hip aspiration indicated infection of the right hip joint.  Despite antibiotic use during his initial recovery, these cultures grew out a staph infection of the right hip.  He has been on vancomycin and Ancef. Given his current predicament and diagnosis of an acute hematogenous infection of his right hip, I felt that it was in his best  interest at this point to proceed with surgery.  To try to salvage his joint of the hip that we would try to maintain his femoral component to prevent the morbidity of excising the femoral component.  We discussed the risks of recurrence of infection, need for future surgeries.  He seems to be still recovering from his significant and severe illness. I felt that this was the least of an operation that he could have to potentially decompress this area of infection, try to treat this area prior to proceeding with any other intervention.  If he has persistent recurring infection, then he may require full removal of his implant with antibiotic spacer as opposed to the ability to treat this infection and proceed with a revision/reimplantation.  Indications reviewed with him, but also with his family due to some confusion that he has had at this period of time, he has been sick.  PROCEDURE IN DETAIL:  The patient was brought to the operative theater. Once adequate anesthesia, preoperative antibiotics, which had been administered along the way, Ancef and vancomycin.  He was positioned into the left lateral decubitus position with the right hip up.  The right lower extremity was then prepped and draped in  sterile fashion. Time-out was performed identifying the patient, planned procedure, and extremity.  His old incision was demarcated on the skin and then incised.  Soft tissue planes created.  I then opened up his iliotibial band and gluteal fascia and encountered a pseudo capsule in the posterior aspect of the hip.  Once this was opened, his purulent fluid was drained from the hip. Cultures were taken at this time.  At this point, I performed a significant excisional debridement sharply with Bovie cautery carefully doing this around the acetabulum removing all capsular tissue that would have been in contact with intra-articular space.  Once the posterior two- thirds of the hip was  adequately debrided, I dislocated the hip, which was a previously placed ASR hip.  The femoral head was removed.  At this point, after elevating soft tissues on the ilium, placed the trunnion onto the ilium and retracted the femur anteriorly.  This allowed for further exposure of the acetabulum.  The acetabulum exposure allowed for then using Innomed cup cutters and I was able to remove the acetabulum with minimal bone loss.  We had used previously 56 mm cup.  I reamed with a 54 mm reamer in order to debride the acetabulum.  Following further debridement involving the anterior aspect of the hip joint, I was able to use a Kelly clamp as well as digitally palpate around the anterior aspect of the hip, trying to decompress, which was noted to be findings of an iliopsoas abscess that had been prior with a previous drain placed into it.  Once I had adequately debrided from an excisional standpoint, I did perform a nonexcisional debridement.  I then irrigated the hip with 3 L of normal saline solution with a pulse lavage.  At the completion of this, we mixed a bag of cement with 1 g of vancomycin and 1 of tobramycin to cement this polyethylene liner in place.  The Prostalac liner was opened.  There was an inner diameter 32 mm poly that was then cemented into a dried surface of the acetabulum and held in position until the cement had cured.  Based on a trial reduction, I selected a 32+ 5 ball.  This ball was then impacted on the clean and dried trunnion and then the hip ball snapped into the acetabular shell.  We irrigated again with another 1 L normal saline solution.  Given the adequacy of the radical nonexcisional and excisional debridement of this right hip that included soft tissue, muscle, and capsular tissue.  I am optimistic that we have the ability to treat this with a single stage.  I think the assistance of removing his acetabulum over the intra-articular infection allowing  potentially to decompress along the iliopsoas tract will help to allow improvement with antibiotic spacer in place.  He will be partial weightbearing.  He will continue on the medical service with antibiotics directed by either the medical service or physical or Infectious Disease. He should remain on IV antibiotics for 6 weeks for the intra-articular infection.  Further decision for reimplantation will be based on his response to treatment thus far rendered.     Pietro Cassis Alvan Dame, M.D.     MDO/MEDQ  D:  09/13/2016  T:  09/13/2016  Job:  [id number]

## 2016-09-13 NOTE — Progress Notes (Signed)
PROGRESS NOTE    Jon Choi  KDT:[id number] DOB: 1950-05-21 DOA: 09/08/2016 PCP: [name], MD    Brief Narrative:  66 y.o.malewith history of hypertension, hyperlipidemia, melanoma, right hip replacement, chronic back pain  with L4-L5 disc protrusion and spondylolisthesis  was brought to the ER for increasing back pain with difficulty ambulating. Imaging reveals a right psoas abscess and severe stenosis of L4-L5 with compression fx at L5.  7/17- received epidural steroid injection by Dr Ernestina Patches (pain management) 7/18- per note by Dr Ninfa Linden, needs referral to Dr Saintclair Halsted, [place] for surgical eval  Underwent lavage and then aspiration of right hip and Perc drain of right psoas  Assessment & Plan:   Principal Problem:   Sepsis ([place]) Active Problems:   HLD (hyperlipidemia)   Hypertension   Iliopsoas abscess ([place])   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-L5 level   ARF (acute renal failure) ([place])  Principal Problem:   Iliopsoas abscess- MSSA- right Hip aspirate showing the same    Sepsis - temp 100.3, WBC 14, HR in 100s, sepsis present on admission - lactic acid normal at 1.48 - IR guided drainage - drain still present -  Psoas abscess culture and right hip lavage culture showing MSSA - d/c  Vanc and Zosyn- started Ancef - blood cultures negative - ortho following for right prosthetic hip infection- patient underwent debridement of R hip and removal of R acetabulum shell on 7/31 - ID recs noted. Plan for 6 weeks of cefazolin through 9/10 - PICC requested, placed 8/1  Active Problems: Acute encephalopathy - unfortunately is likely due to narcotics- wife noted it at home in relation to Hydrocodone - 7/29 d/c'd Fentanyl and Valium -  7/30- no complaints of pain and quite confused-  changed Hydrocodone to Oxycodone 5-10 mg and d/c'd Morphine with plan to continue to wean narcotics-  today is is no longer confused but states he is in significant pain. Will resume Morphine which will likely  make him confused again.  - continue to wean Narcotics as tolerated    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-L5 level - neurosurgery notes that he is current not a candidate for surgery with an ongoing infectious process - Stable at present     ARF (acute renal failure)  - due to sepsis, dehydration - renal function normalized  Hyponatremia  - dehydration improved with IVF - sodium 130, stable - repeat bmet in AM  Hypokalemia - labs reviewed - potassium within normal limits  Hiccoughs - ContinueThorazine as needed  Hypertension - Continue Metoprolol as tolerated    HLD (hyperlipidemia) - Continue with Zocor - Appears stable at present  Nicotine abuse - per wife, he "vapes"  - Continued on Nicotine patch  DVT prophylaxis: SCD's Code Status: Full Family Communication: Pt in room, family not at bedside Disposition Plan: Uncertain at this time  Consultants:   Orthopedic Surgery  IR  Infectious disease  Neurosurgery  Procedures:   R septic hip surgery 7/31  PICC placement 8/1  Antimicrobials: Anti-infectives    Start     Dose/Rate Route Frequency Ordered Stop   09/13/16 0600  vancomycin (VANCOCIN) IVPB 1000 mg/200 mL premix  Status:  Discontinued     1,000 mg 200 mL/hr over 60 Minutes Intravenous On call to O.R. 09/12/16 1525 09/12/16 1900   09/12/16 1858  tobramycin (NEBCIN) powder  Status:  Discontinued       As needed 09/12/16 1858 09/12/16 1944   09/12/16 1857  vancomycin (VANCOCIN) powder  Status:  Discontinued  As needed 09/12/16 1857 09/12/16 1944   09/12/16 1730  ceFAZolin (ANCEF) IVPB 2g/100 mL premix     2 g 200 mL/hr over 30 Minutes Intravenous To ShortStay Surgical 09/12/16 1718 09/12/16 1815   09/12/16 1730  vancomycin (VANCOCIN) IVPB 1000 mg/200 mL premix  Status:  Discontinued     1,000 mg 200 mL/hr over 60 Minutes Intravenous To ShortStay Surgical 09/12/16 1719 09/12/16 2043   09/11/16 1800  ceFAZolin (ANCEF) IVPB 1 g/50 mL premix     1  g 100 mL/hr over 30 Minutes Intravenous Every 8 hours 09/11/16 1631     09/09/16 1000  vancomycin (VANCOCIN) IVPB 750 mg/150 ml premix  Status:  Discontinued     750 mg 150 mL/hr over 60 Minutes Intravenous Every 12 hours 09/09/16 0108 09/09/16 0853   09/09/16 1000  vancomycin (VANCOCIN) IVPB 1000 mg/200 mL premix  Status:  Discontinued     1,000 mg 200 mL/hr over 60 Minutes Intravenous Every 12 hours 09/09/16 0853 09/11/16 1631   09/09/16 0400  piperacillin-tazobactam (ZOSYN) IVPB 3.375 g  Status:  Discontinued     3.375 g 12.5 mL/hr over 240 Minutes Intravenous Every 8 hours 09/09/16 0108 09/11/16 1631   09/08/16 2115  piperacillin-tazobactam (ZOSYN) IVPB 3.375 g     3.375 g 100 mL/hr over 30 Minutes Intravenous  Once 09/08/16 2107 09/08/16 2218   09/08/16 2115  vancomycin (VANCOCIN) IVPB 1000 mg/200 mL premix     1,000 mg 200 mL/hr over 60 Minutes Intravenous  Once 09/08/16 2107 09/08/16 2320       Subjective: Complaining of continued back and hip pain  Objective: Vitals:   09/13/16 0400 09/13/16 0602 09/13/16 1057 09/13/16 1427  BP: 120/72 133/70 129/70 116/68  Pulse: 88 (!) 110 (!) 118 (!) 107  Resp: 19 18 17 18   Temp: 99 F (37.2 C) 99.4 F (37.4 C) 99.3 F (37.4 C) 98.8 F (37.1 C)  TempSrc: Oral Oral Oral Oral  SpO2: 99% 99% 95% 96%  Weight:      Height:        Intake/Output Summary (Last 24 hours) at 09/13/16 1601 Last data filed at 09/13/16 1444  Gross per 24 hour  Intake             3765 ml  Output              554 ml  Net             3211 ml   Filed Weights   09/08/16 1527 09/09/16 0130  Weight: 76.2 kg (168 lb) 82.1 kg (180 lb 14.4 oz)    Examination:  General exam: Appears calm and comfortable  Respiratory system: Clear to auscultation. Respiratory effort normal. Cardiovascular system: S1 & S2 heard, RRR Gastrointestinal system: Abdomen is nondistended, soft and nontender. No organomegaly or masses felt. Normal bowel sounds heard. Central  nervous system: Alert and oriented. No focal neurological deficits. Extremities: Symmetric 5 x 5 power. Skin: No rashes, lesions  Psychiatry: Judgement and insight appear normal. Mood & affect appropriate.   Data Reviewed: I have personally reviewed following labs and imaging studies  CBC:  Recent Labs Lab 09/08/16 1926 09/09/16 0359 09/10/16 0326 09/11/16 0334 09/12/16 0302 09/13/16 0752  WBC 14.7* 14.4* 13.0* 12.4* 11.9* 12.6*  NEUTROABS 13.0* 12.9*  --   --   --   --   HGB 12.7* 11.9* 11.4* 12.0* 12.5* 10.7*  HCT 37.3* 36.0* 34.5* 36.6* 37.2* 32.0*  MCV 94.2 94.7 95.3 97.3  95.1 95.0  PLT 219 235 272 325 361 811   Basic Metabolic Panel:  Recent Labs Lab 09/09/16 0359 09/10/16 0326 09/11/16 0334 09/12/16 0302 09/13/16 0752  NA 133* 134* 136 130* 130*  K 3.7 3.7 3.3* 3.8 4.0  CL 97* 99* 97* 97* 98*  CO2 25 27 30 25 23   GLUCOSE 114* 148* 120* 117* 119*  BUN 39* 26* 22* 20 17  CREATININE 1.19 0.91 0.92 0.84 0.75  CALCIUM 8.9 8.6* 8.7* 8.5* 8.0*   GFR: Estimated Creatinine Clearance: 106.8 mL/min (by C-G formula based on SCr of 0.75 mg/dL). Liver Function Tests:  Recent Labs Lab 09/08/16 1926 09/09/16 0359  AST 47* 41  ALT 35 31  ALKPHOS 73 72  BILITOT 1.2 1.0  PROT 6.9 6.6  ALBUMIN 2.7* 2.3*   No results for input(s): LIPASE, AMYLASE in the last 168 hours. No results for input(s): AMMONIA in the last 168 hours. Coagulation Profile: No results for input(s): INR, PROTIME in the last 168 hours. Cardiac Enzymes: No results for input(s): CKTOTAL, CKMB, CKMBINDEX, TROPONINI in the last 168 hours. BNP (last 3 results) No results for input(s): PROBNP in the last 8760 hours. HbA1C: No results for input(s): HGBA1C in the last 72 hours. CBG:  Recent Labs Lab 09/12/16 0730 09/12/16 1506 09/12/16 2118 09/13/16 0703 09/13/16 1159  GLUCAP 120* 97 140* 119* 128*   Lipid Profile: No results for input(s): CHOL, HDL, LDLCALC, TRIG, CHOLHDL, LDLDIRECT in the  last 72 hours. Thyroid Function Tests: No results for input(s): TSH, T4TOTAL, FREET4, T3FREE, THYROIDAB in the last 72 hours. Anemia Panel: No results for input(s): VITAMINB12, FOLATE, FERRITIN, TIBC, IRON, RETICCTPCT in the last 72 hours. Sepsis Labs:  Recent Labs Lab 09/08/16 1936 09/09/16 0359  PROCALCITON  --  1.60  LATICACIDVEN 1.48  --     Recent Results (from the past 240 hour(s))  Culture, blood (routine x 2)     Status: None (Preliminary result)   Collection Time: 09/09/16 12:20 AM  Result Value Ref Range Status   Specimen Description BLOOD RIGHT HAND  Final   Special Requests   Final    BOTTLES DRAWN AEROBIC AND ANAEROBIC Blood Culture adequate volume   Culture NO GROWTH 4 DAYS  Final   Report Status PENDING  Incomplete  Culture, blood (routine x 2)     Status: None (Preliminary result)   Collection Time: 09/09/16 12:25 AM  Result Value Ref Range Status   Specimen Description BLOOD RIGHT WRIST  Final   Special Requests   Final    BOTTLES DRAWN AEROBIC AND ANAEROBIC Blood Culture adequate volume   Culture NO GROWTH 4 DAYS  Final   Report Status PENDING  Incomplete  Body fluid culture     Status: None   Collection Time: 09/09/16 12:49 PM  Result Value Ref Range Status   Specimen Description FLUID RIGHT HIP  Final   Special Requests Normal  Final   Gram Stain   Final    MODERATE WBC PRESENT, PREDOMINANTLY PMN MODERATE GRAM POSITIVE COCCI IN PAIRS IN CLUSTERS    Culture MODERATE STAPHYLOCOCCUS AUREUS  Final   Report Status 09/12/2016 FINAL  Final   Organism ID, Bacteria STAPHYLOCOCCUS AUREUS  Final      Susceptibility   Staphylococcus aureus - MIC*    CIPROFLOXACIN <=0.5 SENSITIVE Sensitive     ERYTHROMYCIN <=0.25 SENSITIVE Sensitive     GENTAMICIN <=0.5 SENSITIVE Sensitive     OXACILLIN <=0.25 SENSITIVE Sensitive     TETRACYCLINE <=1  SENSITIVE Sensitive     VANCOMYCIN <=0.5 SENSITIVE Sensitive     TRIMETH/SULFA <=10 SENSITIVE Sensitive     CLINDAMYCIN  <=0.25 SENSITIVE Sensitive     RIFAMPIN <=0.5 SENSITIVE Sensitive     Inducible Clindamycin NEGATIVE Sensitive     * MODERATE STAPHYLOCOCCUS AUREUS  Aerobic/Anaerobic Culture (surgical/deep wound)     Status: None (Preliminary result)   Collection Time: 09/09/16  2:17 PM  Result Value Ref Range Status   Specimen Description ABSCESS RIGHT PELVIS  Final   Special Requests Normal  Final   Gram Stain   Final    ABUNDANT WBC PRESENT, PREDOMINANTLY PMN ABUNDANT GRAM POSITIVE COCCI IN CLUSTERS    Culture   Final    ABUNDANT STAPHYLOCOCCUS AUREUS NO ANAEROBES ISOLATED; CULTURE IN PROGRESS FOR 5 DAYS    Report Status PENDING  Incomplete   Organism ID, Bacteria STAPHYLOCOCCUS AUREUS  Final      Susceptibility   Staphylococcus aureus - MIC*    CIPROFLOXACIN <=0.5 SENSITIVE Sensitive     ERYTHROMYCIN <=0.25 SENSITIVE Sensitive     GENTAMICIN <=0.5 SENSITIVE Sensitive     OXACILLIN 0.5 SENSITIVE Sensitive     TETRACYCLINE <=1 SENSITIVE Sensitive     VANCOMYCIN <=0.5 SENSITIVE Sensitive     TRIMETH/SULFA <=10 SENSITIVE Sensitive     CLINDAMYCIN <=0.25 SENSITIVE Sensitive     RIFAMPIN <=0.5 SENSITIVE Sensitive     Inducible Clindamycin NEGATIVE Sensitive     * ABUNDANT STAPHYLOCOCCUS AUREUS  Surgical pcr screen     Status: None   Collection Time: 09/12/16  3:47 PM  Result Value Ref Range Status   MRSA, PCR NEGATIVE NEGATIVE Final   Staphylococcus aureus NEGATIVE NEGATIVE Final    Comment:        The Xpert SA Assay (FDA approved for NASAL specimens in patients over 32 years of age), is one component of a comprehensive surveillance program.  Test performance has been validated by [facility] for patients greater than or equal to 19 year old. It is not intended to diagnose infection nor to guide or monitor treatment.   Aerobic/Anaerobic Culture (surgical/deep wound)     Status: None (Preliminary result)   Collection Time: 09/12/16  6:26 PM  Result Value Ref Range Status   Specimen  Description ABSCESS RIGHT HIP  Final   Special Requests NONE  Final   Gram Stain   Final    ABUNDANT WBC PRESENT, PREDOMINANTLY PMN FEW GRAM POSITIVE COCCI IN CLUSTERS    Culture CULTURE REINCUBATED FOR BETTER GROWTH  Final   Report Status PENDING  Incomplete     Radiology Studies: Dg Hip Unilat With Pelvis 2-3 Views Right  Result Date: 09/11/2016 CLINICAL DATA:  Right hip pain.  No known injury. EXAM: DG HIP (WITH OR WITHOUT PELVIS) 2-3V RIGHT COMPARISON:  CT pelvis for abscess drain catheter placement 09/09/2016. CT pelvis 09/08/2016 FINDINGS: Postoperative left hip hemiarthroplasty with non cemented components. Components appear well seated. No evidence of acute fracture or dislocation in the pelvis or right hip. Pigtail drainage catheter projected over the right pelvis. SI joints and symphysis pubis are not displaced. Left hip demonstrates degenerative changes. IMPRESSION: Right hip hemiarthroplasty appears well seated. No acute fracture or dislocation. Drainage catheter projected over the right pelvis. Electronically Signed   By: Lucienne Capers M.D.   On: 09/11/2016 23:50    Scheduled Meds: . ferrous sulfate  325 mg Oral TID PC  . mouth rinse  15 mL Mouth Rinse BID  . metoprolol succinate  50 mg Oral Daily  . nicotine  14 mg Transdermal Daily  . pantoprazole  40 mg Oral Daily  . simvastatin  20 mg Oral Daily  . sodium chloride flush  5 mL Intravenous Q8H  . temazepam  30 mg Oral QHS   Continuous Infusions: . sodium chloride 100 mL/hr at 09/12/16 2006  .  ceFAZolin (ANCEF) IV Stopped (09/13/16 1045)  . chlorproMAZINE (THORAZINE) IV 25 mg (09/12/16 1053)  . methocarbamol (ROBAXIN)  IV       LOS: 4 days   Korey Arroyo, Orpah Melter, MD [facility] Pager [phone]  If 7PM-7AM, please contact night-coverage [email or web address] Password [email or web address] 09/13/2016, 4:01 PM

## 2016-09-13 NOTE — Progress Notes (Signed)
Referring Physician(s):  Dr. Debbe Odea  Supervising Physician: Marybelle Killings  Patient Status:  [facility] - In-pt  Chief Complaint:  Right iliacus abscess s/p drain placement 7/28 by Dr. Vernard Gambles.   Subjective: Patient s/p OR last night for I&D of hip.   Allergies: Patient has no known allergies.  Medications: Prior to Admission medications   Medication Sig Start Date End Date Taking? Authorizing Provider  celecoxib (CELEBREX) 200 MG capsule Take 200 mg by mouth daily.  08/19/16  Yes [provider]  diazepam (VALIUM) 5 MG tablet Take 1 tablet (5 mg total) by mouth every 8 (eight) hours as needed for muscle spasms. 09/03/16  Yes Virgel Manifold, MD  metoprolol succinate (TOPROL-XL) 50 MG 24 hr tablet Take 50 mg by mouth daily.    Yes [provider]  naproxen sodium (ANAPROX) 220 MG tablet Take 440 mg by mouth daily as needed (pain).   Yes [provider]  oxyCODONE-acetaminophen (PERCOCET/ROXICET) 5-325 MG tablet Take 1-2 tablets by mouth every 6 (six) hours as needed for severe pain. Patient taking differently: Take 1 tablet by mouth every 8 (eight) hours as needed for severe pain.  09/03/16  Yes Virgel Manifold, MD  simvastatin (ZOCOR) 20 MG tablet Take 20 mg by mouth daily.   Yes [provider]  temazepam (RESTORIL) 30 MG capsule Take 30 mg by mouth at bedtime.  08/24/16  Yes [provider]  tiZANidine (ZANAFLEX) 4 MG tablet Take 1 tablet (4 mg total) by mouth every 8 (eight) hours as needed for muscle spasms. 08/30/16  Yes Mcarthur Rossetti, MD  traMADol (ULTRAM) 50 MG tablet TAKE 1 TO 2 TABLETS BY MOUTH TWICE A DAY AS NEEDED FOR PAIN. Patient taking differently: TAKE 50MG -100MG  BY MOUTH EVERY 8 HOURS AS NEEDED FOR PAIN. 09/08/16  Yes Mcarthur Rossetti, MD  HYDROcodone-acetaminophen (NORCO) 10-325 MG tablet TAKE 1 TABLET BY MOUTH EVERY 6 HOURS AS NEEDED. 09/11/16   Mcarthur Rossetti, MD     Vital Signs: BP 129/70 (BP  Location: Left Arm)   Pulse (!) 118   Temp 99.3 F (37.4 C) (Oral)   Resp 17   Ht 6\' 1"  (1.854 m)   Wt 180 lb 14.4 oz (82.1 kg)   SpO2 95%   BMI 23.87 kg/m   Physical Exam  NAD, alert MSK: right pelvic abscess drain in place with bloody output.  Insertion site with dry blood, but appears intact and without erythema or warmth.   Imaging: Dg Hip Unilat With Pelvis 2-3 Views Right  Result Date: 09/11/2016 CLINICAL DATA:  Right hip pain.  No known injury. EXAM: DG HIP (WITH OR WITHOUT PELVIS) 2-3V RIGHT COMPARISON:  CT pelvis for abscess drain catheter placement 09/09/2016. CT pelvis 09/08/2016 FINDINGS: Postoperative left hip hemiarthroplasty with non cemented components. Components appear well seated. No evidence of acute fracture or dislocation in the pelvis or right hip. Pigtail drainage catheter projected over the right pelvis. SI joints and symphysis pubis are not displaced. Left hip demonstrates degenerative changes. IMPRESSION: Right hip hemiarthroplasty appears well seated. No acute fracture or dislocation. Drainage catheter projected over the right pelvis. Electronically Signed   By: Lucienne Capers M.D.   On: 09/11/2016 23:50    Labs:  CBC:  Recent Labs  09/10/16 0326 09/11/16 0334 09/12/16 0302 09/13/16 0752  WBC 13.0* 12.4* 11.9* 12.6*  HGB 11.4* 12.0* 12.5* 10.7*  HCT 34.5* 36.6* 37.2* 32.0*  PLT 272 325 361 382    COAGS: No results  for input(s): INR, APTT in the last 8760 hours.  BMP:  Recent Labs  09/10/16 0326 09/11/16 0334 09/12/16 0302 09/13/16 0752  NA 134* 136 130* 130*  K 3.7 3.3* 3.8 4.0  CL 99* 97* 97* 98*  CO2 27 30 25 23   GLUCOSE 148* 120* 117* 119*  BUN 26* 22* 20 17  CALCIUM 8.6* 8.7* 8.5* 8.0*  CREATININE 0.91 0.92 0.84 0.75  GFRNONAA >60 >60 >60 >60  GFRAA >60 >60 >60 >60    LIVER FUNCTION TESTS:  Recent Labs  09/08/16 1926 09/09/16 0359  BILITOT 1.2 1.0  AST 47* 41  ALT 35 31  ALKPHOS 73 72  PROT 6.9 6.6  ALBUMIN 2.7*  2.3*    Assessment and Plan: Right iliacus abscess s/p pelvic drain placement 7/28 by Dr. Vernard Gambles Patient s/p OR yesterday for debridement of infected right hip.  Appears drain was left in place s/p surgery.  Currently with bloody output.  Site intact.  Will follow output. Continue drain care.   Electronically Signed: Docia Barrier, PA 09/13/2016, 1:54 PM   I spent a total of 15 Minutes at the the patient's bedside AND on the patient's hospital floor or unit, greater than 50% of which was counseling/coordinating care for iliacus abscess.

## 2016-09-13 NOTE — Evaluation (Signed)
Physical Therapy Evaluation Patient Details Name: Jon Choi MRN: [id number] DOB: 22-Feb-1950 Today's Date: 09/13/2016   History of Present Illness  Jon Choi is a 66 y.o. male with a history of a hip replacement in 2007; hypertension, hyperlipidemia and back pain due to disc protrusion came in with worsening back pain and BLE numbness. He does typically get injections in the back at the L4-5 region due to pain. CT scan here noted a loculated abscess and a drain was placed on 7/28 with culture positive for MSSA.  He has had some intermittent confusion suspected to be from pain medications. Pt is s/p I&D of Right HIP WITH FEMORAL HEAD AND ACETABULAR CUP EXCHANGE.  Clinical Impression  Pt agreeable to mobilize with PT. Pt states PTA he has had complications with back injections and had been using cane for ambulation for ~ 2 weeks. Pt states his son will be able to assist him some at home, although he is in school locally at times. Pt required continuous VCs for maintaining hip precautions, but was able to transfer from bed to chair with +2 assist. Pt presents with deficits listed in PT problem list below and will benefit from continued acute therapy for increased mobilization and strengthening to progress to PLOF and for safe d/c. At this time, feel CIR is appropriate next step for progression in order for pt to progress prior to returning home.     Follow Up Recommendations CIR    Equipment Recommendations  None recommended by PT    Recommendations for Other Services Rehab consult     Precautions / Restrictions Precautions Precautions: Posterior Hip;Fall Precaution Booklet Issued: Yes (comment) Precaution Comments: Posterior hip precautions and PWB 50% Rt LE Restrictions Weight Bearing Restrictions: Yes      Mobility  Bed Mobility Overal bed mobility: Needs Assistance Bed Mobility: Supine to Sit     Supine to sit: Max assist     General bed mobility comments: Max A to  rise and bring Rt LE off bed while maintaining posterior hip precautions   Transfers Overall transfer level: Needs assistance   Transfers: Sit to/from Stand;Stand Pivot Transfers Sit to Stand: +2 physical assistance;Mod assist Stand pivot transfers: Min assist;+2 safety/equipment       General transfer comment: Min A for sequencing and use of RW. VCs for maintaining precautions and turning to chair   Ambulation/Gait                Stairs            Wheelchair Mobility    Modified Rankin (Stroke Patients Only)       Balance Overall balance assessment: Needs assistance Sitting-balance support: Bilateral upper extremity supported;Feet unsupported Sitting balance-Leahy Scale: Fair     Standing balance support: Bilateral upper extremity supported;During functional activity Standing balance-Leahy Scale: Poor Standing balance comment: Heavy reliance on RW for UE support                              Pertinent Vitals/Pain Pain Assessment: 0-10 Pain Score: 6  Pain Location: Rt hip Pain Descriptors / Indicators: Discomfort Pain Intervention(s): Limited activity within patient's tolerance;Monitored during session;Premedicated before session;Repositioned    Home Living Family/patient expects to be discharged to:: Private residence Living Arrangements: Spouse/significant other Available Help at Discharge: Family Type of Home: House Home Access: Stairs to enter Entrance Stairs-Rails: Right Entrance Stairs-Number of Steps: 3 Home Layout: One level Home Equipment: Environmental consultant -  2 wheels;Cane - single point      Prior Function Level of Independence: Independent               Hand Dominance        Extremity/Trunk Assessment   Upper Extremity Assessment Upper Extremity Assessment: Overall WFL for tasks assessed    Lower Extremity Assessment Lower Extremity Assessment: RLE deficits/detail RLE Deficits / Details: weakness and limited mobility  secondary to complications with THA and infection     Cervical / Trunk Assessment Cervical / Trunk Assessment: Normal  Communication   Communication: No difficulties  Cognition Arousal/Alertness: Awake/alert Behavior During Therapy: WFL for tasks assessed/performed Overall Cognitive Status: Within Functional Limits for tasks assessed                                        General Comments      Exercises     Assessment/Plan    PT Assessment Patient needs continued PT services  PT Problem List Decreased strength;Decreased activity tolerance;Decreased balance;Decreased mobility;Pain       PT Treatment Interventions DME instruction;Gait training;Stair training;Functional mobility training;Therapeutic activities;Therapeutic exercise;Balance training;Patient/family education    PT Goals (Current goals can be found in the Care Plan section)  Acute Rehab PT Goals Patient Stated Goal: decrease pain  PT Goal Formulation: With patient Time For Goal Achievement: 09/27/16 Potential to Achieve Goals: Good    Frequency Min 3X/week   Barriers to discharge        Co-evaluation               AM-PAC PT "6 Clicks" Daily Activity  Outcome Measure Difficulty turning over in bed (including adjusting bedclothes, sheets and blankets)?: A Lot Difficulty moving from lying on back to sitting on the side of the bed? : A Lot Difficulty sitting down on and standing up from a chair with arms (e.g., wheelchair, bedside commode, etc,.)?: A Lot Help needed moving to and from a bed to chair (including a wheelchair)?: A Lot Help needed walking in hospital room?: Total Help needed climbing 3-5 steps with a railing? : Total 6 Click Score: 10    End of Session Equipment Utilized During Treatment: Gait belt Activity Tolerance: Patient limited by pain Patient left: in chair;with call bell/phone within reach;with chair alarm set Nurse Communication: Mobility status;Patient  requests pain meds PT Visit Diagnosis: Unsteadiness on feet (R26.81);Other abnormalities of gait and mobility (R26.89);Muscle weakness (generalized) (M62.81);Difficulty in walking, not elsewhere classified (R26.2);Pain Pain - Right/Left: Right Pain - part of body: Hip    Time: 1620-1706 PT Time Calculation (min) (ACUTE ONLY): 46 min   Charges:   PT Evaluation $PT Eval Moderate Complexity: 1 Mod PT Treatments $Therapeutic Exercise: 8-22 mins $Therapeutic Activity: 8-22 mins   PT G CodesElberta Leatherwood, SPT Acute Rehab [place] 09/13/2016, 5:58 PM

## 2016-09-13 NOTE — Progress Notes (Addendum)
Subjective: Still with hip and back pain, abdominal pain   Antibiotics:  Anti-infectives    Start     Dose/Rate Route Frequency Ordered Stop   09/13/16 0600  vancomycin (VANCOCIN) IVPB 1000 mg/200 mL premix  Status:  Discontinued     1,000 mg 200 mL/hr over 60 Minutes Intravenous On call to O.R. 09/12/16 1525 09/12/16 1900   09/12/16 1858  tobramycin (NEBCIN) powder  Status:  Discontinued       As needed 09/12/16 1858 09/12/16 1944   09/12/16 1857  vancomycin (VANCOCIN) powder  Status:  Discontinued       As needed 09/12/16 1857 09/12/16 1944   09/12/16 1730  ceFAZolin (ANCEF) IVPB 2g/100 mL premix     2 g 200 mL/hr over 30 Minutes Intravenous To ShortStay Surgical 09/12/16 1718 09/12/16 1815   09/12/16 1730  vancomycin (VANCOCIN) IVPB 1000 mg/200 mL premix  Status:  Discontinued     1,000 mg 200 mL/hr over 60 Minutes Intravenous To ShortStay Surgical 09/12/16 1719 09/12/16 2043   09/11/16 1800  ceFAZolin (ANCEF) IVPB 1 g/50 mL premix     1 g 100 mL/hr over 30 Minutes Intravenous Every 8 hours 09/11/16 1631     09/09/16 1000  vancomycin (VANCOCIN) IVPB 750 mg/150 ml premix  Status:  Discontinued     750 mg 150 mL/hr over 60 Minutes Intravenous Every 12 hours 09/09/16 0108 09/09/16 0853   09/09/16 1000  vancomycin (VANCOCIN) IVPB 1000 mg/200 mL premix  Status:  Discontinued     1,000 mg 200 mL/hr over 60 Minutes Intravenous Every 12 hours 09/09/16 0853 09/11/16 1631   09/09/16 0400  piperacillin-tazobactam (ZOSYN) IVPB 3.375 g  Status:  Discontinued     3.375 g 12.5 mL/hr over 240 Minutes Intravenous Every 8 hours 09/09/16 0108 09/11/16 1631   09/08/16 2115  piperacillin-tazobactam (ZOSYN) IVPB 3.375 g     3.375 g 100 mL/hr over 30 Minutes Intravenous  Once 09/08/16 2107 09/08/16 2218   09/08/16 2115  vancomycin (VANCOCIN) IVPB 1000 mg/200 mL premix     1,000 mg 200 mL/hr over 60 Minutes Intravenous  Once 09/08/16 2107 09/08/16 2320      Medications: Scheduled  Meds: . ferrous sulfate  325 mg Oral TID PC  . mouth rinse  15 mL Mouth Rinse BID  . metoprolol succinate  50 mg Oral Daily  . nicotine  14 mg Transdermal Daily  . pantoprazole  40 mg Oral Daily  . simvastatin  20 mg Oral Daily  . sodium chloride flush  5 mL Intravenous Q8H  . temazepam  30 mg Oral QHS   Continuous Infusions: . sodium chloride 100 mL/hr at 09/12/16 2006  .  ceFAZolin (ANCEF) IV Stopped (09/13/16 1807)  . chlorproMAZINE (THORAZINE) IV 25 mg (09/12/16 1053)  . methocarbamol (ROBAXIN)  IV     PRN Meds:.acetaminophen **OR** acetaminophen, calcium carbonate, chlorproMAZINE (THORAZINE) IV, menthol-cetylpyridinium **OR** phenol, methocarbamol (ROBAXIN)  IV, metoCLOPramide **OR** metoCLOPramide (REGLAN) injection, morphine injection, ondansetron **OR** ondansetron (ZOFRAN) IV, oxyCODONE, sodium chloride flush    Objective: Weight change:   Intake/Output Summary (Last 24 hours) at 09/13/16 1907 Last data filed at 09/13/16 1444  Gross per 24 hour  Intake             2765 ml  Output              554 ml  Net             2211  ml   Blood pressure 121/64, pulse (!) 104, temperature 98.7 F (37.1 C), temperature source Oral, resp. rate 16, height 6\' 1"  (1.854 m), weight 180 lb 14.4 oz (82.1 kg), SpO2 96 %. Temp:  [97.3 F (36.3 C)-100.2 F (37.9 C)] 98.7 F (37.1 C) (08/01 1758) Pulse Rate:  [86-118] 104 (08/01 1758) Resp:  [14-19] 16 (08/01 1758) BP: (102-149)/(64-89) 121/64 (08/01 1758) SpO2:  [95 %-100 %] 96 % (08/01 1758)  Physical Exam: General: Alert and awake slightly confused HEENT: anicteric sclera, pupils reactive to light and accommodation, EOMI CVS  Tachy  rate, normal r,  no murmur rubs or gallops Chest: clear to auscultation bilaterally, no wheezing, rales or rhonchi Abdomen: soft nontender, nondistended, normal bowel sounds, Extremities: skin: dressing at hip site, he has drain with bloody material coming from abdomen  Lymph: no new  lymphadenopathy Neuro: nonfocal  CBC:  CBC Latest Ref Rng & Units 09/13/2016 09/12/2016 09/11/2016  WBC 4.0 - 10.5 K/uL 12.6(H) 11.9(H) 12.4(H)  Hemoglobin 13.0 - 17.0 g/dL 10.7(L) 12.5(L) 12.0(L)  Hematocrit 39.0 - 52.0 % 32.0(L) 37.2(L) 36.6(L)  Platelets 150 - 400 K/uL 382 361 325      BMET  Recent Labs  09/12/16 0302 09/13/16 0752  NA 130* 130*  K 3.8 4.0  CL 97* 98*  CO2 25 23  GLUCOSE 117* 119*  BUN 20 17  CREATININE 0.84 0.75  CALCIUM 8.5* 8.0*     Liver Panel  No results for input(s): PROT, ALBUMIN, AST, ALT, ALKPHOS, BILITOT, BILIDIR, IBILI in the last 72 hours.     Sedimentation Rate No results for input(s): ESRSEDRATE in the last 72 hours. C-Reactive Protein No results for input(s): CRP in the last 72 hours.  Micro Results: Recent Results (from the past 720 hour(s))  Culture, blood (routine x 2)     Status: None (Preliminary result)   Collection Time: 09/09/16 12:20 AM  Result Value Ref Range Status   Specimen Description BLOOD RIGHT HAND  Final   Special Requests   Final    BOTTLES DRAWN AEROBIC AND ANAEROBIC Blood Culture adequate volume   Culture NO GROWTH 4 DAYS  Final   Report Status PENDING  Incomplete  Culture, blood (routine x 2)     Status: None (Preliminary result)   Collection Time: 09/09/16 12:25 AM  Result Value Ref Range Status   Specimen Description BLOOD RIGHT WRIST  Final   Special Requests   Final    BOTTLES DRAWN AEROBIC AND ANAEROBIC Blood Culture adequate volume   Culture NO GROWTH 4 DAYS  Final   Report Status PENDING  Incomplete  Body fluid culture     Status: None   Collection Time: 09/09/16 12:49 PM  Result Value Ref Range Status   Specimen Description FLUID RIGHT HIP  Final   Special Requests Normal  Final   Gram Stain   Final    MODERATE WBC PRESENT, PREDOMINANTLY PMN MODERATE GRAM POSITIVE COCCI IN PAIRS IN CLUSTERS    Culture MODERATE STAPHYLOCOCCUS AUREUS  Final   Report Status 09/12/2016 FINAL  Final    Organism ID, Bacteria STAPHYLOCOCCUS AUREUS  Final      Susceptibility   Staphylococcus aureus - MIC*    CIPROFLOXACIN <=0.5 SENSITIVE Sensitive     ERYTHROMYCIN <=0.25 SENSITIVE Sensitive     GENTAMICIN <=0.5 SENSITIVE Sensitive     OXACILLIN <=0.25 SENSITIVE Sensitive     TETRACYCLINE <=1 SENSITIVE Sensitive     VANCOMYCIN <=0.5 SENSITIVE Sensitive     TRIMETH/SULFA <=  10 SENSITIVE Sensitive     CLINDAMYCIN <=0.25 SENSITIVE Sensitive     RIFAMPIN <=0.5 SENSITIVE Sensitive     Inducible Clindamycin NEGATIVE Sensitive     * MODERATE STAPHYLOCOCCUS AUREUS  Aerobic/Anaerobic Culture (surgical/deep wound)     Status: None (Preliminary result)   Collection Time: 09/09/16  2:17 PM  Result Value Ref Range Status   Specimen Description ABSCESS RIGHT PELVIS  Final   Special Requests Normal  Final   Gram Stain   Final    ABUNDANT WBC PRESENT, PREDOMINANTLY PMN ABUNDANT GRAM POSITIVE COCCI IN CLUSTERS    Culture   Final    ABUNDANT STAPHYLOCOCCUS AUREUS NO ANAEROBES ISOLATED; CULTURE IN PROGRESS FOR 5 DAYS    Report Status PENDING  Incomplete   Organism ID, Bacteria STAPHYLOCOCCUS AUREUS  Final      Susceptibility   Staphylococcus aureus - MIC*    CIPROFLOXACIN <=0.5 SENSITIVE Sensitive     ERYTHROMYCIN <=0.25 SENSITIVE Sensitive     GENTAMICIN <=0.5 SENSITIVE Sensitive     OXACILLIN 0.5 SENSITIVE Sensitive     TETRACYCLINE <=1 SENSITIVE Sensitive     VANCOMYCIN <=0.5 SENSITIVE Sensitive     TRIMETH/SULFA <=10 SENSITIVE Sensitive     CLINDAMYCIN <=0.25 SENSITIVE Sensitive     RIFAMPIN <=0.5 SENSITIVE Sensitive     Inducible Clindamycin NEGATIVE Sensitive     * ABUNDANT STAPHYLOCOCCUS AUREUS  Surgical pcr screen     Status: None   Collection Time: 09/12/16  3:47 PM  Result Value Ref Range Status   MRSA, PCR NEGATIVE NEGATIVE Final   Staphylococcus aureus NEGATIVE NEGATIVE Final    Comment:        The Xpert SA Assay (FDA approved for NASAL specimens in patients over 21 years of  age), is one component of a comprehensive surveillance program.  Test performance has been validated by [facility] for patients greater than or equal to 62 year old. It is not intended to diagnose infection nor to guide or monitor treatment.   Aerobic/Anaerobic Culture (surgical/deep wound)     Status: None (Preliminary result)   Collection Time: 09/12/16  6:26 PM  Result Value Ref Range Status   Specimen Description ABSCESS RIGHT HIP  Final   Special Requests NONE  Final   Gram Stain   Final    ABUNDANT WBC PRESENT, PREDOMINANTLY PMN FEW GRAM POSITIVE COCCI IN CLUSTERS    Culture CULTURE REINCUBATED FOR BETTER GROWTH  Final   Report Status PENDING  Incomplete    Studies/Results: Dg Hip Unilat With Pelvis 2-3 Views Right  Result Date: 09/11/2016 CLINICAL DATA:  Right hip pain.  No known injury. EXAM: DG HIP (WITH OR WITHOUT PELVIS) 2-3V RIGHT COMPARISON:  CT pelvis for abscess drain catheter placement 09/09/2016. CT pelvis 09/08/2016 FINDINGS: Postoperative left hip hemiarthroplasty with non cemented components. Components appear well seated. No evidence of acute fracture or dislocation in the pelvis or right hip. Pigtail drainage catheter projected over the right pelvis. SI joints and symphysis pubis are not displaced. Left hip demonstrates degenerative changes. IMPRESSION: Right hip hemiarthroplasty appears well seated. No acute fracture or dislocation. Drainage catheter projected over the right pelvis. Electronically Signed   By: Lucienne Capers M.D.   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 male with History of hip replacement 2017 back pain due to disc protrusion now admitted and found to have loculated psoas abscess  sp IR drain, infeccted hip with MSSA sp 1. Excisional and nonexcisional debridement of right hip.2. Removal of right acetabular component as well as femoral head  placement of an articulating antibiotic spacer   --continue cefazolin and would give 6-8 week course. I am increasing dose to 2 g IV q 8 --clarify with Dr Alvan Dame if all hardware is truly out --may need repeat imaging to ensure his psoas abscess is resolved    LOS: 4 days   Alcide Evener 09/13/2016, 7:07 PM

## 2016-09-13 NOTE — Progress Notes (Signed)
Patient ID: Jon Choi, male   DOB: 10-Jan-1951, 66 y.o.   MRN: [id number] Subjective: 1 Day Post-Op Procedure(s) (LRB): IRRIGATION AND DEBRIDEMENT HIP WITH FEMORAL HEAD AND ACETABULAR CUP EXCHANGE (Right)    Patient reports pain as mild to moderate. No events  Objective:   VITALS:   Vitals:   09/13/16 0602 09/13/16 1057  BP: 133/70 129/70  Pulse: (!) 110 (!) 118  Resp: 18 17  Temp: 99.4 F (37.4 C) 99.3 F (37.4 C)    Neurovascular intact Incision: dressing C/D/I  LABS  Recent Labs  09/11/16 0334 09/12/16 0302 09/13/16 0752  HGB 12.0* 12.5* 10.7*  HCT 36.6* 37.2* 32.0*  WBC 12.4* 11.9* 12.6*  PLT 325 361 382     Recent Labs  09/11/16 0334 09/12/16 0302 09/13/16 0752  NA 136 130* 130*  K 3.3* 3.8 4.0  BUN 22* 20 17  CREATININE 0.92 0.84 0.75  GLUCOSE 120* 117* 119*    No results for input(s): LABPT, INR in the last 72 hours.   Assessment/Plan: 1 Day Post-Op Procedure(s) (LRB): IRRIGATION AND DEBRIDEMENT HIP WITH FEMORAL HEAD AND ACETABULAR CUP EXCHANGE (Right)   Advance diet Up with therapy   IV antibiotics culture specific for 6 weeks ID consult and follow up for infected right total hip PWB RLE with PT Will need PIC line for IV antibiotics for 6 weeks Plans for other surgery depend on treatment and response Probably should be on some chemoprophylaxis for DVY - would be nice to avoid agents more than aspirin.  Would recommend 81mg  chewable ASA twice a day for 4 weeks in addition to activity and TED to reduce risks of bleeding on other agents

## 2016-09-13 NOTE — Progress Notes (Signed)
OT Cancellation Note  Patient Details Name: TAMARI BUSIC MRN: [id number] DOB: Sep 30, 1950   Cancelled Treatment:    Reason Eval/Treat Not Completed: Patient at procedure or test/ unavailable. IV team starting PICC line. OT will attempt to see later as schedule allows.  Merri Ray Jaegar Croft 09/13/2016, 3:09 PM  Hulda Humphrey OTR/L [phone]

## 2016-09-13 NOTE — Progress Notes (Signed)
Peripherally Inserted Central Catheter/Midline Placement  The IV Nurse has discussed with the patient and/or persons authorized to consent for the patient, the purpose of this procedure and the potential benefits and risks involved with this procedure.  The benefits include less needle sticks, lab draws from the catheter, and the patient may be discharged home with the catheter. Risks include, but not limited to, infection, bleeding, blood clot (thrombus formation), and puncture of an artery; nerve damage and irregular heartbeat and possibility to perform a PICC exchange if needed/ordered by physician.  Alternatives to this procedure were also discussed.  Bard Power PICC patient education guide, fact sheet on infection prevention and patient information card has been provided to patient /or left at bedside.    PICC/Midline Placement Documentation        Jon Choi 09/13/2016, 4:03 PM

## 2016-09-14 ENCOUNTER — Telehealth (INDEPENDENT_AMBULATORY_CARE_PROVIDER_SITE_OTHER): Payer: Self-pay

## 2016-09-14 ENCOUNTER — Encounter (HOSPITAL_COMMUNITY): Payer: Self-pay | Admitting: Physical Medicine and Rehabilitation

## 2016-09-14 DIAGNOSIS — E784 Other hyperlipidemia: Secondary | ICD-10-CM

## 2016-09-14 DIAGNOSIS — T8459XS Infection and inflammatory reaction due to other internal joint prosthesis, sequela: Secondary | ICD-10-CM

## 2016-09-14 LAB — CBC
HEMATOCRIT: 29.3 % — AB (ref 39.0–52.0)
HEMOGLOBIN: 9.7 g/dL — AB (ref 13.0–17.0)
MCH: 31.8 pg (ref 26.0–34.0)
MCHC: 33.1 g/dL (ref 30.0–36.0)
MCV: 96.1 fL (ref 78.0–100.0)
Platelets: 395 10*3/uL (ref 150–400)
RBC: 3.05 MIL/uL — AB (ref 4.22–5.81)
RDW: 13 % (ref 11.5–15.5)
WBC: 13.1 10*3/uL — AB (ref 4.0–10.5)

## 2016-09-14 LAB — GLUCOSE, CAPILLARY
GLUCOSE-CAPILLARY: 100 mg/dL — AB (ref 65–99)
Glucose-Capillary: 107 mg/dL — ABNORMAL HIGH (ref 65–99)
Glucose-Capillary: 119 mg/dL — ABNORMAL HIGH (ref 65–99)
Glucose-Capillary: 87 mg/dL (ref 65–99)

## 2016-09-14 LAB — AEROBIC/ANAEROBIC CULTURE (SURGICAL/DEEP WOUND)

## 2016-09-14 LAB — BASIC METABOLIC PANEL
ANION GAP: 6 (ref 5–15)
BUN: 20 mg/dL (ref 6–20)
CO2: 25 mmol/L (ref 22–32)
Calcium: 8.1 mg/dL — ABNORMAL LOW (ref 8.9–10.3)
Chloride: 101 mmol/L (ref 101–111)
Creatinine, Ser: 0.89 mg/dL (ref 0.61–1.24)
GFR calc Af Amer: >60 mL/min (ref >60)
GLUCOSE: 106 mg/dL — AB (ref 65–99)
POTASSIUM: 3.5 mmol/L (ref 3.5–5.1)
Sodium: 132 mmol/L — ABNORMAL LOW (ref 135–145)

## 2016-09-14 LAB — CULTURE, BLOOD (ROUTINE X 2)
CULTURE: NO GROWTH
Culture: NO GROWTH
SPECIAL REQUESTS: ADEQUATE
SPECIAL REQUESTS: ADEQUATE

## 2016-09-14 LAB — AEROBIC/ANAEROBIC CULTURE W GRAM STAIN (SURGICAL/DEEP WOUND): Special Requests: NORMAL

## 2016-09-14 LAB — SEDIMENTATION RATE: Sed Rate: 104 mm/hr — ABNORMAL HIGH (ref 0–16)

## 2016-09-14 LAB — C-REACTIVE PROTEIN: CRP: 20.1 mg/dL — ABNORMAL HIGH (ref <1.0)

## 2016-09-14 NOTE — Progress Notes (Signed)
Patient ID: Jon Choi, male   DOB: September 26, 1950, 66 y.o.   MRN: [id number] Subjective: 2 Days Post-Op Procedure(s) (LRB): IRRIGATION AND DEBRIDEMENT HIP WITH FEMORAL HEAD AND ACETABULAR CUP EXCHANGE (Right)    Patient continues to be a bit confused but will orient to prior conversations but seems to struggle coming up with questions or comments pertaining to this hospitalization.   No events reported.  Objective:   VITALS:   Vitals:   09/14/16 0100 09/14/16 0549  BP: 132/68 117/67  Pulse: 97 88  Resp: 18 18  Temp: 99.2 F (37.3 C) 98.6 F (37 C)    Neurovascular intact Incision: dressing C/D/I - right hip  LABS  Recent Labs  09/12/16 0302 09/13/16 0752 09/14/16 0355  HGB 12.5* 10.7* 9.7*  HCT 37.2* 32.0* 29.3*  WBC 11.9* 12.6* 13.1*  PLT 361 382 395     Recent Labs  09/12/16 0302 09/13/16 0752 09/14/16 0355  NA 130* 130* 132*  K 3.8 4.0 3.5  BUN 20 17 20   CREATININE 0.84 0.75 0.89  GLUCOSE 117* 119* 106*    No results for input(s): LABPT, INR in the last 72 hours.   Assessment/Plan: 2 Days Post-Op Procedure(s) (LRB): IRRIGATION AND DEBRIDEMENT HIP WITH FEMORAL HEAD AND ACETABULAR CUP EXCHANGE (Right)   Advance diet Up with therapy   Continue appropriate antibiotics for 6 weeks Drain in pelvis abscess per IR and medicine, could not find output recorded  PWB RLE Future course as it pertains to his right will be determined by response to treatment and radiographic confirmation of abscess resolution

## 2016-09-14 NOTE — Progress Notes (Addendum)
Date of Admission:  09/08/2016     Recommendations: - Continue cefazolin for 6 weeks (7/30-9/10) to treat prosthetic joint infection with MSSA. - Repeat morning CBC - Repeat CT pelvis at 4 weeks to assess treatment response - Follow up with ID clinic in 4-6 weeks. Patient will follow IV antibiotics with oral therapy.  Thank you for this consult. We will sign off, feel free to call should any questions arise.   ASSESSMENT: Jon Choi is a 66 y.o. male with a history of a hip replacement in 2007 and back pain due to disc protrusion came in with worsening back pain. He had fallen about one week before presentation. He typically gets corticosteroid injections in the back at the L4-5 region due to pain. CT scan here noted a loculated abscess and a drain was placed on 7/28 with culture positive for MSSA.  He has had some intermittent confusion suspected to be from pain medications which has since improved. Patient has been afebrile and hemodynamically stable during admission. Had recent WBC increase to 13 from 10, unclear in significance but possibly attributable to surgery, we can repeat in the morning.   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 ferrous sulfate  325 mg Oral TID PC  . mouth rinse  15 mL Mouth Rinse BID  . metoprolol succinate  50 mg Oral Daily  . nicotine  14 mg Transdermal Daily  . pantoprazole  40 mg Oral Daily  . simvastatin  20 mg Oral Daily  . sodium chloride flush  5 mL Intravenous Q8H  . temazepam  30 mg Oral QHS    SUBJECTIVE: Patient states that he has been feeling better and demonstrates less confusion today. Denies subjective fevers, chills, abdominal pain, chest pain, shortness of breath.   Review  of Systems: Review of Systems  All other systems reviewed and are negative.  No Known Allergies  OBJECTIVE: Vitals:   09/14/16 0549 09/14/16 0840 09/14/16 1003 09/14/16 1457  BP: 117/67 119/76 125/73 (!) 146/73  Pulse: 88 (!) 110 100 94  Resp: 18 16 20 20   Temp: 98.6 F (37 C) (!) 97.4 F (36.3 C) (!) 97.5 F (36.4 C) (!) 97.5 F (36.4 C)  TempSrc: Oral Oral Oral Oral  SpO2: 100% 100% 98% 100%  Weight:      Height:       Body mass index is 23.87 kg/m.  Physical Exam  Constitutional: He is well-developed, well-nourished, and in no distress.  Eyes: Pupils are equal, round, and reactive to light. Conjunctivae are normal. No scleral icterus.  Cardiovascular: Normal rate, regular rhythm and normal heart sounds.   No murmur heard. Pulmonary/Chest: Effort normal and breath sounds normal.  Abdominal: Soft. Bowel sounds are normal. He exhibits no distension. There is no tenderness.  Musculoskeletal: He exhibits no edema.  Neurological: He is alert.  Skin: Skin is warm and dry. No rash noted. No erythema.    Lab Results Lab Results  Component Value Date   WBC 13.1 (H) 09/14/2016   HGB 9.7 (L) 09/14/2016   HCT 29.3 (L) 09/14/2016   MCV 96.1 09/14/2016   PLT 395 09/14/2016    Lab Results  Component Value Date   CREATININE 0.89  09/14/2016   BUN 20 09/14/2016   NA 132 (L) 09/14/2016   K 3.5 09/14/2016   CL 101 09/14/2016   CO2 25 09/14/2016    Lab Results  Component Value Date   ALT 31 09/09/2016   AST 41 09/09/2016   ALKPHOS 72 09/09/2016   BILITOT 1.0 09/09/2016     Microbiology: Recent Results (from the past 240 hour(s))  Culture, blood (routine x 2)     Status: None   Collection Time: 09/09/16 12:20 AM  Result Value Ref Range Status   Specimen Description BLOOD RIGHT HAND  Final   Special Requests   Final    BOTTLES DRAWN AEROBIC AND ANAEROBIC Blood Culture adequate volume   Culture NO GROWTH 5 DAYS  Final   Report Status 09/14/2016 FINAL  Final    Culture, blood (routine x 2)     Status: None   Collection Time: 09/09/16 12:25 AM  Result Value Ref Range Status   Specimen Description BLOOD RIGHT WRIST  Final   Special Requests   Final    BOTTLES DRAWN AEROBIC AND ANAEROBIC Blood Culture adequate volume   Culture NO GROWTH 5 DAYS  Final   Report Status 09/14/2016 FINAL  Final  Body fluid culture     Status: None   Collection Time: 09/09/16 12:49 PM  Result Value Ref Range Status   Specimen Description FLUID RIGHT HIP  Final   Special Requests Normal  Final   Gram Stain   Final    MODERATE WBC PRESENT, PREDOMINANTLY PMN MODERATE GRAM POSITIVE COCCI IN PAIRS IN CLUSTERS    Culture MODERATE STAPHYLOCOCCUS AUREUS  Final   Report Status 09/12/2016 FINAL  Final   Organism ID, Bacteria STAPHYLOCOCCUS AUREUS  Final      Susceptibility   Staphylococcus aureus - MIC*    CIPROFLOXACIN <=0.5 SENSITIVE Sensitive     ERYTHROMYCIN <=0.25 SENSITIVE Sensitive     GENTAMICIN <=0.5 SENSITIVE Sensitive     OXACILLIN <=0.25 SENSITIVE Sensitive     TETRACYCLINE <=1 SENSITIVE Sensitive     VANCOMYCIN <=0.5 SENSITIVE Sensitive     TRIMETH/SULFA <=10 SENSITIVE Sensitive     CLINDAMYCIN <=0.25 SENSITIVE Sensitive     RIFAMPIN <=0.5 SENSITIVE Sensitive     Inducible Clindamycin NEGATIVE Sensitive     * MODERATE STAPHYLOCOCCUS AUREUS  Aerobic/Anaerobic Culture (surgical/deep wound)     Status: None   Collection Time: 09/09/16  2:17 PM  Result Value Ref Range Status   Specimen Description ABSCESS RIGHT PELVIS  Final   Special Requests Normal  Final   Gram Stain   Final    ABUNDANT WBC PRESENT, PREDOMINANTLY PMN ABUNDANT GRAM POSITIVE COCCI IN CLUSTERS    Culture   Final    ABUNDANT STAPHYLOCOCCUS AUREUS NO ANAEROBES ISOLATED    Report Status 09/14/2016 FINAL  Final   Organism ID, Bacteria STAPHYLOCOCCUS AUREUS  Final      Susceptibility   Staphylococcus aureus - MIC*    CIPROFLOXACIN <=0.5 SENSITIVE Sensitive     ERYTHROMYCIN <=0.25  SENSITIVE Sensitive     GENTAMICIN <=0.5 SENSITIVE Sensitive     OXACILLIN 0.5 SENSITIVE Sensitive     TETRACYCLINE <=1 SENSITIVE Sensitive     VANCOMYCIN <=0.5 SENSITIVE Sensitive     TRIMETH/SULFA <=10 SENSITIVE Sensitive     CLINDAMYCIN <=0.25 SENSITIVE Sensitive     RIFAMPIN <=0.5 SENSITIVE Sensitive     Inducible Clindamycin NEGATIVE Sensitive     * ABUNDANT STAPHYLOCOCCUS AUREUS  Surgical pcr screen  Status: None   Collection Time: 09/12/16  3:47 PM  Result Value Ref Range Status   MRSA, PCR NEGATIVE NEGATIVE Final   Staphylococcus aureus NEGATIVE NEGATIVE Final    Comment:        The Xpert SA Assay (FDA approved for NASAL specimens in patients over 61 years of age), is one component of a comprehensive surveillance program.  Test performance has been validated by [facility] for patients greater than or equal to 53 year old. It is not intended to diagnose infection nor to guide or monitor treatment.   Aerobic/Anaerobic Culture (surgical/deep wound)     Status: None (Preliminary result)   Collection Time: 09/12/16  6:26 PM  Result Value Ref Range Status   Specimen Description ABSCESS RIGHT HIP  Final   Special Requests NONE  Final   Gram Stain   Final    ABUNDANT WBC PRESENT, PREDOMINANTLY PMN FEW GRAM POSITIVE COCCI IN CLUSTERS    Culture CULTURE REINCUBATED FOR BETTER GROWTH  Final   Report Status PENDING  Incomplete    Will Kendall West, [place] 09/14/2016, 5:00 PM

## 2016-09-14 NOTE — Progress Notes (Signed)
Referring Physician(s):  Dr. Debbe Odea  Supervising Physician: Marybelle Killings  Patient Status:  [facility] - In-pt  Chief Complaint:  Right iliacus abscess s/p drain placement 7/28 by Dr. Vernard Gambles.   Subjective: Patient resting this morning.  More quiet and reserved than yesterday. No complaints.   Allergies: Patient has no known allergies.  Medications: Prior to Admission medications   Medication Sig Start Date End Date Taking? Authorizing Provider  celecoxib (CELEBREX) 200 MG capsule Take 200 mg by mouth daily.  08/19/16  Yes [provider]  diazepam (VALIUM) 5 MG tablet Take 1 tablet (5 mg total) by mouth every 8 (eight) hours as needed for muscle spasms. 09/03/16  Yes Virgel Manifold, MD  metoprolol succinate (TOPROL-XL) 50 MG 24 hr tablet Take 50 mg by mouth daily.    Yes [provider]  naproxen sodium (ANAPROX) 220 MG tablet Take 440 mg by mouth daily as needed (pain).   Yes [provider]  oxyCODONE-acetaminophen (PERCOCET/ROXICET) 5-325 MG tablet Take 1-2 tablets by mouth every 6 (six) hours as needed for severe pain. Patient taking differently: Take 1 tablet by mouth every 8 (eight) hours as needed for severe pain.  09/03/16  Yes Virgel Manifold, MD  simvastatin (ZOCOR) 20 MG tablet Take 20 mg by mouth daily.   Yes [provider]  temazepam (RESTORIL) 30 MG capsule Take 30 mg by mouth at bedtime.  08/24/16  Yes [provider]  tiZANidine (ZANAFLEX) 4 MG tablet Take 1 tablet (4 mg total) by mouth every 8 (eight) hours as needed for muscle spasms. 08/30/16  Yes Mcarthur Rossetti, MD  traMADol (ULTRAM) 50 MG tablet TAKE 1 TO 2 TABLETS BY MOUTH TWICE A DAY AS NEEDED FOR PAIN. Patient taking differently: TAKE 50MG -100MG  BY MOUTH EVERY 8 HOURS AS NEEDED FOR PAIN. 09/08/16  Yes Mcarthur Rossetti, MD  HYDROcodone-acetaminophen (NORCO) 10-325 MG tablet TAKE 1 TABLET BY MOUTH EVERY 6 HOURS AS NEEDED. 09/11/16   Mcarthur Rossetti, MD      Vital Signs: BP 119/76 (BP Location: Left Arm)   Pulse (!) 110   Temp (!) 97.4 F (36.3 C) (Oral) Comment: pt drinking cold water  Resp 16   Ht 6\' 1"  (1.854 m)   Wt 180 lb 14.4 oz (82.1 kg)   SpO2 100%   BMI 23.87 kg/m   Physical Exam  Constitutional: He appears well-developed.  Cardiovascular: Normal rate, regular rhythm and normal heart sounds.   Pulmonary/Chest: Effort normal and breath sounds normal. No respiratory distress.  Musculoskeletal:  Pelvic drain in place with bloody output.  Insertion site intact.  Nontender to palpation.   Nursing note and vitals reviewed.   NAD, alert MSK: right pelvic abscess drain in place with bloody output.  Insertion site with dry blood, but appears intact and without erythema or warmth.   Imaging: Dg Hip Unilat With Pelvis 2-3 Views Right  Result Date: 09/11/2016 CLINICAL DATA:  Right hip pain.  No known injury. EXAM: DG HIP (WITH OR WITHOUT PELVIS) 2-3V RIGHT COMPARISON:  CT pelvis for abscess drain catheter placement 09/09/2016. CT pelvis 09/08/2016 FINDINGS: Postoperative left hip hemiarthroplasty with non cemented components. Components appear well seated. No evidence of acute fracture or dislocation in the pelvis or right hip. Pigtail drainage catheter projected over the right pelvis. SI joints and symphysis pubis are not displaced. Left hip demonstrates degenerative changes. IMPRESSION: Right hip hemiarthroplasty appears well seated. No acute fracture or dislocation. Drainage catheter projected over the right pelvis.  Electronically Signed   By: Lucienne Capers M.D.   On: 09/11/2016 23:50    Labs:  CBC:  Recent Labs  09/11/16 0334 09/12/16 0302 09/13/16 0752 09/14/16 0355  WBC 12.4* 11.9* 12.6* 13.1*  HGB 12.0* 12.5* 10.7* 9.7*  HCT 36.6* 37.2* 32.0* 29.3*  PLT 325 361 382 395    COAGS: No results for input(s): INR, APTT in the last 8760 hours.  BMP:  Recent Labs  09/11/16 0334 09/12/16 0302 09/13/16 0752  09/14/16 0355  NA 136 130* 130* 132*  K 3.3* 3.8 4.0 3.5  CL 97* 97* 98* 101  CO2 30 25 23 25   GLUCOSE 120* 117* 119* 106*  BUN 22* 20 17 20   CALCIUM 8.7* 8.5* 8.0* 8.1*  CREATININE 0.92 0.84 0.75 0.89  GFRNONAA >60 >60 >60 >60  GFRAA >60 >60 >60 >60    LIVER FUNCTION TESTS:  Recent Labs  09/08/16 1926 09/09/16 0359  BILITOT 1.2 1.0  AST 47* 41  ALT 35 31  ALKPHOS 73 72  PROT 6.9 6.6  ALBUMIN 2.7* 2.3*    Assessment and Plan: Right iliacus abscess s/p pelvic drain placement 7/28 by Dr. Fabio Bering remains in place with bloody output.  WBC elevated to 13.1 today Will request RN record output from drain.  Will follow output. Continue drain care.   Electronically Signed: Docia Barrier, PA 09/14/2016, 9:45 AM   I spent a total of 15 Minutes at the the patient's bedside AND on the patient's hospital floor or unit, greater than 50% of which was counseling/coordinating care for iliacus abscess.

## 2016-09-14 NOTE — Progress Notes (Signed)
CSW met with patient to discuss possible SNF placement, as a back-up to CIR recommendation and placement. Patient is not interested in SNF placement at this time; would want to go home instead of SNF.  CSW will sign off.  Laveda Abbe, [place] Clinical Social Worker [phone]

## 2016-09-14 NOTE — Consult Note (Signed)
Physical Medicine and Rehabilitation Consult  Reason for Consult: Right hip infection with sepsis Referring Physician: Dr. Wyline Copas   HPI: Jon Choi is a 66 y.o. male  history of lumbar spondylosis treated with ESI/facet injections who was admitted via ED on 09/08/16  with complaints of progressive back pain with BLE weakness with instability, difficulty walking, fevers and confusion. He was found to be septic with acute renal failure and he was started on IVF and  Dr. Ronnald Ramp consulted and X rays done revealing sever canal stenosis L4/5 with large extruded disc, subtle compression fracture L5 and hypodense mass within right iliacs and edema right psoas. NS recommended aspiration of hip fluid prior to starting antibiotics. This was performed on 7/28 and drain placed.   MRI lumbar spine done showing multiple fluid collections with question of large right iliacs hematoma and question metastases or abscess.   Fluid cultures was positive for staph aureus and ID recommended 6-8 weeks of cefazolin for. Treatment. Dr. Alvan Dame consulted for input and patient underwent I and D of right hip with placement of antibiotic spacer on 7/31. Post op to be PWB RLE with ASA bid for DVT prophylaxis. Therapy evaluations done revealing deficits in mobility and self care tasks. CIR recommended by MD and rehab team.  Patient was independent and working PTA. He would prefer [facility] therapy and reports wife can take 2 weeks off to assist after discharge. Also reports that he has a  large network of friends who can assist after discharge.   Patient states that food does not taste right to him when he is in the hospital. Even if his family brings in food. He wants to go home as soon as possible and states that his son and wife can help take care of him.   Review of Systems  HENT: Positive for hearing loss. Negative for tinnitus.   Eyes: Negative for blurred vision and double vision.  Respiratory: Negative for cough and  shortness of breath.   Cardiovascular: Negative for chest pain, palpitations and leg swelling.  Gastrointestinal: Positive for heartburn. Negative for constipation.  Musculoskeletal: Positive for back pain and joint pain. Negative for myalgias and neck pain.  Neurological: Positive for focal weakness. Negative for dizziness and headaches.  Psychiatric/Behavioral: The patient does not have insomnia.      Past Medical History:  Diagnosis Date  . Back pain   . Melanoma ([place]) 2010   facial--multiple excision with plastic surgery    Past Surgical History:  Procedure Laterality Date  . TOTAL HIP ARTHROPLASTY Right 09/12/2016   Procedure: IRRIGATION AND DEBRIDEMENT HIP WITH FEMORAL HEAD AND ACETABULAR CUP EXCHANGE;  Surgeon: [name], MD;  Location: [place];  Service: Orthopedics;  Laterality: Right;  . TOTAL HIP ARTHROPLASTY Right 2007    Family History  Problem Relation Age of Onset  . Hypertension Other     Social History:  Married.  Wife works.  Independent PTA--works a an Agricultural consultant for [employer].  He used to smoke cigarettes --smoked for 40 years 1.5 packs per day.  He reports that he has quit smoking 3 years ago--now vapes.  He has never used smokeless tobacco. He reports that he drinks alcohol a couple of times a week.  He reports that he does not use drugs.   Allergies: No Known Allergies    Medications Prior to Admission  Medication Sig Dispense Refill  . celecoxib (CELEBREX) 200 MG capsule Take 200 mg by mouth daily.   6  .  diazepam (VALIUM) 5 MG tablet Take 1 tablet (5 mg total) by mouth every 8 (eight) hours as needed for muscle spasms. 6 tablet 0  . metoprolol succinate (TOPROL-XL) 50 MG 24 hr tablet Take 50 mg by mouth daily.     . naproxen sodium (ANAPROX) 220 MG tablet Take 440 mg by mouth daily as needed (pain).    Marland Kitchen oxyCODONE-acetaminophen (PERCOCET/ROXICET) 5-325 MG tablet Take 1-2 tablets by mouth every 6 (six) hours as needed for severe pain. (Patient taking  differently: Take 1 tablet by mouth every 8 (eight) hours as needed for severe pain. ) 10 tablet 0  . simvastatin (ZOCOR) 20 MG tablet Take 20 mg by mouth daily.    . temazepam (RESTORIL) 30 MG capsule Take 30 mg by mouth at bedtime.   0  . tiZANidine (ZANAFLEX) 4 MG tablet Take 1 tablet (4 mg total) by mouth every 8 (eight) hours as needed for muscle spasms. 60 tablet 0  . traMADol (ULTRAM) 50 MG tablet TAKE 1 TO 2 TABLETS BY MOUTH TWICE A DAY AS NEEDED FOR PAIN. (Patient taking differently: TAKE 50MG -100MG  BY MOUTH EVERY 8 HOURS AS NEEDED FOR PAIN.) 60 tablet 0    Home: Home Living Family/patient expects to be discharged to:: Inpatient rehab Living Arrangements: Spouse/significant other Available Help at Discharge: Family Type of Home: House Home Access: Stairs to enter Technical brewer of Steps: 3 Entrance Stairs-Rails: Right Home Layout: One level Bathroom Shower/Tub: Chiropodist: Standard Bathroom Accessibility: Yes Home Equipment: Environmental consultant - 2 wheels, Cane - single point  Functional History: Prior Function Level of Independence: Independent Functional Status:  Mobility: Bed Mobility Overal bed mobility: Needs Assistance Bed Mobility: Supine to Sit Supine to sit: Max assist General bed mobility comments: Pt attempting to use trapeze bar but unable to move BLE off bed Required Max A to move to EOB Transfers Overall transfer level: Needs assistance Transfers: Sit to/from Stand, Stand Pivot Transfers Sit to Stand: +2 physical assistance, Mod assist Stand pivot transfers: Min assist, +2 safety/equipment General transfer comment: continued vc for sequecing. Pt appeared to be maintining PWB status      ADL: ADL Overall ADL's : Needs assistance/impaired Eating/Feeding: Set up Grooming: Set up, Oral care, Wash/dry hands, Wash/dry face, Sitting Upper Body Bathing: Supervision/ safety, Set up, Sitting Lower Body Bathing: Moderate assistance, Sit  to/from stand Upper Body Dressing : Supervision/safety, Set up, Sitting Lower Body Dressing: Maximal assistance, Sit to/from stand Functional mobility during ADLs: Moderate assistance, +2 for physical assistance, Rolling walker, Cueing for safety, Cueing for sequencing  Cognition: Cognition Overall Cognitive Status: Impaired/Different from baseline Orientation Level: Oriented X4 Cognition Arousal/Alertness: Awake/alert Behavior During Therapy: Restless Overall Cognitive Status: Impaired/Different from baseline Area of Impairment: Orientation, Attention, Memory, Following commands, Safety/judgement, Awareness, Problem solving Orientation Level: Disoriented to, Time Current Attention Level: Sustained Memory: Decreased recall of precautions, Decreased short-term memory Following Commands: Follows one step commands with increased time Safety/Judgement: Decreased awareness of safety, Decreased awareness of deficits Awareness: Emergent Problem Solving: Slow processing, Decreased initiation, Difficulty sequencing General Comments: Pt required Mod A + 2 with mobility and thinks he can go home tomorrow. Unable to recall hip precuations from yesterdays session. Perseverating on topics during session   Blood pressure 125/73, pulse 100, temperature (!) 97.5 F (36.4 C), temperature source Oral, resp. rate 20, height 6\' 1"  (1.854 m), weight 82.1 kg (180 lb 14.4 oz), SpO2 98 %. Physical Exam  Nursing note and vitals reviewed. Constitutional: He is oriented to person,  place, and time. He appears well-developed and well-nourished. No distress.  HENT:  Head: Normocephalic and atraumatic.  Mouth/Throat: Oropharynx is clear and moist.  Eyes: Pupils are equal, round, and reactive to light. Conjunctivae and EOM are normal.  Neck: Normal range of motion. Neck supple.  Cardiovascular: Normal rate and regular rhythm.   Respiratory: Effort normal and breath sounds normal. No stridor. No respiratory  distress. He has no wheezes.  GI: Soft. Bowel sounds are normal. He exhibits no distension. There is no tenderness.  Musculoskeletal: He exhibits no edema.  Well healed incisions bilateral knees. Right lateral knee with erythema and warmth--no swelling or tenderness. Right hip with bloody fluid in drain.   Neurological: He is alert and oriented to person, place, and time.  Speech clear. Able to follow commands without difficulty.   Skin: Skin is warm and dry. He is not diaphoretic.  Psychiatric: He has a normal mood and affect. His behavior is normal. Thought content normal.    Results for orders placed or performed during the hospital encounter of 09/08/16 (from the past 24 hour(s))  Glucose, capillary     Status: Abnormal   Collection Time: 09/13/16  5:10 PM  Result Value Ref Range   Glucose-Capillary 132 (H) 65 - 99 mg/dL   Comment 1 Notify RN    Comment 2 Document in Chart   Glucose, capillary     Status: Abnormal   Collection Time: 09/13/16  9:14 PM  Result Value Ref Range   Glucose-Capillary 111 (H) 65 - 99 mg/dL   Comment 1 Notify RN    Comment 2 Document in Chart   Basic metabolic panel     Status: Abnormal   Collection Time: 09/14/16  3:55 AM  Result Value Ref Range   Sodium 132 (L) 135 - 145 mmol/L   Potassium 3.5 3.5 - 5.1 mmol/L   Chloride 101 101 - 111 mmol/L   CO2 25 22 - 32 mmol/L   Glucose, Bld 106 (H) 65 - 99 mg/dL   BUN 20 6 - 20 mg/dL   Creatinine, Ser 0.89 0.61 - 1.24 mg/dL   Calcium 8.1 (L) 8.9 - 10.3 mg/dL   GFR calc non Af Amer >60 >60 mL/min   GFR calc Af Amer >60 >60 mL/min   Anion gap 6 5 - 15  CBC     Status: Abnormal   Collection Time: 09/14/16  3:55 AM  Result Value Ref Range   WBC 13.1 (H) 4.0 - 10.5 K/uL   RBC 3.05 (L) 4.22 - 5.81 MIL/uL   Hemoglobin 9.7 (L) 13.0 - 17.0 g/dL   HCT 29.3 (L) 39.0 - 52.0 %   MCV 96.1 78.0 - 100.0 fL   MCH 31.8 26.0 - 34.0 pg   MCHC 33.1 30.0 - 36.0 g/dL   RDW 13.0 11.5 - 15.5 %   Platelets 395 150 - 400 K/uL   Sedimentation rate     Status: Abnormal   Collection Time: 09/14/16  3:55 AM  Result Value Ref Range   Sed Rate 104 (H) 0 - 16 mm/hr  C-reactive protein     Status: Abnormal   Collection Time: 09/14/16  3:55 AM  Result Value Ref Range   CRP 20.1 (H) <1.0 mg/dL  Glucose, capillary     Status: Abnormal   Collection Time: 09/14/16  6:18 AM  Result Value Ref Range   Glucose-Capillary 107 (H) 65 - 99 mg/dL   Comment 1 Notify RN    Comment 2 Document  in Chart   Glucose, capillary     Status: Abnormal   Collection Time: 09/14/16 11:48 AM  Result Value Ref Range   Glucose-Capillary 119 (H) 65 - 99 mg/dL   Comment 1 Notify RN    Comment 2 Document in Chart    No results found.  Assessment/Plan: Diagnosis: Right hip arthoplasty and iliopsoas infection with partial weightbearing right lower extremity 1. Does the need for close, 24 hr/day medical supervision in concert with the patient's rehab needs make it unreasonable for this patient to be served in a less intensive setting? Yes 2. Co-Morbidities requiring supervision/potential complications: Intra-articular and soft tissue infection requiring IV antibiotics, chronic low back pain with history of lumbar stenosis 3. Due to bladder management, bowel management, safety, skin/wound care, disease management, medication administration, pain management and patient education, does the patient require 24 hr/day rehab nursing? Yes 4. Does the patient require coordinated care of a physician, rehab nurse, PT (1-2 hrs/day, 5 days/week) and OT (1-2 hrs/day, 5 days/week) to address physical and functional deficits in the context of the above medical diagnosis(es)? Yes Addressing deficits in the following areas: balance, endurance, locomotion, strength, transferring, bowel/bladder control, bathing, dressing, feeding, grooming, toileting and psychosocial support 5. Can the patient actively participate in an intensive therapy program of at least 3 hrs of  therapy per day at least 5 days per week? Yes 6. The potential for patient to make measurable gains while on inpatient rehab is excellent 7. Anticipated functional outcomes upon discharge from inpatient rehab are modified independent and supervision  with PT, modified independent and supervision with OT, n/a with SLP. 8. Estimated rehab length of stay to reach the above functional goals is: 7-10 days 9. Anticipated D/C setting: Home 10. Anticipated post D/C treatments: [facility] therapy 11. Overall Rehab/Functional Prognosis: excellent  RECOMMENDATIONS: This patient's condition is appropriate for continued rehabilitative care in the following setting: CIR Patient has agreed to participate in recommended program. No Note that insurance prior authorization may be required for reimbursement for recommended care.  Comment: Patient is trying to arrange home discharge with family support. We will need home health for dressing changes as well as IV antibiotics and PT, OT  Charlett Blake M.D. [facility] FAAPM&R (Sports Med, Neuromuscular Med) Diplomate Am Board of Electrodiagnostic Med  Flora Lipps 09/14/2016

## 2016-09-14 NOTE — Progress Notes (Signed)
Physical Therapy Treatment Patient Details Name: Jon Choi MRN: [id number] DOB: 1951-02-10 Today's Date: 09/14/2016    History of Present Illness Jon Choi is a 66 y.o. male with a history of a hip replacement in 2007; hypertension, hyperlipidemia and back pain due to disc protrusion came in with worsening back pain and BLE numbness. He does typically get injections in the back at the L4-5 region due to pain. CT scan here noted a loculated abscess and a drain was placed on 7/28 with culture positive for MSSA.  He has had some intermittent confusion suspected to be from pain medications. Pt is s/p I&D of Right HIP WITH FEMORAL HEAD AND ACETABULAR CUP EXCHANGE.    PT Comments    Pt making steady progress towards achieving his current functional goals. Pt tolerated short distance ambulation this session and was able to maintain PWB R LE throughout. Pt remains limited secondary to cognitive deficits (see below) and pain despite being pre-medicated for session. Pt would continue to benefit from skilled physical therapy services at this time while admitted and after d/c to address the below listed limitations in order to improve overall safety and independence with functional mobility.    Follow Up Recommendations  CIR     Equipment Recommendations  None recommended by PT    Recommendations for Other Services Rehab consult     Precautions / Restrictions Precautions Precautions: Posterior Hip;Fall Precaution Booklet Issued: Yes (comment) Precaution Comments: Posterior hip precautions and PWB 50% Rt LE Restrictions Weight Bearing Restrictions: Yes RLE Weight Bearing: Partial weight bearing RLE Partial Weight Bearing Percentage or Pounds: 50%    Mobility  Bed Mobility Overal bed mobility: Needs Assistance Bed Mobility: Supine to Sit     Supine to sit: Max assist     General bed mobility comments: Pt attempting to use trapeze bar but unable to move BLE off bed Required Max  A to move to EOB  Transfers Overall transfer level: Needs assistance Equipment used: Rolling walker (2 wheeled) Transfers: Sit to/from [facility] Sit to Stand: +2 physical assistance;Mod assist Stand pivot transfers: Min assist;+2 safety/equipment       General transfer comment: pt required increased time and continued vc'ing for sequecing. Pt appeared to be maintining PWB status  Ambulation/Gait Ambulation/Gait assistance: Mod assist Ambulation Distance (Feet): 5 Feet Assistive device: Rolling walker (2 wheeled) Gait Pattern/deviations: Step-to pattern;Decreased step length - left;Decreased step length - right;Decreased stride length;Decreased weight shift to right;Decreased dorsiflexion - right;Trunk flexed Gait velocity: decreased Gait velocity interpretation: Below normal speed for age/gender General Gait Details: pt appearing to maintain PWB'ing R LE throughout, vc'ing required for sequencing with RW.    Stairs            Wheelchair Mobility    Modified Rankin (Stroke Patients Only)       Balance Overall balance assessment: Needs assistance Sitting-balance support: Feet unsupported Sitting balance-Leahy Scale: Fair     Standing balance support: Bilateral upper extremity supported;During functional activity Standing balance-Leahy Scale: Poor Standing balance comment: Heavy reliance on RW for UE support                             Cognition Arousal/Alertness: Awake/alert Behavior During Therapy: Restless Overall Cognitive Status: Impaired/Different from baseline Area of Impairment: Orientation;Attention;Memory;Following commands;Safety/judgement;Awareness;Problem solving                 Orientation Level: Disoriented to;Time Current Attention Level: Sustained Memory:  Decreased recall of precautions;Decreased short-term memory Following Commands: Follows one step commands with increased time Safety/Judgement: Decreased  awareness of safety;Decreased awareness of deficits Awareness: Emergent Problem Solving: Slow processing;Decreased initiation;Difficulty sequencing General Comments: Pt preseverating on various topics throughout session, requiring increased time and max cueing for redirection and attention to task. Pt believes he is safe to return home today or tomorrow even though he required physical assistance of two this session for mobility      Exercises      General Comments        Pertinent Vitals/Pain Pain Assessment: 0-10 Pain Score: 10-Worst pain ever Pain Location: Rt hip Pain Descriptors / Indicators: Discomfort;Grimacing Pain Intervention(s): Monitored during session;Repositioned;Premedicated before session    Home Living Family/patient expects to be discharged to:: Inpatient rehab Living Arrangements: Spouse/significant other Available Help at Discharge: Family Type of Home: House Home Access: Stairs to enter Entrance Stairs-Rails: Right Home Layout: One level Home Equipment: Environmental consultant - 2 wheels;Cane - single point      Prior Function Level of Independence: Independent          PT Goals (current goals can now be found in the care plan section) Acute Rehab PT Goals Patient Stated Goal: decrease pain  PT Goal Formulation: With patient Time For Goal Achievement: 09/27/16 Potential to Achieve Goals: Good Progress towards PT goals: Progressing toward goals    Frequency    Min 3X/week      PT Plan Current plan remains appropriate    Co-evaluation PT/OT/SLP Co-Evaluation/Treatment: Yes Reason for Co-Treatment: For patient/therapist safety;To address functional/ADL transfers PT goals addressed during session: Mobility/safety with mobility;Balance;Proper use of DME;Strengthening/ROM OT goals addressed during session: ADL's and self-care      AM-PAC PT "6 Clicks" Daily Activity  Outcome Measure  Difficulty turning over in bed (including adjusting bedclothes, sheets  and blankets)?: Total Difficulty moving from lying on back to sitting on the side of the bed? : Total Difficulty sitting down on and standing up from a chair with arms (e.g., wheelchair, bedside commode, etc,.)?: Total Help needed moving to and from a bed to chair (including a wheelchair)?: A Lot Help needed walking in hospital room?: A Lot Help needed climbing 3-5 steps with a railing? : A Lot 6 Click Score: 9    End of Session Equipment Utilized During Treatment: Gait belt Activity Tolerance: Patient limited by pain Patient left: in chair;with call bell/phone within reach;with chair alarm set Nurse Communication: Mobility status;Patient requests pain meds PT Visit Diagnosis: Unsteadiness on feet (R26.81);Other abnormalities of gait and mobility (R26.89);Muscle weakness (generalized) (M62.81);Difficulty in walking, not elsewhere classified (R26.2);Pain Pain - Right/Left: Right Pain - part of body: Hip     Time: 0930-1003 PT Time Calculation (min) (ACUTE ONLY): 33 min  Charges:  $Therapeutic Activity: 8-22 mins                    G Codes:       [place], [place], [place] 09/14/2016, 12:25 PM

## 2016-09-14 NOTE — Telephone Encounter (Signed)
Patient's wife Margarita Grizzle would like for Dr. Ninfa Linden to take over patients case.

## 2016-09-14 NOTE — Telephone Encounter (Signed)
Patient wife would like a call Dr. Ninfa Linden concerning her husband.  CB# is [phone].  Please advise.  Thank You.

## 2016-09-14 NOTE — Telephone Encounter (Signed)
Do me a favor and try to call her back and tell her to give you some details, tell her he is busy, booked, and was oncall

## 2016-09-14 NOTE — Progress Notes (Signed)
Rehab Admissions Coordinator Note:  Patient was screened by Retta Diones for appropriateness for an Inpatient Acute Rehab Consult. Noted PT/OT recommending CIR.  At this time, we are recommending Inpatient Rehab consult.  Jodell Cipro M 09/14/2016, 10:33 AM  I can be reached at [phone].

## 2016-09-14 NOTE — Progress Notes (Signed)
PHARMACY CONSULT NOTE FOR:  OUTPATIENT  PARENTERAL ANTIBIOTIC THERAPY (OPAT)  Indication: prosthetic joint infection with MSSA Regimen: ancef 2g IV Q 8 hrs End date: 9/10  IV antibiotic discharge orders are pended. To discharging provider:  please sign these orders via discharge navigator,  Select New Orders & click on the button choice - Manage This Unsigned Work.     Thank you for allowing pharmacy to be a part of this patient's care.  Jon Choi 09/14/2016, 9:33 PM

## 2016-09-14 NOTE — Telephone Encounter (Signed)
Patient states her husband is in the hospital, Dr. Alvan Dame did a recent I&D even after she told them we were her ortho doc. She wants you to take over his case, especially if a total hip replacement is warranted

## 2016-09-14 NOTE — NC FL2 (Signed)
[place] LEVEL OF CARE SCREENING TOOL     IDENTIFICATION  Patient Name: Jon Choi Birthdate: Aug 27, 1950 Sex: male Admission Date (Current Location): 09/08/2016  [facility] and [place] Number:  Herbalist and Address:  [facility] [place], [place] [address] [place], [place] [address]      Provider Number: [id number]  Attending Physician Name and Address:  Donne Hazel, MD  Relative Name and Phone Number:       Current Level of Care: Hospital Recommended Level of Care: [place] Prior Approval Number:    Date Approved/Denied:   PASRR Number: [id number]  Discharge Plan: SNF    Current Diagnoses: Patient Active Problem List   Diagnosis Date Noted  . Abscess   . Prosthetic hip infection ([place])   . MSSA (methicillin susceptible Staphylococcus aureus) infection   . 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 Sepsis ([place]) 09/09/2016  . 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-L5 level 09/09/2016  . ARF (acute renal failure) ([place]) 09/09/2016  . HLD (hyperlipidemia) 06/22/2015  . Hypertension 06/22/2015  . Malignant melanoma ([place]) 06/22/2015  . Gonalgia 07/01/2013    Orientation RESPIRATION BLADDER Height & Weight     Self, Time, Situation, Place  Normal Continent Weight: 180 lb 14.4 oz (82.1 kg) Height:  6\' 1"  (185.4 cm)  BEHAVIORAL SYMPTOMS/MOOD NEUROLOGICAL BOWEL NUTRITION STATUS      Continent Diet (see DC summary)  AMBULATORY STATUS COMMUNICATION OF NEEDS Skin   Extensive Assist Verbally Surgical wounds                       Personal Care Assistance Level of Assistance  Bathing, Dressing Bathing Assistance: Maximum assistance   Dressing Assistance: Maximum assistance     Functional Limitations Info             SPECIAL CARE FACTORS FREQUENCY  PT (By licensed PT), OT (By licensed OT)     PT Frequency: 5/wk OT Frequency: 5/wk            Contractures      Additional Factors Info  Code Status, Allergies  Code Status Info: FULL Allergies Info: NKA           Current Medications (09/14/2016):  This is the current hospital active medication list Current Facility-Administered Medications  Medication Dose Route Frequency Provider Last Rate Last Dose  . 0.9 %  sodium chloride infusion   Intravenous Continuous Debbe Odea, MD 100 mL/hr at 09/14/16 0523    . acetaminophen (TYLENOL) tablet 650 mg  650 mg Oral Q6H PRN Rise Patience, MD   650 mg at 09/14/16 0130   Or  . acetaminophen (TYLENOL) suppository 650 mg  650 mg Rectal Q6H PRN Rise Patience, MD      . calcium carbonate (TUMS - dosed in mg elemental calcium) chewable tablet 200 mg of elemental calcium  1 tablet Oral TID PRN Donne Hazel, MD   200 mg of elemental calcium at 09/13/16 1159  . ceFAZolin (ANCEF) IVPB 2g/100 mL premix  2 g Intravenous Q8H Tommy Medal, Lavell Islam, MD   Stopped at 09/14/16 [phone]  . chlorproMAZINE (THORAZINE) 25 mg in sodium chloride 0.9 % 25 mL IVPB  25 mg Intravenous Q6H PRN Debbe Odea, MD 50 mL/hr at 09/12/16 1053 25 mg at 09/12/16 1053  . ferrous sulfate tablet 325 mg  325 mg Oral TID PC Danae Orleans, PA-C   325 mg at 09/14/16 0841  . MEDLINE  mouth rinse  15 mL Mouth Rinse BID Rise Patience, MD   15 mL at 09/14/16 0841  . menthol-cetylpyridinium (CEPACOL) lozenge 3 mg  1 lozenge Oral PRN Danae Orleans, PA-C       Or  . phenol (CHLORASEPTIC) mouth spray 1 spray  1 spray Mouth/Throat PRN Babish, Matthew, PA-C      . methocarbamol (ROBAXIN) 500 mg in dextrose 5 % 50 mL IVPB  500 mg Intravenous Q6H PRN Rise Patience, MD      . metoCLOPramide (REGLAN) tablet 5-10 mg  5-10 mg Oral Q8H PRN Danae Orleans, PA-C       Or  . metoCLOPramide (REGLAN) injection 5-10 mg  5-10 mg Intravenous Q8H PRN Danae Orleans, PA-C      . metoprolol succinate (TOPROL-XL) 24 hr tablet 50 mg  50 mg Oral Daily Rise Patience, MD   50 mg at 09/14/16 0840  . morphine 2 MG/ML injection 2 mg  2 mg  Intravenous Q4H PRN Debbe Odea, MD   2 mg at 09/13/16 1630  . nicotine (NICODERM CQ - dosed in mg/24 hours) patch 14 mg  14 mg Transdermal Daily Debbe Odea, MD   14 mg at 09/14/16 0842  . ondansetron (ZOFRAN) tablet 4 mg  4 mg Oral Q6H PRN Rise Patience, MD       Or  . ondansetron [facility]) injection 4 mg  4 mg Intravenous Q6H PRN Rise Patience, MD   4 mg at 09/09/16 0155  . oxyCODONE (Oxy IR/ROXICODONE) immediate release tablet 5-10 mg  5-10 mg Oral Q4H PRN Debbe Odea, MD   10 mg at 09/14/16 0840  . pantoprazole (PROTONIX) EC tablet 40 mg  40 mg Oral Daily Donne Hazel, MD   40 mg at 09/14/16 0841  . simvastatin (ZOCOR) tablet 20 mg  20 mg Oral Daily Rise Patience, MD   20 mg at 09/13/16 1807  . sodium chloride flush (NS) 0.9 % injection 10-40 mL  10-40 mL Intracatheter PRN Donne Hazel, MD      . sodium chloride flush (NS) 0.9 % injection 5 mL  5 mL Intravenous Q8H Rizwan, Saima, MD   5 mL at 09/14/16 0600  . temazepam (RESTORIL) capsule 30 mg  30 mg Oral QHS Rise Patience, MD   30 mg at 09/13/16 2201     Discharge Medications: Please see discharge summary for a list of discharge medications.  Relevant Imaging Results:  Relevant Lab Results:   Additional Information SS#: [id number], patient will need 6-8 weeks IV ancef q8 hours  [place] Haydon, Connye Burkitt, LCSW

## 2016-09-14 NOTE — Progress Notes (Signed)
PROGRESS NOTE    Jon Choi  POE:[id number] DOB: Aug 16, 1950 DOA: 09/08/2016 PCP: [name], MD    Brief Narrative:  66 y.o.malewith history of hypertension, hyperlipidemia, melanoma, right hip replacement, chronic back pain  with L4-L5 disc protrusion and spondylolisthesis  was brought to the ER for increasing back pain with difficulty ambulating. Imaging reveals a right psoas abscess and severe stenosis of L4-L5 with compression fx at L5.  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[place])   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-L5 level   ARF (acute renal failure) ([place])   Abscess   Prosthetic hip infection ([place])   MSSA (methicillin susceptible Staphylococcus aureus) infection  Principal Problem:   Iliopsoas abscess- MSSA- right Hip aspirate showing the same    Sepsis - temp 100.3, WBC 14, HR in 100s, sepsis present on admission - lactic acid normal at 1.48 - IR guided drainage - drain still present -  Psoas abscess culture and right hip lavage culture showing MSSA - d/c  Vanc and Zosyn- started Ancef - blood cultures negative - ortho following for right prosthetic hip infection- patient underwent debridement of R hip and removal of R acetabulum shell on 7/31 - ID recs noted. Plan for 6 weeks of cefazolin through 9/10 - PICC was placed 8/1 - Discussed with ID - recommendation for repeat CT imaging in 1 month to ensure resolution  Active Problems: Acute encephalopathy - unfortunately is likely due to narcotics- wife noted it at home in relation to Hydrocodone - 7/29 d/c'd Fentanyl and Valium -  7/30- no complaints of pain and quite confused-  changed  Hydrocodone to Oxycodone 5-10 mg and d/c'd Morphine with plan to continue to wean narcotics-  today is is no longer confused but states he is in significant pain. Will resume Morphine which will likely make him confused again.  - attempting to wean Narcotics as tolerated    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-L5 level - neurosurgery notes that he is current not a candidate for surgery with an ongoing infectious process - Remains stable at present     ARF (acute renal failure)  - due to sepsis, dehydration - renal function normalized  Hyponatremia  - dehydration improved with IVF - repeat bmet in AM  Hypokalemia - labs reviewed - potassium within normal limits  Hiccoughs - ContinueThorazine as needed  Hypertension - Continue Metoprolol as tolerated    HLD (hyperlipidemia) - Continue with Zocor - Appears stable at present  Nicotine abuse - per wife, he "vapes"  - Continued on Nicotine patch  DVT prophylaxis: SCD's Code Status: Full Family Communication: Pt in room, family not at bedside Disposition Plan: Uncertain at this time  Consultants:   Orthopedic Surgery  IR  Infectious disease  Neurosurgery  Procedures:   R septic hip surgery 7/31  PICC placement 8/1  Antimicrobials: Anti-infectives    Start     Dose/Rate Route Frequency Ordered Stop   09/14/16 0200  ceFAZolin (ANCEF) IVPB 2g/100 mL premix     2 g 200 mL/hr over 30 Minutes Intravenous Every 8 hours 09/13/16 1914     09/13/16 0600  vancomycin (VANCOCIN) IVPB 1000 mg/200 mL premix  Status:  Discontinued     1,000 mg 200 mL/hr over 60 Minutes Intravenous On call to O.R. 09/12/16 1525 09/12/16 1900   09/12/16 1858  tobramycin (NEBCIN) powder  Status:  Discontinued       As needed 09/12/16 1858 09/12/16 1944   09/12/16 1857  vancomycin (VANCOCIN) powder  Status:  Discontinued       As needed 09/12/16 1857 09/12/16 1944   09/12/16 1730  ceFAZolin (ANCEF) IVPB 2g/100 mL premix     2 g 200 mL/hr over 30  Minutes Intravenous To ShortStay Surgical 09/12/16 1718 09/12/16 1815   09/12/16 1730  vancomycin (VANCOCIN) IVPB 1000 mg/200 mL premix  Status:  Discontinued     1,000 mg 200 mL/hr over 60 Minutes Intravenous To ShortStay Surgical 09/12/16 1719 09/12/16 2043   09/11/16 1800  ceFAZolin (ANCEF) IVPB 1 g/50 mL premix  Status:  Discontinued     1 g 100 mL/hr over 30 Minutes Intravenous Every 8 hours 09/11/16 1631 09/13/16 1914   09/09/16 1000  vancomycin (VANCOCIN) IVPB 750 mg/150 ml premix  Status:  Discontinued     750 mg 150 mL/hr over 60 Minutes Intravenous Every 12 hours 09/09/16 0108 09/09/16 0853   09/09/16 1000  vancomycin (VANCOCIN) IVPB 1000 mg/200 mL premix  Status:  Discontinued     1,000 mg 200 mL/hr over 60 Minutes Intravenous Every 12 hours 09/09/16 0853 09/11/16 1631   09/09/16 0400  piperacillin-tazobactam (ZOSYN) IVPB 3.375 g  Status:  Discontinued     3.375 g 12.5 mL/hr over 240 Minutes Intravenous Every 8 hours 09/09/16 0108 09/11/16 1631   09/08/16 2115  piperacillin-tazobactam (ZOSYN) IVPB 3.375 g     3.375 g 100 mL/hr over 30 Minutes Intravenous  Once 09/08/16 2107 09/08/16 2218   09/08/16 2115  vancomycin (VANCOCIN) IVPB 1000 mg/200 mL premix     1,000 mg 200 mL/hr over 60 Minutes Intravenous  Once 09/08/16 2107 09/08/16 2320      Subjective: Without complaints at present  Objective: Vitals:   09/14/16 0100 09/14/16 0549 09/14/16 0840 09/14/16 1003  BP: 132/68 117/67 119/76 125/73  Pulse: 97 88 (!) 110 100  Resp: 18 18 16 20   Temp: 99.2 F (37.3 C) 98.6 F (37 C) (!) 97.4 F (36.3 C) (!) 97.5 F (36.4 C)  TempSrc: Oral Oral Oral Oral  SpO2: 99% 100% 100% 98%  Weight:      Height:        Intake/Output Summary (Last 24 hours) at 09/14/16 1433 Last data filed at 09/14/16 0749  Gross per 24 hour  Intake           988.33 ml  Output              700 ml  Net           288.33 ml   Filed Weights   09/08/16 1527 09/09/16 0130  Weight: 76.2 kg (168 lb)  82.1 kg (180 lb 14.4 oz)    Examination: General exam: Awake, laying in bed, in nad Respiratory system: Normal respiratory effort, no wheezing Cardiovascular system: regular rate, s1, s2  Data Reviewed: I have personally reviewed following labs and imaging studies  CBC:  Recent Labs Lab 09/08/16 1926 09/09/16 0359 09/10/16 0326 09/11/16 0334 09/12/16 0302 09/13/16 0752 09/14/16 0355  WBC 14.7* 14.4* 13.0* 12.4* 11.9* 12.6* 13.1*  NEUTROABS 13.0* 12.9*  --   --   --   --   --   HGB 12.7* 11.9* 11.4* 12.0*  12.5* 10.7* 9.7*  HCT 37.3* 36.0* 34.5* 36.6* 37.2* 32.0* 29.3*  MCV 94.2 94.7 95.3 97.3 95.1 95.0 96.1  PLT 219 235 272 325 361 382 935   Basic Metabolic Panel:  Recent Labs Lab 09/10/16 0326 09/11/16 0334 09/12/16 0302 09/13/16 0752 09/14/16 0355  NA 134* 136 130* 130* 132*  K 3.7 3.3* 3.8 4.0 3.5  CL 99* 97* 97* 98* 101  CO2 27 30 25 23 25   GLUCOSE 148* 120* 117* 119* 106*  BUN 26* 22* 20 17 20   CREATININE 0.91 0.92 0.84 0.75 0.89  CALCIUM 8.6* 8.7* 8.5* 8.0* 8.1*   GFR: Estimated Creatinine Clearance: 96 mL/min (by C-G formula based on SCr of 0.89 mg/dL). Liver Function Tests:  Recent Labs Lab 09/08/16 1926 09/09/16 0359  AST 47* 41  ALT 35 31  ALKPHOS 73 72  BILITOT 1.2 1.0  PROT 6.9 6.6  ALBUMIN 2.7* 2.3*   No results for input(s): LIPASE, AMYLASE in the last 168 hours. No results for input(s): AMMONIA in the last 168 hours. Coagulation Profile: No results for input(s): INR, PROTIME in the last 168 hours. Cardiac Enzymes: No results for input(s): CKTOTAL, CKMB, CKMBINDEX, TROPONINI in the last 168 hours. BNP (last 3 results) No results for input(s): PROBNP in the last 8760 hours. HbA1C: No results for input(s): HGBA1C in the last 72 hours. CBG:  Recent Labs Lab 09/13/16 1159 09/13/16 1710 09/13/16 2114 09/14/16 0618 09/14/16 1148  GLUCAP 128* 132* 111* 107* 119*   Lipid Profile: No results for input(s): CHOL, HDL, LDLCALC, TRIG,  CHOLHDL, LDLDIRECT in the last 72 hours. Thyroid Function Tests: No results for input(s): TSH, T4TOTAL, FREET4, T3FREE, THYROIDAB in the last 72 hours. Anemia Panel: No results for input(s): VITAMINB12, FOLATE, FERRITIN, TIBC, IRON, RETICCTPCT in the last 72 hours. Sepsis Labs:  Recent Labs Lab 09/08/16 1936 09/09/16 0359  PROCALCITON  --  1.60  LATICACIDVEN 1.48  --     Recent Results (from the past 240 hour(s))  Culture, blood (routine x 2)     Status: None (Preliminary result)   Collection Time: 09/09/16 12:20 AM  Result Value Ref Range Status   Specimen Description BLOOD RIGHT HAND  Final   Special Requests   Final    BOTTLES DRAWN AEROBIC AND ANAEROBIC Blood Culture adequate volume   Culture NO GROWTH 4 DAYS  Final   Report Status PENDING  Incomplete  Culture, blood (routine x 2)     Status: None (Preliminary result)   Collection Time: 09/09/16 12:25 AM  Result Value Ref Range Status   Specimen Description BLOOD RIGHT WRIST  Final   Special Requests   Final    BOTTLES DRAWN AEROBIC AND ANAEROBIC Blood Culture adequate volume   Culture NO GROWTH 4 DAYS  Final   Report Status PENDING  Incomplete  Body fluid culture     Status: None   Collection Time: 09/09/16 12:49 PM  Result Value Ref Range Status   Specimen Description FLUID RIGHT HIP  Final   Special Requests Normal  Final   Gram Stain   Final    MODERATE WBC PRESENT, PREDOMINANTLY PMN MODERATE GRAM POSITIVE COCCI IN PAIRS IN CLUSTERS    Culture MODERATE STAPHYLOCOCCUS AUREUS  Final   Report Status 09/12/2016 FINAL  Final   Organism ID, Bacteria STAPHYLOCOCCUS AUREUS  Final      Susceptibility   Staphylococcus aureus - MIC*    CIPROFLOXACIN <=0.5 SENSITIVE Sensitive     ERYTHROMYCIN <=0.25 SENSITIVE Sensitive  GENTAMICIN <=0.5 SENSITIVE Sensitive     OXACILLIN <=0.25 SENSITIVE Sensitive     TETRACYCLINE <=1 SENSITIVE Sensitive     VANCOMYCIN <=0.5 SENSITIVE Sensitive     TRIMETH/SULFA <=10 SENSITIVE  Sensitive     CLINDAMYCIN <=0.25 SENSITIVE Sensitive     RIFAMPIN <=0.5 SENSITIVE Sensitive     Inducible Clindamycin NEGATIVE Sensitive     * MODERATE STAPHYLOCOCCUS AUREUS  Aerobic/Anaerobic Culture (surgical/deep wound)     Status: None   Collection Time: 09/09/16  2:17 PM  Result Value Ref Range Status   Specimen Description ABSCESS RIGHT PELVIS  Final   Special Requests Normal  Final   Gram Stain   Final    ABUNDANT WBC PRESENT, PREDOMINANTLY PMN ABUNDANT GRAM POSITIVE COCCI IN CLUSTERS    Culture   Final    ABUNDANT STAPHYLOCOCCUS AUREUS NO ANAEROBES ISOLATED    Report Status 09/14/2016 FINAL  Final   Organism ID, Bacteria STAPHYLOCOCCUS AUREUS  Final      Susceptibility   Staphylococcus aureus - MIC*    CIPROFLOXACIN <=0.5 SENSITIVE Sensitive     ERYTHROMYCIN <=0.25 SENSITIVE Sensitive     GENTAMICIN <=0.5 SENSITIVE Sensitive     OXACILLIN 0.5 SENSITIVE Sensitive     TETRACYCLINE <=1 SENSITIVE Sensitive     VANCOMYCIN <=0.5 SENSITIVE Sensitive     TRIMETH/SULFA <=10 SENSITIVE Sensitive     CLINDAMYCIN <=0.25 SENSITIVE Sensitive     RIFAMPIN <=0.5 SENSITIVE Sensitive     Inducible Clindamycin NEGATIVE Sensitive     * ABUNDANT STAPHYLOCOCCUS AUREUS  Surgical pcr screen     Status: None   Collection Time: 09/12/16  3:47 PM  Result Value Ref Range Status   MRSA, PCR NEGATIVE NEGATIVE Final   Staphylococcus aureus NEGATIVE NEGATIVE Final    Comment:        The Xpert SA Assay (FDA approved for NASAL specimens in patients over 26 years of age), is one component of a comprehensive surveillance program.  Test performance has been validated by [facility] for patients greater than or equal to 18 year old. It is not intended to diagnose infection nor to guide or monitor treatment.   Aerobic/Anaerobic Culture (surgical/deep wound)     Status: None (Preliminary result)   Collection Time: 09/12/16  6:26 PM  Result Value Ref Range Status   Specimen Description ABSCESS  RIGHT HIP  Final   Special Requests NONE  Final   Gram Stain   Final    ABUNDANT WBC PRESENT, PREDOMINANTLY PMN FEW GRAM POSITIVE COCCI IN CLUSTERS    Culture CULTURE REINCUBATED FOR BETTER GROWTH  Final   Report Status PENDING  Incomplete     Radiology Studies: No results found.  Scheduled Meds: . ferrous sulfate  325 mg Oral TID PC  . mouth rinse  15 mL Mouth Rinse BID  . metoprolol succinate  50 mg Oral Daily  . nicotine  14 mg Transdermal Daily  . pantoprazole  40 mg Oral Daily  . simvastatin  20 mg Oral Daily  . sodium chloride flush  5 mL Intravenous Q8H  . temazepam  30 mg Oral QHS   Continuous Infusions: . sodium chloride 100 mL/hr at 09/14/16 0523  .  ceFAZolin (ANCEF) IV Stopped (09/14/16 8119)  . chlorproMAZINE (THORAZINE) IV 25 mg (09/12/16 1053)  . methocarbamol (ROBAXIN)  IV       LOS: 5 days   Jon Choi, Orpah Melter, MD [facility] Pager [phone]  If 7PM-7AM, please contact night-coverage [email or web address] Password [facility] 09/14/2016,  2:33 PM

## 2016-09-14 NOTE — Progress Notes (Signed)
Occupational Therapy Evaluation Patient Details Name: Jon Choi MRN: [id number] DOB: May 30, 1950 Today's Date: 09/14/2016    History of Present Illness Jon Choi is a 66 y.o. male with a history of a hip replacement in 2007; hypertension, hyperlipidemia and back pain due to disc protrusion came in with worsening back pain and BLE numbness. He does typically get injections in the back at the L4-5 region due to pain. CT scan here noted a loculated abscess and a drain was placed on 7/28 with culture positive for MSSA.  He has had some intermittent confusion suspected to be from pain medications. Pt is s/p I&D of Right HIP WITH FEMORAL HEAD AND ACETABULAR CUP EXCHANGE.   Clinical Impression   PTA, pt lived with his wife and was independent with ADL and mobility. Pt demonstrates a significant change in functional status and will benefit form rehab at [facility] to maximize functional level of independence and facilitate safe DC home. Pt required +2 Mod A with mobility with RW and Max A with LB ADL. Able to ambulate @ short distance today. Apparent cognitive deficits as noted below. Will follow acutely to address established goals.     Follow Up Recommendations  [facility];Supervision/Assistance - 24 hour    Equipment Recommendations  Other (comment) (will further assess)    Recommendations for Other Services Rehab consult     Precautions / Restrictions Precautions Precautions: Posterior Hip;Fall Precaution Booklet Issued: Yes (comment) Precaution Comments: Posterior hip precautions and PWB 50% Rt LE Restrictions Weight Bearing Restrictions: Yes RLE Weight Bearing: Partial weight bearing RLE Partial Weight Bearing Percentage or Pounds: 50%      Mobility Bed Mobility Overal bed mobility: Needs Assistance Bed Mobility: Supine to Sit     Supine to sit: Max assist     General bed mobility comments: Pt attempting to use trapeze bar but unable to move BLE off bed Required Max A to move to  EOB  Transfers Overall transfer level: Needs assistance   Transfers: Sit to/from Stand;Stand Pivot Transfers Sit to Stand: +2 physical assistance;Mod assist Stand pivot transfers: Min assist;+2 safety/equipment       General transfer comment: continued vc for sequecing. Pt appeared to be maintining PWB status    Balance Overall balance assessment: Needs assistance Sitting-balance support: Bilateral upper extremity supported;Feet unsupported Sitting balance-Leahy Scale: Fair     Standing balance support: Bilateral upper extremity supported;During functional activity Standing balance-Leahy Scale: Poor Standing balance comment: Heavy reliance on RW for UE support                            ADL either performed or assessed with clinical judgement   ADL Overall ADL's : Needs assistance/impaired Eating/Feeding: Set up   Grooming: Set up;Oral care;Wash/dry hands;Wash/dry face;Sitting   Upper Body Bathing: Supervision/ safety;Set up;Sitting   Lower Body Bathing: Moderate assistance;Sit to/from stand   Upper Body Dressing : Supervision/safety;Set up;Sitting   Lower Body Dressing: Maximal assistance;Sit to/from stand               Functional mobility during ADLs: Moderate assistance;+2 for physical assistance;Rolling walker;Cueing for safety;Cueing for sequencing       Vision Baseline Vision/History: Wears glasses Wears Glasses: Reading only       Perception     Praxis      Pertinent Vitals/Pain Pain Assessment: 0-10 Pain Score: 10-Worst pain ever Pain Location: Rt hip Pain Descriptors / Indicators: Discomfort;Grimacing Pain Intervention(s): Limited activity within patient's  tolerance;Repositioned     Hand Dominance Right   Extremity/Trunk Assessment Upper Extremity Assessment Upper Extremity Assessment: Overall WFL for tasks assessed   Lower Extremity Assessment Lower Extremity Assessment: Defer to PT evaluation RLE Deficits / Details:  weakness and limited mobility secondary to complications with THA and infection    Cervical / Trunk Assessment Cervical / Trunk Assessment: Normal   Communication Communication Communication: No difficulties   Cognition Arousal/Alertness: Awake/alert Behavior During Therapy: Restless Overall Cognitive Status: Impaired/Different from baseline Area of Impairment: Orientation;Attention;Memory;Following commands;Safety/judgement;Awareness;Problem solving                 Orientation Level: Disoriented to;Time Current Attention Level: Sustained Memory: Decreased recall of precautions;Decreased short-term memory Following Commands: Follows one step commands with increased time Safety/Judgement: Decreased awareness of safety;Decreased awareness of deficits Awareness: Emergent Problem Solving: Slow processing;Decreased initiation;Difficulty sequencing General Comments: Pt required Mod A + 2 with mobility and thinks he can go home tomorrow. Unable to recall hip precuations from yesterdays session. Perseverating on topics during session   General Comments       Exercises     Shoulder Instructions      Home Living Family/patient expects to be discharged to:: Inpatient rehab Living Arrangements: Spouse/significant other Available Help at Discharge: Family Type of Home: House Home Access: Stairs to enter [employer] of Steps: 3 Entrance Stairs-Rails: Right Home Layout: One level     Bathroom Shower/Tub: Teacher, early years/pre: Standard Bathroom Accessibility: Yes   Home Equipment: Environmental consultant - 2 wheels;Cane - single point          Prior Functioning/Environment Level of Independence: Independent                 OT Problem List: Decreased strength;Decreased range of motion;Impaired balance (sitting and/or standing);Decreased cognition;Decreased safety awareness;Decreased knowledge of use of DME or AE;Decreased knowledge of precautions;Pain       OT Treatment/Interventions: Self-care/ADL training;Therapeutic exercise;DME and/or AE instruction;Therapeutic activities;Cognitive remediation/compensation;Patient/family education;Balance training    OT Goals(Current goals can be found in the care plan section) Acute Rehab OT Goals Patient Stated Goal: decrease pain  OT Goal Formulation: With patient Time For Goal Achievement: 09/28/16 Potential to Achieve Goals: Good  OT Frequency: Min 2X/week   Barriers to D/C:            Co-evaluation PT/OT/SLP Co-Evaluation/Treatment: Yes Reason for Co-Treatment: For patient/therapist safety;Necessary to address cognition/behavior during functional activity;To address functional/ADL transfers   OT goals addressed during session: ADL's and self-care      AM-PAC PT "6 Clicks" Daily Activity     Outcome Measure Help from another person eating meals?: None Help from another person taking care of personal grooming?: None Help from another person toileting, which includes using toliet, bedpan, or urinal?: A Lot Help from another person bathing (including washing, rinsing, drying)?: A Lot Help from another person to put on and taking off regular upper body clothing?: None Help from another person to put on and taking off regular lower body clothing?: A Lot 6 Click Score: 18   End of Session Equipment Utilized During Treatment: Gait belt;Rolling walker Nurse Communication: Mobility status;Precautions;Weight bearing status  Activity Tolerance: Patient tolerated treatment well Patient left: in chair;with call bell/phone within reach;with chair alarm set  OT Visit Diagnosis: Unsteadiness on feet (R26.81);Muscle weakness (generalized) (M62.81);Other symptoms and signs involving cognitive function;Pain Pain - Right/Left: Right Pain - part of body: Hip  Time: 0930-1003 OT Time Calculation (min): 33 min Charges:  OT General Charges $OT Visit: 1 Procedure OT Evaluation $OT Eval  Moderate Complexity: 1 Procedure G-Codes:     [place], OT/L  [phone] 09/14/2016  Mikylah Ackroyd,HILLARY 09/14/2016, 10:16 AM

## 2016-09-14 NOTE — Progress Notes (Signed)
CM met with the patient to discuss d/c plans. He is wanting to d/c home when medically ready. He is willing to talk with CIR to see what they recommend.  CM provided him a [facility] list and will see him again tomorrow. Pt states his wife is going to take time off when he is ready to d/c home and his son will also be available to assist. CM following.

## 2016-09-14 NOTE — Progress Notes (Signed)
Patients treatment plan explained, reviewed and verbally confirmed by patient and Cecille Rubin (Patients wife). Patient is in need of a note for work for the week prior to admission. Patients wife encouraged to contact patients orthopedic doctor. Education on infection prevention and importance of PT and follow-up appointments provided to patient and patients wife. Patient and patient wife v/u.

## 2016-09-15 ENCOUNTER — Other Ambulatory Visit: Payer: BLUE CROSS/BLUE SHIELD

## 2016-09-15 DIAGNOSIS — T8459XA Infection and inflammatory reaction due to other internal joint prosthesis, initial encounter: Secondary | ICD-10-CM

## 2016-09-15 DIAGNOSIS — Z96649 Presence of unspecified artificial hip joint: Secondary | ICD-10-CM

## 2016-09-15 LAB — BASIC METABOLIC PANEL
ANION GAP: 6 (ref 5–15)
BUN: 12 mg/dL (ref 6–20)
CHLORIDE: 101 mmol/L (ref 101–111)
CO2: 25 mmol/L (ref 22–32)
Calcium: 7.8 mg/dL — ABNORMAL LOW (ref 8.9–10.3)
Creatinine, Ser: 0.74 mg/dL (ref 0.61–1.24)
Glucose, Bld: 92 mg/dL (ref 65–99)
POTASSIUM: 3.5 mmol/L (ref 3.5–5.1)
SODIUM: 132 mmol/L — AB (ref 135–145)

## 2016-09-15 LAB — HCV COMMENT:

## 2016-09-15 LAB — CBC
HCT: 25.8 % — ABNORMAL LOW (ref 39.0–52.0)
HEMOGLOBIN: 8.5 g/dL — AB (ref 13.0–17.0)
MCH: 31.5 pg (ref 26.0–34.0)
MCHC: 32.9 g/dL (ref 30.0–36.0)
MCV: 95.6 fL (ref 78.0–100.0)
PLATELETS: 359 10*3/uL (ref 150–400)
RBC: 2.7 MIL/uL — AB (ref 4.22–5.81)
RDW: 13 % (ref 11.5–15.5)
WBC: 9.9 10*3/uL (ref 4.0–10.5)

## 2016-09-15 LAB — HEPATITIS C ANTIBODY (REFLEX): HCV AB: 0.2 {s_co_ratio} (ref 0.0–0.9)

## 2016-09-15 MED ORDER — OXYCODONE HCL 5 MG PO TABS: 5.0000 mg | ORAL_TABLET | ORAL | 0 refills | Status: DC | PRN

## 2016-09-15 MED ORDER — FERROUS SULFATE 325 (65 FE) MG PO TABS: 325.0000 mg | ORAL_TABLET | Freq: Three times a day (TID) | ORAL | 0 refills | Status: DC

## 2016-09-15 MED ORDER — PANTOPRAZOLE SODIUM 40 MG PO TBEC: 40.0000 mg | DELAYED_RELEASE_TABLET | Freq: Every day | ORAL | 0 refills | Status: DC

## 2016-09-15 MED ORDER — RIFAMPIN 300 MG PO CAPS: 300.0000 mg | ORAL_CAPSULE | Freq: Two times a day (BID) | ORAL | 0 refills | Status: AC

## 2016-09-15 MED ORDER — ONDANSETRON HCL 4 MG PO TABS: 4.0000 mg | ORAL_TABLET | Freq: Four times a day (QID) | ORAL | 0 refills | Status: DC | PRN

## 2016-09-15 MED ORDER — CEFAZOLIN IV (FOR PTA / DISCHARGE USE ONLY): 2.0000 g | Freq: Three times a day (TID) | INTRAVENOUS | 0 refills | Status: AC

## 2016-09-15 MED ORDER — CHLORPROMAZINE HCL 25 MG PO TABS: 25.0000 mg | ORAL_TABLET | Freq: Three times a day (TID) | ORAL | 0 refills | Status: DC | PRN

## 2016-09-15 MED ORDER — METOCLOPRAMIDE HCL 5 MG PO TABS: 5.0000 mg | ORAL_TABLET | Freq: Three times a day (TID) | ORAL | 0 refills | Status: DC | PRN

## 2016-09-15 MED ORDER — HEPARIN SOD (PORK) LOCK FLUSH 100 UNIT/ML IV SOLN
250.0000 [IU] | INTRAVENOUS | Status: AC | PRN
  Administered 2016-09-15: 250 [IU]

## 2016-09-15 NOTE — Progress Notes (Signed)
Patient is being discharged home. Discharge instructions were reviewed with patient and wife. Patient and wife verbalized understanding.

## 2016-09-15 NOTE — Progress Notes (Signed)
    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  A cane will not resolve  issue with performing activities of daily living. A wheelchair will allow patient to safely perform daily activities. Patient can safely propel the wheelchair in the home or has a caregiver who can provide assistance.  Accessories: elevating leg rests (ELRs), wheel locks, extensions and anti-tippers.   09/15/16 1121

## 2016-09-15 NOTE — Care Management Note (Signed)
Case Management Note  Patient Details  Name: Jon Choi MRN: [id number] Date of Birth: April 27, 1950  Subjective/Objective:                    Action/Plan: Pt discharging home with [facility] services. Equipment to be delivered later this evening. Wife and patient agreeable to d/c home and have patient sit in recliner chair until hospital bed arrives. Wife and patient feel comfortable about drain flushes and IV antibiotics.   Wife to provide transportation home.   Expected Discharge Date:  09/12/16               Expected Discharge Plan:  [place]  In-House Referral:     Discharge planning Services  CM Consult  Post Acute Care Choice:  Home Health, Durable Medical Equipment Choice offered to:  Patient, Spouse  DME Arranged:  Hospital bed, Trapeze, Wheelchair manual DME Agency:  [place]:  RN, PT [facility] Agency:  [place]  Status of Service:  Completed, signed off  If discussed at [place] of Stay Meetings, dates discussed:    Additional Comments:  Pollie Friar, RN 09/15/2016, 3:54 PM

## 2016-09-15 NOTE — Discharge Summary (Addendum)
Physician Discharge Summary  Jon Choi NTZ:[id number] DOB: 01/21/1951 DOA: 09/08/2016  PCP: [name], MD  Admit date: 09/08/2016 Discharge date: 09/15/2016  Admitted From: Home Disposition:  Home  Recommendations for Outpatient Follow-up:  1. Follow up with PCP in  1-2 weeks 2. Follow up with Orthopedic Surgery as scheduled 3. Follow up with Infectious disease as scheduled 4. Continue routine PICC line care and d/c PICC when IV abx completed  [place] reviewed. No recent controlled substances are listed. Brief course of oxycodone will be prescribed for prosthetic joint infection  Discharge Condition:Stable CODE STATUS:Full Diet recommendation: Diabetic   Brief/Interim Summary: 66 y.o.malewith history of hypertension, hyperlipidemia, melanoma, right hip replacement, chronic back pain with L4-L5 disc protrusion and spondylolisthesis was brought to the ER forincreasing back pain with difficulty ambulating. Imaging reveals a right psoas abscess and severe stenosis of L4-L5 with compression fx at L5.  7/17- received epidural steroid injection by Dr Ernestina Patches (pain management) 7/18- per note by Dr Ninfa Linden, needs referral to Dr Saintclair Halsted, [place] for surgical eval  Underwent lavage and then aspiration of right hip and Perc drain of right psoas  Principal Problem: Iliopsoas abscess- MSSA-right Hip aspirate showing the same Sepsis - temp 100.3, WBC 14, HR in 100s, sepsis present on admission - IR guided drainage - drain remains in place - Psoas abscessculture and right hip lavage cultureshowing MSSA - d/c Vanc and Zosyn- started Ancef - blood cultures negative - ortho following for right prosthetichip infection- patient underwent debridement of R hip and removal of R acetabulum shell on 7/31 - ID recs noted. Plan for 6 weeks of cefazolin through 9/10 - ID also recommends rifampin 36m po bid x at least 6 weeks - PICC was placed 8/1 - ID recommends repeat CT pelvis in 4 weeks  with ID clinic follow up in 4-6 weeks - Recommendations for SNF vs CIR, however patient is insisting on discharging home with home health. Patient is alert and oriented, able to make medical decisions. Patient understands role of SNF, however adamant about going home.  Active Problems: Acute encephalopathy - suspect secondary to polypharmacy/narcotics - Now tolerating oxycodone - NCC[place] reviewed. No recent controlled substances are listed. Will prescribe short course of oxycodone for pain - continue to wean Narcotics as tolerated  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-L5 level - neurosurgery notes that he is current not a candidate for surgery with an ongoing infectious process - Remains stable at present  ARF (acute renal failure)  - due to sepsis, dehydration - renal function normalized  Hyponatremia  - dehydration improved with IVF  Hypokalemia - labs reviewed - potassium within normal limits  Hiccoughs - ContinueThorazine as needed  Hypertension - Continue Metoprolol as tolerated  HLD (hyperlipidemia) - Continue with Zocor - Appears stable at present  Nicotine abuse - per wife, he "vapes"  - Continued on Nicotine patch while in hospital  Discharge Diagnoses:  Principal Problem:   Sepsis ([place] Active Problems:   HLD (hyperlipidemia)   Hypertension   Iliopsoas abscess on right ([place]   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-L5 level   ARF (acute renal failure) (HCC)   Abscess   Prosthetic hip infection ([place]   MSSA (methicillin susceptible Staphylococcus aureus) infection    Discharge Instructions  Discharge Instructions    Home infusion instructions [facility] May follow [place]osing Protocol; May administer Cathflo as needed to maintain patency of vascular access device.; Flushing [facility] vascular access device[facility] 0.9% NaCl pre/post medica..Marland KitchenMarland Kitchen  Complete by:  As directed    Instructions:  May follow [place] Dosing Protocol   Instructions:  May  administer Cathflo as needed to maintain patency of vascular access device.   Instructions:  Flushing of vascular access device: per [facility] Protocol: 0.9% NaCl pre/post medication administration and prn patency; Heparin 100 u/ml, 30m for implanted ports and Heparin 10u/ml, 560mfor all other central venous catheters.   Instructions:  May follow [facility] Anaphylaxis Protocol for First Dose Administration in the home: 0.9% NaCl at 25-50 ml/hr to maintain IV access for protocol meds. Epinephrine 0.3 ml IV/IM PRN and Benadryl 25-50 IV/IM PRN s/s of anaphylaxis.   Instructions:  Ad[place]ensselaernfusion Coordinator (RN) to assist per patient IV care needs in the home PRN.     Allergies as of 09/15/2016   No Known Allergies     Medication List    STOP taking these medications   diazepam 5 MG tablet Commonly known as:  VALIUM   HYDROcodone-acetaminophen 10-325 MG tablet Commonly known as:  NORCO   oxyCODONE-acetaminophen 5-325 MG tablet Commonly known as:  PERCOCET/ROXICET   tiZANidine 4 MG tablet Commonly known as:  ZANAFLEX   traMADol 50 MG tablet Commonly known as:  ULTRAM     TAKE these medications   ceFAZolin IVPB Commonly known as:  ANCEF Inject 2 g into the vein every 8 (eight) hours. Indication:  prosthetic joint infection with MSSA Last Day of Therapy:  9/10 Labs - Once weekly:  CBC/D and BMP, Labs - Every other week:  ESR and CRP   celecoxib 200 MG capsule Commonly known as:  CELEBREX Take 200 mg by mouth daily.   chlorproMAZINE 25 MG tablet Commonly known as:  THORAZINE Take 1 tablet (25 mg total) by mouth 3 (three) times daily as needed for hiccoughs.   ferrous sulfate 325 (65 FE) MG tablet Take 1 tablet (325 mg total) by mouth 3 (three) times daily after meals.   metoCLOPramide 5 MG tablet Commonly known as:  REGLAN Take 1-2 tablets (5-10 mg total) by mouth every 8 (eight) hours as needed for nausea (if ondansetron (ZOFRAN) ineffective.).   metoprolol succinate 50 MG 24  hr tablet Commonly known as:  TOPROL-XL Take 50 mg by mouth daily.   naproxen sodium 220 MG tablet Commonly known as:  ANAPROX Take 440 mg by mouth daily as needed (pain).   ondansetron 4 MG tablet Commonly known as:  ZOFRAN Take 1 tablet (4 mg total) by mouth every 6 (six) hours as needed for nausea.   oxyCODONE 5 MG immediate release tablet Commonly known as:  Oxy IR/ROXICODONE Take 1-2 tablets (5-10 mg total) by mouth every 4 (four) hours as needed for severe pain or moderate pain.   pantoprazole 40 MG tablet Commonly known as:  PROTONIX Take 1 tablet (40 mg total) by mouth daily.   rifampin 300 MG capsule Commonly known as:  RIFADIN Take 1 capsule (300 mg total) by mouth 2 (two) times daily.   simvastatin 20 MG tablet Commonly known as:  ZOCOR Take 20 mg by mouth daily.   temazepam 30 MG capsule Commonly known as:  RESTORIL Take 30 mg by mouth at bedtime.            Home Infusion Instuctions        Start     Ordered   09/15/16 0000  Home infusion instructions [facility] May follow ACEast Valleyosing Protocol; May administer Cathflo as needed to maintain patency  of vascular access device.; Flushing of vascular access device: per [facility] Protocol: 0.9% NaCl pre/post medica...    Question Answer Comment  Instructions May follow [place] Dosing Protocol   Instructions May administer Cathflo as needed to maintain patency of vascular access device.   Instructions Flushing of vascular access device: per [facility] Protocol: 0.9% NaCl pre/post medication administration and prn patency; Heparin 100 u/ml, 70m for implanted ports and Heparin 10u/ml, 530mfor all other central venous catheters.   Instructions May follow [facility] Anaphylaxis Protocol for First Dose Administration in the home: 0.9% NaCl at 25-50 ml/hr to maintain IV access for protocol meds. Epinephrine 0.3 ml IV/IM PRN and Benadryl 25-50 IV/IM PRN s/s of anaphylaxis.   Instructions [facility] Infusion  Coordinator (RN) to assist per patient IV care needs in the home PRN.      09/15/16 15[place]      Start     Ordered   09/15/16 1122  For home use only DME Hospital bed  Once    Question Answer Comment  Patient has (list medical condition): R psoas abscess   The above medical condition requires: Patient requires the ability to reposition frequently   Bed type Semi-electric   [place] Yes      09/15/16 1121   09/15/16 1121  For home use only DME standard manual wheelchair with seat cushion  Once    Comments:  Patient suffers from R psoas abscess which impairs their ability to perform daily activities like bathing in the home.  A cane will not resolve  issue with performing activities of daily living. A wheelchair will allow patient to safely perform daily activities. Patient can safely propel the wheelchair in the home or has a caregiver who can provide assistance.  Accessories: elevating leg rests (ELRs), wheel locks, extensions and anti-tippers.   09/15/16 1121     Follow-up Information    OlParalee CancelMD. Schedule an appointment as soon as possible for a visit in 2 week(s).   Specialty:  Orthopedic Surgery Contact information: [address] [place] [phone]      ShMayra NeerMD. Schedule an appointment as soon as possible for a visit in 2 week(s).   Specialty:  Family Medicine Contact information: [phone]        VaTommy MedalCoLavell IslamMD Follow up.   Specialty:  Infectious Diseases Why:  you will be contacted to schedule an appointment Contact information: [phone]        No Known Allergies  Consultations:   Orthopedic Surgery  IR  Infectious disease  Neurosurgery  Procedures/Studies: Dg Chest 2 View  Result Date: 09/08/2016 CLINICAL DATA:  Shortness of Breath  EXAM: CHEST  2 VIEW COMPARISON:  05/25/2007 FINDINGS: The heart size and mediastinal contours are within normal limits. Both lungs are clear. The visualized skeletal structures are unremarkable. IMPRESSION: No active cardiopulmonary disease. Electronically Signed   By: MaInez Catalina.D.   On: 09/08/2016 18:19   Dg Lumbar Spine Complete  Result Date: 09/03/2016 CLINICAL DATA:  FeGolden Circleackwards today well dog walking. Low back pain. EXAM: LUMBAR SPINE - COMPLETE 4+ VIEW COMPARISON:  06/08/2015 FINDINGS: no evidence of fracture. Chronic lower lumbar facet arthropathy and degenerative disc disease with disc space narrowing. Chronic anterolisthesis at L4-5 of 7 mm because  of the facet arthropathy. No acute or traumatic finding. IMPRESSION: No acute or traumatic finding. Chronic lumbar degenerative disc disease and degenerative facet disease. Anterolisthesis at L4-5 because of the facet disease. Electronically Signed   By: Nelson Chimes M.D.   On: 09/03/2016 19:06   Ct Head Wo Contrast  Result Date: 09/08/2016 CLINICAL DATA:  Neck pain, weakness and confusion.  Fell 1 week ago. EXAM: CT HEAD WITHOUT CONTRAST CT CERVICAL SPINE WITHOUT CONTRAST TECHNIQUE: Multidetector CT imaging of the head and cervical spine was performed following the standard protocol without intravenous contrast. Multiplanar CT image reconstructions of the cervical spine were also generated. COMPARISON:  None. FINDINGS: CT HEAD FINDINGS Brain: Diffusely enlarged ventricles and subarachnoid spaces. No intracranial hemorrhage, mass lesion or CT evidence of acute infarction. Vascular: No hyperdense vessel or unexpected calcification. Skull: Normal. Negative for fracture or focal lesion. Sinuses/Orbits: Mild bilateral inferior frontal and anterior ethmoid sinus mucosal thickening. Unremarkable orbits. Other: None. CT CERVICAL SPINE FINDINGS Alignment: Reversal of the normal cervical lordosis. Mild anterolisthesis at the C3-4 and C4-5 levels. Skull  base and vertebrae: No acute fracture. No primary bone lesion or focal pathologic process. Soft tissues and spinal canal: No prevertebral fluid or swelling. No visible canal hematoma. Disc levels: Multilevel degenerative changes. These include facet degenerative changes throughout the cervical spine. Upper chest: Mild biapical pleural and parenchymal scarring. Other: Bilateral carotid artery calcifications. IMPRESSION: 1. No skull fracture or intracranial hemorrhage. 2. No cervical spine fracture or traumatic subluxation. 3. Mild diffuse cerebral atrophy. 4. Multilevel cervical spine degenerative changes. These include facet degenerative changes with associated mild anterolisthesis at the C3-4 C4-5 levels. 5. Dense bilateral carotid artery atheromatous calcifications. 6. Minimal chronic bilateral frontal and bilateral ethmoid sinusitis. Electronically Signed   By: Claudie Revering M.D.   On: 09/08/2016 18:08   Ct Cervical Spine Wo Contrast  Result Date: 09/08/2016 CLINICAL DATA:  Neck pain, weakness and confusion.  Fell 1 week ago. EXAM: CT HEAD WITHOUT CONTRAST CT CERVICAL SPINE WITHOUT CONTRAST TECHNIQUE: Multidetector CT imaging of the head and cervical spine was performed following the standard protocol without intravenous contrast. Multiplanar CT image reconstructions of the cervical spine were also generated. COMPARISON:  None. FINDINGS: CT HEAD FINDINGS Brain: Diffusely enlarged ventricles and subarachnoid spaces. No intracranial hemorrhage, mass lesion or CT evidence of acute infarction. Vascular: No hyperdense vessel or unexpected calcification. Skull: Normal. Negative for fracture or focal lesion. Sinuses/Orbits: Mild bilateral inferior frontal and anterior ethmoid sinus mucosal thickening. Unremarkable orbits. Other: None. CT CERVICAL SPINE FINDINGS Alignment: Reversal of the normal cervical lordosis. Mild anterolisthesis at the C3-4 and C4-5 levels. Skull base and vertebrae: No acute fracture. No primary  bone lesion or focal pathologic process. Soft tissues and spinal canal: No prevertebral fluid or swelling. No visible canal hematoma. Disc levels: Multilevel degenerative changes. These include facet degenerative changes throughout the cervical spine. Upper chest: Mild biapical pleural and parenchymal scarring. Other: Bilateral carotid artery calcifications. IMPRESSION: 1. No skull fracture or intracranial hemorrhage. 2. No cervical spine fracture or traumatic subluxation. 3. Mild diffuse cerebral atrophy. 4. Multilevel cervical spine degenerative changes. These include facet degenerative changes with associated mild anterolisthesis at the C3-4 C4-5 levels. 5. Dense bilateral carotid artery atheromatous calcifications. 6. Minimal chronic bilateral frontal and bilateral ethmoid sinusitis. Electronically Signed   By: Claudie Revering M.D.   On: 09/08/2016 18:08   Ct Lumbar Spine Wo Contrast  Result Date: 09/08/2016 CLINICAL DATA:  Chronic low back pain and left leg numbness history  of bone spurs EXAM: CT LUMBAR SPINE WITHOUT CONTRAST TECHNIQUE: Multidetector CT imaging of the lumbar spine was performed without intravenous contrast administration. Multiplanar CT image reconstructions were also generated. COMPARISON:  MRI 09/08/2016, radiograph 09/03/2016 FINDINGS: Segmentation: 5 lumbar type vertebrae. Alignment: Grade 1 anterolisthesis of L4 on L5. Lumbar alignment otherwise within normal limits. Vertebrae: Mild irregularity anterior superior endplate at L5 corresponding to MRI fracture. Paraspinal and other soft tissues: Aortic atherosclerosis. Partially visualized hypodense mass, likely intramuscular within the right pelvis. Disc levels: At T12-L1, no significant canal stenosis or disc disease. The foramen are patent bilaterally. At L2-L3, no significant disc disease or canal stenosis. The foramen are patent bilaterally. At L2-L3, no significant disc disease or canal stenosis. No significant canal stenosis or bony  foraminal narrowing. Posterior facet arthropathy At L3-L4, no significant disc disease or canal stenosis. No significant bony foraminal narrowing. Posterior facet arthropathy At L4-L5, mild disc space narrowing with vacuum disc. Moderate to large disc extrusion with marked canal stenosis, and lateral extension of disc material. Bilateral facet hypertrophy. At L5-S1, no significant canal stenosis or focal disc disease. The foramen are patent bilaterally. IMPRESSION: 1. Minimal irregularity anterior superior endplate at L5, corresponding to subtle compression fracture noted on comparison MRI. L4 posttraumatic changes on MRI not well visualized by CT. No fracture of the posterior elements. 2. 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 Bilateral foraminal impingement. See MRI report. 3. Partially visualized hypodense mass within the right iliacus. Uncertain if this represents a hematoma or soft tissue mass. There is some edema and soft tissue stranding around the right psoas muscle, further evaluation with pelvic CT could be obtained if desired. Electronically Signed   By: Donavan Foil M.D.   On: 09/08/2016 21:11   Ct Pelvis Wo Contrast  Result Date: 09/08/2016 CLINICAL DATA:  Chronic low back pain and left leg numbness. Recent diagnosis of bone spurs in the lumbar region. EXAM: CT PELVIS WITHOUT CONTRAST TECHNIQUE: Multidetector CT imaging of the pelvis was performed following the standard protocol without intravenous contrast. COMPARISON:  None. FINDINGS: Urinary Tract: Bladder wall is not thickened and no filling defects are identified. Bowel: Visualized portions of colon and small bowel are not abnormally distended. Stool-filled colon. Appendix is normal. Vascular/Lymphatic: Calcifications in the aorta and iliac vessels. No aneurysm. Reproductive: Prostate gland is enlarged, measuring about 4.8 cm diameter. Other: There  is infiltration along the right iliopsoas margin with a low-attenuation expansile focus demonstrated in the iliacus muscle measuring about 2.7 x 4 cm in diameter. Appearance is suspicious for any iliopsoas abscess with adjacent inflammatory stranding. Visualization of the area around the right hip is limited due to streak artifact from right hip arthroplasty, but there appears to be small air-fluid levels with fluid collection anterior to the right hip which probably represents extension of the abscess. Musculoskeletal: Postoperative changes with right total hip arthroplasty. Visualized components appear well seated although visualization is limited due to streak artifact. No evidence of acute fracture or dislocation as visualized. SI joints and symphysis pubis are not displaced. Visualized sacrum and pelvis appear intact. Degenerative changes noted in the lower lumbar spine and in the left hip. IMPRESSION: 1. Inflammatory infiltration in around the right iliopsoas muscle with loculated iliacus muscle abscess and probable extension of the abscess anterior to the right hip arthroplasty. 2. Right total hip arthroplasty. 3. No acute bony abnormalities identified. 4. Degenerative changes in the lower lumbar  spine and left hip. Electronically Signed   By: Lucienne Capers M.D.   On: 09/08/2016 20:58   Mr Lumbar Spine Wo Contrast  Result Date: 09/08/2016 CLINICAL DATA:  Patient fell 2 days ago, increasing LEFT leg pain and weakness. EXAM: MRI LUMBAR SPINE WITHOUT CONTRAST TECHNIQUE: Multiplanar, multisequence MR imaging of the lumbar spine was performed. No intravenous contrast was administered. COMPARISON:  Plain films 09/03/2016. FINDINGS: Segmentation:  Standard. Alignment:  5 mm anterolisthesis L4-5. Vertebrae: There is a subtle superior endplate compression fracture of L5 anteriorly. Bone marrow edema is also seen in L4 vertebral body inferiorly. Conus medullaris: Extends to the L1 level and appears normal.  Paraspinal and other soft tissues: There is a large hematoma in the RIGHT iliacus muscle. This is incompletely evaluated on axial images, as its inferior extent is not clearly identified, but cross-sectional measurements on image 35 are 25 x 39 mm. Disc levels: L1-L2:  Normal. L2-L3:  Normal. L3-L4:  Normal. L4-L5: 5 mm anterolisthesis is facet mediated. There is marked posterior element hypertrophy. There is a large disc extrusion with a cephalad migrated free fragment. Severe spinal stenosis is present. BILATERAL subarticular zone and foraminal zone narrowing affect the L4 and L5 nerve roots. Superimposed LEFT lateral extrusion at L4-5 in the extraforaminal compartment was present previously. L5-S1: Unremarkable disc space. Facet arthropathy. No impingement. IMPRESSION: Constellation of findings suggesting a minor posttraumatic superior endplate fracture at L5. There is also slight bone marrow edema noted inferiorly at L4, which could be due to the recent fall. Recommend CT lumbar spine without contrast to look for an occult fracture of the posterior elements. 5 mm anterolisthesis at L4-5 in conjunction with posterior element hypertrophy and a large extruded disc fragment contribute to severe spinal stenosis and BILATERAL L4 and L5 nerve root impingement. The stenosis and neural impingement have significantly progressed from prior MR of 06/08/2015. Large RIGHT iliacus hematoma? This too is likely posttraumatic although in the setting of history of malignant melanoma, a metastasis cannot completely be excluded. Recommend CT pelvis with contrast, for further evaluation. Electronically Signed   By: Staci Righter M.D.   On: 09/08/2016 18:54   Mr Lumbar Spine W Wo Contrast (assess For Abscess, Cord Compression)  Result Date: 09/09/2016 CLINICAL DATA:  Increasing low back pain with difficulty ambulating. Leg pain and weakness. Recent fall. EXAM: MRI LUMBAR SPINE WITHOUT AND WITH CONTRAST TECHNIQUE: Multiplanar and  multiecho pulse sequences of the lumbar spine were obtained without and with intravenous contrast. CONTRAST:  20 mL MultiHance COMPARISON:  Lumbar spine MRI and CT 09/08/2016 FINDINGS: The study is moderately motion degraded, particularly on axial sequences. Segmentation:  Standard. Alignment:  Unchanged grade 1 anterolisthesis of L4 on L5. Vertebrae: An L5 superior endplate compression fracture with minimal vertebral body height loss and mild endplate edema is unchanged from yesterday's MRI. Mild edema along the L4 inferior endplate and in the posterior L4 vertebral body is also unchanged without a frank compression fracture deformity. No abnormal fluid signal or enhancement is seen in the L4-5 disc space to indicate discitis, and there is also vacuum disc phenomenon which is a negative predictor for infection. There is a small ventral epidural fluid collection in the midline beginning at the mid L4 vertebral body level just above the extruded disc and extending superiorly to the mid L1 vertebral body level. There is no significant associated enhancement aside from a small amount at L4 just above the disc. Conus medullaris: Extends to the L1-2 level and appears normal.  Paraspinal and other soft tissues: There are multiple fluid collections within the posterior paraspinal soft tissues, right much greater than left. These extend to the posterior margins of the facet joints on the right from L2-L5 and on the left from L3-L5 with mild surrounding enhancement. There is underlying chronic facet joint arthritis at these levels with facet joint effusions bilaterally, most notable on the left at L4-5. However, no significant marrow edema/ enhancement or osseous destructive changes are seen on MRI or CT to clearly indicate septic facet arthritis. As previously described on MRI and CT, there is a T1 hyperintense collection in the right iliacus muscle, incompletely visualized. Disc levels: Detailed assessment of lumbar  degenerative changes is deferred to the recent, less motion degraded MRI. The small ventral epidural collection from L1-L4 does not appear grossly enlarged compared to that study though it does contribute to mild multifactorial spinal stenosis at L2-3 and L3-4, partially congenital in nature with posterior element hypertrophy and mildly prominent dorsal epidural fat contributing. Severe spinal stenosis at L4-5 due to anterolisthesis, central disc extrusion, ligamentum flavum thickening, and advanced facet arthrosis does not appear significantly changed, nor does severe right and moderate left neural foraminal stenosis. IMPRESSION: 1. Motion degraded examination. 2. Unchanged mild L5 superior endplate compression fracture and mild L4 vertebral body edema without significant height loss. No evidence of infectious discitis. 3. Grade 1 anterolisthesis of L4 on L5 with unchanged disc extrusion and severe spinal stenosis. Small ventral epidural fluid collection from L1-L4 is without associated enhancement and is not felt to represent abscess but may instead be related to the disc extrusion. 4. Multiple fluid collections with mild surrounding enhancement in the posterior paraspinal soft tissues/musculature, right greater than left. Some of these extend to/communicate with the facet joints. Infection/abscesses are possible, however no osseous changes strongly suggestive of septic facet arthritis are identified and it is possible that these collections reflect underlying degenerative facet arthritis with joint effusions/synovial cysts and soft tissue/intramuscular hematomas related to the recent fall. 5. Partially visualized fluid collection in the right iliacus muscle as previously described, possibly hematoma though abscess is not excluded. Electronically Signed   By: Logan Bores M.D.   On: 09/09/2016 10:43   Ct Aspiration  Result Date: 09/09/2016 CLINICAL DATA:  Back pain. MR demonstrates right iliacus and iliopsoas  bursal fluid collections. Patient has a right hip prosthesis and aspiration is requested to exclude septic joint. EXAM: CT GUIDED ASPIRATION BIOPSY OF RIGHT HIP ANESTHESIA/SEDATION: Intravenous Fentanyl and Versed were administered as conscious sedation during continuous monitoring of the patient's level of consciousness and physiological / cardiorespiratory status by the radiology RN, with a total moderate sedation time of 15 minutes. PROCEDURE: The procedure risks, benefits, and alternatives were explained to the patient. Questions regarding the procedure were encouraged and answered. The patient understands and consents to the procedure. Select axial scans through the right hip were obtained. An appropriate skin entry site was determined and marked. The operative field was prepped with chlorhexidinein a sterile fashion, and a sterile drape was applied covering the operative field. A sterile gown and sterile gloves were used for the procedure. Local anesthesia was provided with 1% Lidocaine. Under CT fluoroscopic guidance, an 18 gauge spinal needle was advanced to the anterior aspect of the prosthetic femoral head using a steep anterolateral approach in order to avoid the distended iliopsoas bursa. Initial aspiration returned no joint fluid. After lavage with 5 mL nonbacteriostatic saline, a scant amount of clear yellow joint fluid was aspirated, sent  for Gram stain and culture. The patient tolerated the procedure well. COMPLICATIONS: None immediate FINDINGS: No significant fluid returned on initial aspiration. A scant amount of fluid was returned post lavage, sent for Gram stain and culture as above. IMPRESSION: 1. Technically successful right hip aspiration under CT guidance Electronically Signed   By: Lucrezia Europe M.D.   On: 09/09/2016 13:45   Dg Epidural/nerve Root  Result Date: 08/29/2016 CLINICAL DATA:  Lumbosacral spondylosis without myelopathy with radiculopathy. Predominantly left-sided low back and  buttock pain with worsening numbness throughout the left leg. No significant improvement from the L4-5 facet injection. Progressively diminishing duration of improvement from L4-5 interlaminar epidural injections. Multifactorial spinal stenosis and left-sided lateral recess and neural foraminal stenosis at L4-5 on MRI. EXAM: EPIDURAL/NERVE ROOT FLUOROSCOPY TIME:  Radiation Exposure Index (as provided by the fluoroscopic device): 24.81 microGray*m^2 Fluoroscopy Time (in minutes and seconds):  17 seconds PROCEDURE: The procedure, risks, benefits, and alternatives were explained to the patient. Questions regarding the procedure were encouraged and answered. The patient understands and consents to the procedure. LEFT L4 NERVE ROOT BLOCK AND TRANSFORAMINAL EPIDURAL: A posterior oblique approach was taken to the intervertebral foramen on the left at L4-5 using a curved 3.5 inch 22 gauge spinal needle. Injection of Isovue-M 200 outlined the left L4 nerve root and showed good epidural spread. No vascular opacification is seen. 120 mg of Depo-Medrol mixed with 2 mL of 1% lidocaine were instilled. The procedure was well-tolerated, and the patient was discharged thirty minutes following the injection in good condition. COMPLICATIONS: None IMPRESSION: Technically successful injection consisting of a left L4 nerve root block and transforaminal epidural. Electronically Signed   By: Logan Bores M.D.   On: 08/29/2016 10:41   Dg Hip Unilat With Pelvis 2-3 Views Right  Result Date: 09/11/2016 CLINICAL DATA:  Right hip pain.  No known injury. EXAM: DG HIP (WITH OR WITHOUT PELVIS) 2-3V RIGHT COMPARISON:  CT pelvis for abscess drain catheter placement 09/09/2016. CT pelvis 09/08/2016 FINDINGS: Postoperative left hip hemiarthroplasty with non cemented components. Components appear well seated. No evidence of acute fracture or dislocation in the pelvis or right hip. Pigtail drainage catheter projected over the right pelvis. SI joints  and symphysis pubis are not displaced. Left hip demonstrates degenerative changes. IMPRESSION: Right hip hemiarthroplasty appears well seated. No acute fracture or dislocation. Drainage catheter projected over the right pelvis. Electronically Signed   By: Lucienne Capers M.D.   On: 09/11/2016 23:50   Ct Image Guided Drainage By Percutaneous Catheter  Result Date: 09/09/2016 CLINICAL DATA:  Back pain. MR and CT suggests right iliacus abscess. EXAM: CT GUIDED DRAINAGE OF RIGHT PELVIC ABSCESS ANESTHESIA/SEDATION: Intravenous Fentanyl and Versed were administered as conscious sedation during continuous monitoring of the patient's level of consciousness and physiological / cardiorespiratory status by the radiology RN, with a total moderate sedation time of 30 minutes. PROCEDURE: The procedure, risks, benefits, and alternatives were explained to the patient. Questions regarding the procedure were encouraged and answered. The patient understands and consents to the procedure. Select axial scans through the pelvis were obtained. The collection was localized and an appropriate skin entry site was determined and marked. The operative field was prepped with chlorhexidinein a sterile fashion, and a sterile drape was applied covering the operative field. A sterile gown and sterile gloves were used for the procedure. Local anesthesia was provided with 1% Lidocaine. Under CT fluoroscopic guidance, a 19 gauge percutaneous entry needle was advanced into the collection. A small amount purulent  material was aspirated. An Amplatz guidewire advanced easily within the collection, its position confirmed on CT. Tract dilated to facilitate placement of a 12 French pigtail catheter, formed within the dependent aspect of the collection. 30 mL of purulent fluid were aspirated, a sample sent for Gram stain and culture. Catheter secured externally with 0 Prolene suture and StatLock and placed to gravity drain bag. The patient tolerated the  procedure well. COMPLICATIONS: None immediate FINDINGS: The right iliacus low-attenuation collection was localized. Aspiration returned purulent material. A 12 French drain catheter was placed as above. IMPRESSION: 1. Technically successful CT-guided pelvic abscess drain catheter placement. Electronically Signed   By: Lucrezia Europe M.D.   On: 09/09/2016 14:15    Subjective: Eager to go home  Discharge Exam: Vitals:   09/15/16 1000 09/15/16 1410  BP: 133/71 128/65  Pulse: 98 96  Resp: 20 20  Temp: 97.6 F (36.4 C) (!) 97.5 F (36.4 C)   Vitals:   09/15/16 0415 09/15/16 0852 09/15/16 1000 09/15/16 1410  BP: 122/67 130/64 133/71 128/65  Pulse: 85 96 98 96  Resp: 18  20 20   Temp: 98.1 F (36.7 C)  97.6 F (36.4 C) (!) 97.5 F (36.4 C)  TempSrc: Oral  Oral Oral  SpO2: 98%  99% 99%  Weight:      Height:        General: Pt is alert, awake, not in acute distress Cardiovascular: RRR, S1/S2 +, no rubs, no gallops Respiratory: CTA bilaterally, no wheezing, no rhonchi Abdominal: Soft, NT, ND, bowel sounds + Extremities: no edema, no cyanosis   The results of significant diagnostics from this hospitalization (including imaging, microbiology, ancillary and laboratory) are listed below for reference.     Microbiology: Recent Results (from the past 240 hour(s))  Culture, blood (routine x 2)     Status: None   Collection Time: 09/09/16 12:20 AM  Result Value Ref Range Status   Specimen Description BLOOD RIGHT HAND  Final   Special Requests   Final    BOTTLES DRAWN AEROBIC AND ANAEROBIC Blood Culture adequate volume   Culture NO GROWTH 5 DAYS  Final   Report Status 09/14/2016 FINAL  Final  Culture, blood (routine x 2)     Status: None   Collection Time: 09/09/16 12:25 AM  Result Value Ref Range Status   Specimen Description BLOOD RIGHT WRIST  Final   Special Requests   Final    BOTTLES DRAWN AEROBIC AND ANAEROBIC Blood Culture adequate volume   Culture NO GROWTH 5 DAYS  Final    Report Status 09/14/2016 FINAL  Final  Body fluid culture     Status: None   Collection Time: 09/09/16 12:49 PM  Result Value Ref Range Status   Specimen Description FLUID RIGHT HIP  Final   Special Requests Normal  Final   Gram Stain   Final    MODERATE WBC PRESENT, PREDOMINANTLY PMN MODERATE GRAM POSITIVE COCCI IN PAIRS IN CLUSTERS    Culture MODERATE STAPHYLOCOCCUS AUREUS  Final   Report Status 09/12/2016 FINAL  Final   Organism ID, Bacteria STAPHYLOCOCCUS AUREUS  Final      Susceptibility   Staphylococcus aureus - MIC*    CIPROFLOXACIN <=0.5 SENSITIVE Sensitive     ERYTHROMYCIN <=0.25 SENSITIVE Sensitive     GENTAMICIN <=0.5 SENSITIVE Sensitive     OXACILLIN <=0.25 SENSITIVE Sensitive     TETRACYCLINE <=1 SENSITIVE Sensitive     VANCOMYCIN <=0.5 SENSITIVE Sensitive     TRIMETH/SULFA <=10 SENSITIVE Sensitive  CLINDAMYCIN <=0.25 SENSITIVE Sensitive     RIFAMPIN <=0.5 SENSITIVE Sensitive     Inducible Clindamycin NEGATIVE Sensitive     * MODERATE STAPHYLOCOCCUS AUREUS  Aerobic/Anaerobic Culture (surgical/deep wound)     Status: None   Collection Time: 09/09/16  2:17 PM  Result Value Ref Range Status   Specimen Description ABSCESS RIGHT PELVIS  Final   Special Requests Normal  Final   Gram Stain   Final    ABUNDANT WBC PRESENT, PREDOMINANTLY PMN ABUNDANT GRAM POSITIVE COCCI IN CLUSTERS    Culture   Final    ABUNDANT STAPHYLOCOCCUS AUREUS NO ANAEROBES ISOLATED    Report Status 09/14/2016 FINAL  Final   Organism ID, Bacteria STAPHYLOCOCCUS AUREUS  Final      Susceptibility   Staphylococcus aureus - MIC*    CIPROFLOXACIN <=0.5 SENSITIVE Sensitive     ERYTHROMYCIN <=0.25 SENSITIVE Sensitive     GENTAMICIN <=0.5 SENSITIVE Sensitive     OXACILLIN 0.5 SENSITIVE Sensitive     TETRACYCLINE <=1 SENSITIVE Sensitive     VANCOMYCIN <=0.5 SENSITIVE Sensitive     TRIMETH/SULFA <=10 SENSITIVE Sensitive     CLINDAMYCIN <=0.25 SENSITIVE Sensitive     RIFAMPIN <=0.5 SENSITIVE  Sensitive     Inducible Clindamycin NEGATIVE Sensitive     * ABUNDANT STAPHYLOCOCCUS AUREUS  Surgical pcr screen     Status: None   Collection Time: 09/12/16  3:47 PM  Result Value Ref Range Status   MRSA, PCR NEGATIVE NEGATIVE Final   Staphylococcus aureus NEGATIVE NEGATIVE Final    Comment:        The Xpert SA Assay (FDA approved for NASAL specimens in patients over 43 years of age), is one component of a comprehensive surveillance program.  Test performance has been validated by [facility] for patients greater than or equal to 76 year old. It is not intended to diagnose infection nor to guide or monitor treatment.   Aerobic/Anaerobic Culture (surgical/deep wound)     Status: None (Preliminary result)   Collection Time: 09/12/16  6:26 PM  Result Value Ref Range Status   Specimen Description ABSCESS RIGHT HIP  Final   Special Requests NONE  Final   Gram Stain   Final    ABUNDANT WBC PRESENT, PREDOMINANTLY PMN FEW GRAM POSITIVE COCCI IN CLUSTERS    Culture   Final    MODERATE STAPHYLOCOCCUS AUREUS SUSCEPTIBILITIES PERFORMED ON PREVIOUS CULTURE WITHIN THE LAST 5 DAYS. NO ANAEROBES ISOLATED; CULTURE IN PROGRESS FOR 5 DAYS    Report Status PENDING  Incomplete     Labs: BNP (last 3 results) No results for input(s): BNP in the last 8760 hours. Basic Metabolic Panel:  Recent Labs Lab 09/11/16 0334 09/12/16 0302 09/13/16 0752 09/14/16 0355 09/15/16 0509  NA 136 130* 130* 132* 132*  K 3.3* 3.8 4.0 3.5 3.5  CL 97* 97* 98* 101 101  CO2 30 25 23 25 25   GLUCOSE 120* 117* 119* 106* 92  BUN 22* 20 17 20 12   CREATININE 0.92 0.84 0.75 0.89 0.74  CALCIUM 8.7* 8.5* 8.0* 8.1* 7.8*   Liver Function Tests:  Recent Labs Lab 09/08/16 1926 09/09/16 0359  AST 47* 41  ALT 35 31  ALKPHOS 73 72  BILITOT 1.2 1.0  PROT 6.9 6.6  ALBUMIN 2.7* 2.3*   No results for input(s): LIPASE, AMYLASE in the last 168 hours. No results for input(s): AMMONIA in the last 168  hours. CBC:  Recent Labs Lab 09/08/16 1926 09/09/16 0359  09/11/16 0334 09/12/16  0302 09/13/16 0752 09/14/16 0355 09/15/16 0509  WBC 14.7* 14.4*  < > 12.4* 11.9* 12.6* 13.1* 9.9  NEUTROABS 13.0* 12.9*  --   --   --   --   --   --   HGB 12.7* 11.9*  < > 12.0* 12.5* 10.7* 9.7* 8.5*  HCT 37.3* 36.0*  < > 36.6* 37.2* 32.0* 29.3* 25.8*  MCV 94.2 94.7  < > 97.3 95.1 95.0 96.1 95.6  PLT 219 235  < > 325 361 382 395 359  < > = values in this interval not displayed. Cardiac Enzymes: No results for input(s): CKTOTAL, CKMB, CKMBINDEX, TROPONINI in the last 168 hours. BNP: Invalid input(s): POCBNP CBG:  Recent Labs Lab 09/13/16 2114 09/14/16 0618 09/14/16 1148 09/14/16 1611 09/14/16 2134  GLUCAP 111* 107* 119* 87 100*   D-Dimer No results for input(s): DDIMER in the last 72 hours. Hgb A1c No results for input(s): HGBA1C in the last 72 hours. Lipid Profile No results for input(s): CHOL, HDL, LDLCALC, TRIG, CHOLHDL, LDLDIRECT in the last 72 hours. Thyroid function studies No results for input(s): TSH, T4TOTAL, T3FREE, THYROIDAB in the last 72 hours.  Invalid input(s): FREET3 Anemia work up No results for input(s): VITAMINB12, FOLATE, FERRITIN, TIBC, IRON, RETICCTPCT in the last 72 hours. Urinalysis    Component Value Date/Time   COLORURINE AMBER (A) 09/08/2016 1934   APPEARANCEUR HAZY (A) 09/08/2016 1934   LABSPEC 1.025 09/08/2016 1934   PHURINE 5.0 09/08/2016 1934   GLUCOSEU NEGATIVE 09/08/2016 1934   HGBUR MODERATE (A) 09/08/2016 1934   BILIRUBINUR NEGATIVE 09/08/2016 1934   KETONESUR NEGATIVE 09/08/2016 1934   PROTEINUR 100 (A) 09/08/2016 1934   UROBILINOGEN 0.2 05/25/2007 1944   NITRITE NEGATIVE 09/08/2016 1934   LEUKOCYTESUR NEGATIVE 09/08/2016 1934   Sepsis Labs Invalid input(s): PROCALCITONIN,  WBC,  LACTICIDVEN Microbiology Recent Results (from the past 240 hour(s))  Culture, blood (routine x 2)     Status: None   Collection Time: 09/09/16 12:20 AM   Result Value Ref Range Status   Specimen Description BLOOD RIGHT HAND  Final   Special Requests   Final    BOTTLES DRAWN AEROBIC AND ANAEROBIC Blood Culture adequate volume   Culture NO GROWTH 5 DAYS  Final   Report Status 09/14/2016 FINAL  Final  Culture, blood (routine x 2)     Status: None   Collection Time: 09/09/16 12:25 AM  Result Value Ref Range Status   Specimen Description BLOOD RIGHT WRIST  Final   Special Requests   Final    BOTTLES DRAWN AEROBIC AND ANAEROBIC Blood Culture adequate volume   Culture NO GROWTH 5 DAYS  Final   Report Status 09/14/2016 FINAL  Final  Body fluid culture     Status: None   Collection Time: 09/09/16 12:49 PM  Result Value Ref Range Status   Specimen Description FLUID RIGHT HIP  Final   Special Requests Normal  Final   Gram Stain   Final    MODERATE WBC PRESENT, PREDOMINANTLY PMN MODERATE GRAM POSITIVE COCCI IN PAIRS IN CLUSTERS    Culture MODERATE STAPHYLOCOCCUS AUREUS  Final   Report Status 09/12/2016 FINAL  Final   Organism ID, Bacteria STAPHYLOCOCCUS AUREUS  Final      Susceptibility   Staphylococcus aureus - MIC*    CIPROFLOXACIN <=0.5 SENSITIVE Sensitive     ERYTHROMYCIN <=0.25 SENSITIVE Sensitive     GENTAMICIN <=0.5 SENSITIVE Sensitive     OXACILLIN <=0.25 SENSITIVE Sensitive     TETRACYCLINE <=1  SENSITIVE Sensitive     VANCOMYCIN <=0.5 SENSITIVE Sensitive     TRIMETH/SULFA <=10 SENSITIVE Sensitive     CLINDAMYCIN <=0.25 SENSITIVE Sensitive     RIFAMPIN <=0.5 SENSITIVE Sensitive     Inducible Clindamycin NEGATIVE Sensitive     * MODERATE STAPHYLOCOCCUS AUREUS  Aerobic/Anaerobic Culture (surgical/deep wound)     Status: None   Collection Time: 09/09/16  2:17 PM  Result Value Ref Range Status   Specimen Description ABSCESS RIGHT PELVIS  Final   Special Requests Normal  Final   Gram Stain   Final    ABUNDANT WBC PRESENT, PREDOMINANTLY PMN ABUNDANT GRAM POSITIVE COCCI IN CLUSTERS    Culture   Final    ABUNDANT STAPHYLOCOCCUS  AUREUS NO ANAEROBES ISOLATED    Report Status 09/14/2016 FINAL  Final   Organism ID, Bacteria STAPHYLOCOCCUS AUREUS  Final      Susceptibility   Staphylococcus aureus - MIC*    CIPROFLOXACIN <=0.5 SENSITIVE Sensitive     ERYTHROMYCIN <=0.25 SENSITIVE Sensitive     GENTAMICIN <=0.5 SENSITIVE Sensitive     OXACILLIN 0.5 SENSITIVE Sensitive     TETRACYCLINE <=1 SENSITIVE Sensitive     VANCOMYCIN <=0.5 SENSITIVE Sensitive     TRIMETH/SULFA <=10 SENSITIVE Sensitive     CLINDAMYCIN <=0.25 SENSITIVE Sensitive     RIFAMPIN <=0.5 SENSITIVE Sensitive     Inducible Clindamycin NEGATIVE Sensitive     * ABUNDANT STAPHYLOCOCCUS AUREUS  Surgical pcr screen     Status: None   Collection Time: 09/12/16  3:47 PM  Result Value Ref Range Status   MRSA, PCR NEGATIVE NEGATIVE Final   Staphylococcus aureus NEGATIVE NEGATIVE Final    Comment:        The Xpert SA Assay (FDA approved for NASAL specimens in patients over 26 years of age), is one component of a comprehensive surveillance program.  Test performance has been validated by [facility] for patients greater than or equal to 36 year old. It is not intended to diagnose infection nor to guide or monitor treatment.   Aerobic/Anaerobic Culture (surgical/deep wound)     Status: None (Preliminary result)   Collection Time: 09/12/16  6:26 PM  Result Value Ref Range Status   Specimen Description ABSCESS RIGHT HIP  Final   Special Requests NONE  Final   Gram Stain   Final    ABUNDANT WBC PRESENT, PREDOMINANTLY PMN FEW GRAM POSITIVE COCCI IN CLUSTERS    Culture   Final    MODERATE STAPHYLOCOCCUS AUREUS SUSCEPTIBILITIES PERFORMED ON PREVIOUS CULTURE WITHIN THE LAST 5 DAYS. NO ANAEROBES ISOLATED; CULTURE IN PROGRESS FOR 5 DAYS    Report Status PENDING  Incomplete     SIGNED:   Darryle Dennie, Orpah Melter, MD  [facility] 09/15/2016, 4:15 PM  If 7PM-7AM, please contact night-coverage [email or web address] Password [email or web address]

## 2016-09-15 NOTE — Progress Notes (Signed)
Physical Therapy Treatment Patient Details Name: Jon Choi MRN: [id number] DOB: 22-Feb-1950 Today's Date: 09/15/2016    History of Present Illness [name] is a 66 y.o. male with a history of a hip replacement in 2007; hypertension, hyperlipidemia and back pain due to disc protrusion came in with worsening back pain and BLE numbness. He does typically get injections in the back at the L4-5 region due to pain. CT scan here noted a loculated abscess and a drain was placed on 7/28 with culture positive for MSSA.  He has had some intermittent confusion suspected to be from pain medications. Pt is s/p I&D of Right HIP WITH FEMORAL HEAD AND ACETABULAR CUP EXCHANGE.    PT Comments    Pt presented supine in bed with HOB elevated, awake and willing to participate in therapy session. Pt seen for mobility progression, making slow progress towards achieving his current functional goals. Pt continues to require heavy physical assistance with bed mobility, transfers and ambulating a short distance with RW. Pt would continue to benefit from skilled physical therapy services at this time while admitted and after d/c to address the below listed limitations in order to improve overall safety and independence with functional mobility.    Follow Up Recommendations  CIR     Equipment Recommendations  None recommended by PT    Recommendations for Other Services Rehab consult     Precautions / Restrictions Precautions Precautions: Posterior Hip;Fall Precaution Booklet Issued: Yes (comment) Precaution Comments: Posterior hip precautions and PWB 50% Rt LE Restrictions Weight Bearing Restrictions: Yes RLE Weight Bearing: Partial weight bearing RLE Partial Weight Bearing Percentage or Pounds: 50%    Mobility  Bed Mobility Overal bed mobility: Needs Assistance Bed Mobility: Supine to Sit     Supine to sit: Mod assist;+2 for physical assistance     General bed mobility comments: increased  time and effort, heavy use of bed rails, assist with R LE movement off of bed and to elevate trunk to achieve sitting EOB  Transfers Overall transfer level: Needs assistance Equipment used: Rolling walker (2 wheeled) Transfers: Sit to/from Stand Sit to Stand: +2 physical assistance;Mod assist         General transfer comment: pt required increased time and continued vc'ing for sequecing. Pt appeared to be maintining PWB status  Ambulation/Gait Ambulation/Gait assistance: Mod assist Ambulation Distance (Feet): 20 Feet Assistive device: Rolling walker (2 wheeled) Gait Pattern/deviations: Step-to pattern;Decreased step length - left;Decreased step length - right;Decreased stride length;Decreased weight shift to right;Decreased dorsiflexion - right;Trunk flexed Gait velocity: decreased Gait velocity interpretation: Below normal speed for age/gender General Gait Details: pt appearing to maintain PWB'ing R LE throughout, vc'ing required for sequencing with RW.    Stairs            Wheelchair Mobility    Modified Rankin (Stroke Patients Only)       Balance Overall balance assessment: Needs assistance Sitting-balance support: Feet supported Sitting balance-Leahy Scale: Fair     Standing balance support: Bilateral upper extremity supported;During functional activity Standing balance-Leahy Scale: Poor Standing balance comment: Heavy reliance on RW for UE support                             Cognition Arousal/Alertness: Awake/alert Behavior During Therapy: WFL for tasks assessed/performed Overall Cognitive Status: Impaired/Different from baseline Area of Impairment: Memory;Following commands;Safety/judgement;Problem solving  Memory: Decreased recall of precautions;Decreased short-term memory Following Commands: Follows one step commands with increased time Safety/Judgement: Decreased awareness of safety;Decreased awareness of  deficits Awareness: Emergent Problem Solving: Slow processing;Decreased initiation;Difficulty sequencing        Exercises      General Comments        Pertinent Vitals/Pain Pain Assessment: Faces Faces Pain Scale: Hurts even more Pain Location: Rt hip Pain Descriptors / Indicators: Discomfort;Grimacing Pain Intervention(s): Monitored during session;Repositioned    Home Living                      Prior Function            PT Goals (current goals can now be found in the care plan section) Acute Rehab PT Goals PT Goal Formulation: With patient Time For Goal Achievement: 09/27/16 Potential to Achieve Goals: Good Progress towards PT goals: Progressing toward goals    Frequency    Min 3X/week      PT Plan Current plan remains appropriate    Co-evaluation              AM-PAC PT "6 Clicks" Daily Activity  Outcome Measure  Difficulty turning over in bed (including adjusting bedclothes, sheets and blankets)?: Total Difficulty moving from lying on back to sitting on the side of the bed? : Total Difficulty sitting down on and standing up from a chair with arms (e.g., wheelchair, bedside commode, etc,.)?: Total Help needed moving to and from a bed to chair (including a wheelchair)?: A Lot Help needed walking in hospital room?: A Lot Help needed climbing 3-5 steps with a railing? : A Lot 6 Click Score: 9    End of Session Equipment Utilized During Treatment: Gait belt Activity Tolerance: Patient limited by pain;Patient limited by fatigue Patient left: in chair;with call bell/phone within reach;with chair alarm set;with family/visitor present Nurse Communication: Mobility status PT Visit Diagnosis: Unsteadiness on feet (R26.81);Other abnormalities of gait and mobility (R26.89);Muscle weakness (generalized) (M62.81);Difficulty in walking, not elsewhere classified (R26.2);Pain Pain - Right/Left: Right Pain - part of body: Hip     Time: 6015-6153 PT  Time Calculation (min) (ACUTE ONLY): 28 min  Charges:  $Gait Training: 8-22 mins $Therapeutic Activity: 8-22 mins                    G Codes:       [place], [place], [place] 09/15/2016, 3:31 PM

## 2016-09-15 NOTE — Progress Notes (Signed)
Date of Admission:  09/08/2016     Recommendations: - Continue IV cefazolin 2g q8h for 6 weeks (7/30 - 9/10) to treat prosthetic joint infection with MSSA. - Add on PO rifampin 300 mg bid for 6 weeks (8/3 - 9/14) to allow for better biofilm penetration in setting of retained prosthetic component - Repeat CT pelvis at 4 weeks to assess treatment response - Follow up with ID clinic in 4-6 weeks. Patient will continue oral antibiotics for 6-12 months.    ASSESSMENT: Jon Choi a 66 y.o.malewith a history of a hip replacement in 2007 and back pain due to disc protrusion came in with worsening back pain. He had fallen about one week before presentation. He typically gets corticosteroid injections in the back at the L4-5 region due to pain. CT scan here noted a loculated abscess and a drain was placed on 7/28 with culture positive for MSSA. He has had some intermittent confusion suspected to be from pain medications which has since improved. Patient has been afebrile and hemodynamically stable during admission.  I&D of R hip was performed (7/31) and acetabular component of prosthetic hip removed with spacer placed, however the femoral prosthesis component was retained per Dr. Alvan Dame, for which the patient will need a longer course of antibiotics (6-12 months) to treat prosthetic joint infection.    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 ferrous sulfate  325 mg Oral TID PC  . mouth rinse  15 mL Mouth Rinse BID  . metoprolol succinate  50 mg Oral Daily  . nicotine  14 mg Transdermal Daily  . pantoprazole  40 mg Oral Daily  . simvastatin  20 mg Oral Daily  . sodium chloride flush  5 mL Intravenous Q8H  . temazepam  30  mg Oral QHS    SUBJECTIVE: No acute events. Patient is conversant and demonstrates improvement in confusion. Patient denies chills, abdominal pain, chest pain, shortness of breath. He has been sitting up in chair and reports eating well. Endorses baseline back pain and some R hip pain that is not worsening.  Review of Systems: Review of Systems  All other systems reviewed and are negative.   No Known Allergies  OBJECTIVE: Vitals:   09/15/16 0050 09/15/16 0415 09/15/16 0852 09/15/16 1000  BP: 122/65 122/67 130/64 133/71  Pulse: 94 85 96 98  Resp: 18 18  20   Temp: 97.7 F (36.5 C) 98.1 F (36.7 C)  97.6 F (36.4 C)  TempSrc: Oral Oral  Oral  SpO2: 99% 98%  99%  Weight:      Height:       Body mass index is 23.87 kg/m.  Physical Exam  Constitutional: He is well-developed, well-nourished, and in no distress.  Eyes: Pupils are equal, round, and reactive to light. Conjunctivae are normal. No scleral icterus.  Neck: Neck supple.  Cardiovascular: Normal rate, regular rhythm and normal heart sounds.   No murmur heard. Pulmonary/Chest: Effort normal and breath sounds normal.  Abdominal: Soft. Bowel sounds are normal. He exhibits no distension. There is no tenderness.  Musculoskeletal: He exhibits no edema.  Neurological: He is alert.  Skin: Skin is warm and dry. No rash noted. No erythema.  Psychiatric: Affect  normal.    Lab Results Lab Results  Component Value Date   WBC 9.9 09/15/2016   HGB 8.5 (L) 09/15/2016   HCT 25.8 (L) 09/15/2016   MCV 95.6 09/15/2016   PLT 359 09/15/2016    Lab Results  Component Value Date   CREATININE 0.74 09/15/2016   BUN 12 09/15/2016   NA 132 (L) 09/15/2016   K 3.5 09/15/2016   CL 101 09/15/2016   CO2 25 09/15/2016    Lab Results  Component Value Date   ALT 31 09/09/2016   AST 41 09/09/2016   ALKPHOS 72 09/09/2016   BILITOT 1.0 09/09/2016     Microbiology: Recent Results (from the past 240 hour(s))  Culture, blood (routine x  2)     Status: None   Collection Time: 09/09/16 12:20 AM  Result Value Ref Range Status   Specimen Description BLOOD RIGHT HAND  Final   Special Requests   Final    BOTTLES DRAWN AEROBIC AND ANAEROBIC Blood Culture adequate volume   Culture NO GROWTH 5 DAYS  Final   Report Status 09/14/2016 FINAL  Final  Culture, blood (routine x 2)     Status: None   Collection Time: 09/09/16 12:25 AM  Result Value Ref Range Status   Specimen Description BLOOD RIGHT WRIST  Final   Special Requests   Final    BOTTLES DRAWN AEROBIC AND ANAEROBIC Blood Culture adequate volume   Culture NO GROWTH 5 DAYS  Final   Report Status 09/14/2016 FINAL  Final  Body fluid culture     Status: None   Collection Time: 09/09/16 12:49 PM  Result Value Ref Range Status   Specimen Description FLUID RIGHT HIP  Final   Special Requests Normal  Final   Gram Stain   Final    MODERATE WBC PRESENT, PREDOMINANTLY PMN MODERATE GRAM POSITIVE COCCI IN PAIRS IN CLUSTERS    Culture MODERATE STAPHYLOCOCCUS AUREUS  Final   Report Status 09/12/2016 FINAL  Final   Organism ID, Bacteria STAPHYLOCOCCUS AUREUS  Final      Susceptibility   Staphylococcus aureus - MIC*    CIPROFLOXACIN <=0.5 SENSITIVE Sensitive     ERYTHROMYCIN <=0.25 SENSITIVE Sensitive     GENTAMICIN <=0.5 SENSITIVE Sensitive     OXACILLIN <=0.25 SENSITIVE Sensitive     TETRACYCLINE <=1 SENSITIVE Sensitive     VANCOMYCIN <=0.5 SENSITIVE Sensitive     TRIMETH/SULFA <=10 SENSITIVE Sensitive     CLINDAMYCIN <=0.25 SENSITIVE Sensitive     RIFAMPIN <=0.5 SENSITIVE Sensitive     Inducible Clindamycin NEGATIVE Sensitive     * MODERATE STAPHYLOCOCCUS AUREUS  Aerobic/Anaerobic Culture (surgical/deep wound)     Status: None   Collection Time: 09/09/16  2:17 PM  Result Value Ref Range Status   Specimen Description ABSCESS RIGHT PELVIS  Final   Special Requests Normal  Final   Gram Stain   Final    ABUNDANT WBC PRESENT, PREDOMINANTLY PMN ABUNDANT GRAM POSITIVE COCCI IN  CLUSTERS    Culture   Final    ABUNDANT STAPHYLOCOCCUS AUREUS NO ANAEROBES ISOLATED    Report Status 09/14/2016 FINAL  Final   Organism ID, Bacteria STAPHYLOCOCCUS AUREUS  Final      Susceptibility   Staphylococcus aureus - MIC*    CIPROFLOXACIN <=0.5 SENSITIVE Sensitive     ERYTHROMYCIN <=0.25 SENSITIVE Sensitive     GENTAMICIN <=0.5 SENSITIVE Sensitive     OXACILLIN 0.5 SENSITIVE Sensitive     TETRACYCLINE <=1 SENSITIVE Sensitive  VANCOMYCIN <=0.5 SENSITIVE Sensitive     TRIMETH/SULFA <=10 SENSITIVE Sensitive     CLINDAMYCIN <=0.25 SENSITIVE Sensitive     RIFAMPIN <=0.5 SENSITIVE Sensitive     Inducible Clindamycin NEGATIVE Sensitive     * ABUNDANT STAPHYLOCOCCUS AUREUS  Surgical pcr screen     Status: None   Collection Time: 09/12/16  3:47 PM  Result Value Ref Range Status   MRSA, PCR NEGATIVE NEGATIVE Final   Staphylococcus aureus NEGATIVE NEGATIVE Final    Comment:        The Xpert SA Assay (FDA approved for NASAL specimens in patients over 41 years of age), is one component of a comprehensive surveillance program.  Test performance has been validated by [facility] for patients greater than or equal to 33 year old. It is not intended to diagnose infection nor to guide or monitor treatment.   Aerobic/Anaerobic Culture (surgical/deep wound)     Status: None (Preliminary result)   Collection Time: 09/12/16  6:26 PM  Result Value Ref Range Status   Specimen Description ABSCESS RIGHT HIP  Final   Special Requests NONE  Final   Gram Stain   Final    ABUNDANT WBC PRESENT, PREDOMINANTLY PMN FEW GRAM POSITIVE COCCI IN CLUSTERS    Culture CULTURE REINCUBATED FOR BETTER GROWTH  Final   Report Status PENDING  Incomplete    Will Aydeen Blume, MS4 09/15/2016, 11:36 AM

## 2016-09-15 NOTE — Progress Notes (Signed)
Plan is for d/c home when medically ready. CM spoke to patients wife and she would like a bed for home. CM also spoke to patient and he would like a wheelchair and trapeze for the bed. MD updated.  CM inquired with Mrs Splawn about the [facility] agency for home and she asked to use [facility]. Pam with IV therapy informed. Pam is to meet with the patient's wife at 2 pm.  CM informed Santiago Glad with [facility] about the equipment needs.  CM following.

## 2016-09-15 NOTE — Progress Notes (Signed)
Rehab admissions - Noted patient prefers to go home rather than come to inpatient rehab.  Currently rehab beds are full.  Call me for questions.  #[phone]

## 2016-09-15 NOTE — Progress Notes (Signed)
Referring Physician(s):  Dr. Debbe Odea  Supervising Physician: Markus Daft  Patient Status:  [facility] - In-pt  Chief Complaint:  Right iliacus abscess s/p drain placement 7/28 by Dr. Vernard Gambles.   Subjective: Case management with patient discussing home needs.  Patient stable.    Allergies: Patient has no known allergies.  Medications: Prior to Admission medications   Medication Sig Start Date End Date Taking? Authorizing Provider  celecoxib (CELEBREX) 200 MG capsule Take 200 mg by mouth daily.  08/19/16  Yes [provider]  diazepam (VALIUM) 5 MG tablet Take 1 tablet (5 mg total) by mouth every 8 (eight) hours as needed for muscle spasms. 09/03/16  Yes Virgel Manifold, MD  metoprolol succinate (TOPROL-XL) 50 MG 24 hr tablet Take 50 mg by mouth daily.    Yes [provider]  naproxen sodium (ANAPROX) 220 MG tablet Take 440 mg by mouth daily as needed (pain).   Yes [provider]  oxyCODONE-acetaminophen (PERCOCET/ROXICET) 5-325 MG tablet Take 1-2 tablets by mouth every 6 (six) hours as needed for severe pain. Patient taking differently: Take 1 tablet by mouth every 8 (eight) hours as needed for severe pain.  09/03/16  Yes Virgel Manifold, MD  simvastatin (ZOCOR) 20 MG tablet Take 20 mg by mouth daily.   Yes [provider]  temazepam (RESTORIL) 30 MG capsule Take 30 mg by mouth at bedtime.  08/24/16  Yes [provider]  tiZANidine (ZANAFLEX) 4 MG tablet Take 1 tablet (4 mg total) by mouth every 8 (eight) hours as needed for muscle spasms. 08/30/16  Yes Mcarthur Rossetti, MD  traMADol (ULTRAM) 50 MG tablet TAKE 1 TO 2 TABLETS BY MOUTH TWICE A DAY AS NEEDED FOR PAIN. Patient taking differently: TAKE 50MG -100MG  BY MOUTH EVERY 8 HOURS AS NEEDED FOR PAIN. 09/08/16  Yes Mcarthur Rossetti, MD  HYDROcodone-acetaminophen (NORCO) 10-325 MG tablet TAKE 1 TABLET BY MOUTH EVERY 6 HOURS AS NEEDED. 09/11/16   Mcarthur Rossetti, MD     Vital  Signs: BP 133/71 (BP Location: Left Arm)   Pulse 98   Temp 97.6 F (36.4 C) (Oral)   Resp 20   Ht 6\' 1"  (1.854 m)   Wt 180 lb 14.4 oz (82.1 kg)   SpO2 99%   BMI 23.87 kg/m   Physical Exam  Constitutional: He appears well-developed.  Cardiovascular: Normal rate, regular rhythm and normal heart sounds.   Pulmonary/Chest: Effort normal and breath sounds normal. No respiratory distress.  Musculoskeletal:  Pelvic drain in place with bloody output.  65 mL output yesterday. Insertion site intact, clean, and dry.  Nontender to palpation.   Nursing note and vitals reviewed.   NAD, alert MSK: right pelvic abscess drain in place with bloody output.  Insertion site with dry blood, but appears intact and without erythema or warmth.   Imaging: Dg Hip Unilat With Pelvis 2-3 Views Right  Result Date: 09/11/2016 CLINICAL DATA:  Right hip pain.  No known injury. EXAM: DG HIP (WITH OR WITHOUT PELVIS) 2-3V RIGHT COMPARISON:  CT pelvis for abscess drain catheter placement 09/09/2016. CT pelvis 09/08/2016 FINDINGS: Postoperative left hip hemiarthroplasty with non cemented components. Components appear well seated. No evidence of acute fracture or dislocation in the pelvis or right hip. Pigtail drainage catheter projected over the right pelvis. SI joints and symphysis pubis are not displaced. Left hip demonstrates degenerative changes. IMPRESSION: Right hip hemiarthroplasty appears well seated. No acute fracture or dislocation. Drainage catheter projected over the right pelvis. Electronically  Signed   By: Lucienne Capers M.D.   On: 09/11/2016 23:50    Labs:  CBC:  Recent Labs  09/12/16 0302 09/13/16 0752 09/14/16 0355 09/15/16 0509  WBC 11.9* 12.6* 13.1* 9.9  HGB 12.5* 10.7* 9.7* 8.5*  HCT 37.2* 32.0* 29.3* 25.8*  PLT 361 382 395 359    COAGS: No results for input(s): INR, APTT in the last 8760 hours.  BMP:  Recent Labs  09/12/16 0302 09/13/16 0752 09/14/16 0355 09/15/16 0509  NA  130* 130* 132* 132*  K 3.8 4.0 3.5 3.5  CL 97* 98* 101 101  CO2 25 23 25 25   GLUCOSE 117* 119* 106* 92  BUN 20 17 20 12   CALCIUM 8.5* 8.0* 8.1* 7.8*  CREATININE 0.84 0.75 0.89 0.74  GFRNONAA >60 >60 >60 >60  GFRAA >60 >60 >60 >60    LIVER FUNCTION TESTS:  Recent Labs  09/08/16 1926 09/09/16 0359  BILITOT 1.2 1.0  AST 47* 41  ALT 35 31  ALKPHOS 73 72  PROT 6.9 6.6  ALBUMIN 2.7* 2.3*    Assessment and Plan: Right iliacus abscess s/p pelvic drain placement 7/28 by Dr. Fabio Bering remains in place with bloody output- possibly more serous today. WBC improved to 9.9 today. ID following; plan is to d/c home with IV abx once ready. Continue drain care.   Electronically Signed: Docia Barrier, PA 09/15/2016, 11:12 AM   I spent a total of 15 Minutes at the the patient's bedside AND on the patient's hospital floor or unit, greater than 50% of which was counseling/coordinating care for iliacus abscess.

## 2016-09-16 DIAGNOSIS — T8459XA Infection and inflammatory reaction due to other internal joint prosthesis, initial encounter: Secondary | ICD-10-CM | POA: Diagnosis not present

## 2016-09-16 DIAGNOSIS — Z452 Encounter for adjustment and management of vascular access device: Secondary | ICD-10-CM | POA: Diagnosis not present

## 2016-09-16 DIAGNOSIS — Z792 Long term (current) use of antibiotics: Secondary | ICD-10-CM | POA: Diagnosis not present

## 2016-09-16 DIAGNOSIS — A419 Sepsis, unspecified organism: Secondary | ICD-10-CM | POA: Diagnosis not present

## 2016-09-16 LAB — HEPATITIS B SURFACE ANTIGEN

## 2016-09-18 DIAGNOSIS — Z792 Long term (current) use of antibiotics: Secondary | ICD-10-CM | POA: Diagnosis not present

## 2016-09-18 DIAGNOSIS — Z452 Encounter for adjustment and management of vascular access device: Secondary | ICD-10-CM | POA: Diagnosis not present

## 2016-09-18 DIAGNOSIS — K6812 Psoas muscle abscess: Secondary | ICD-10-CM | POA: Diagnosis not present

## 2016-09-19 DIAGNOSIS — N179 Acute kidney failure, unspecified: Secondary | ICD-10-CM | POA: Diagnosis not present

## 2016-09-19 DIAGNOSIS — Z792 Long term (current) use of antibiotics: Secondary | ICD-10-CM | POA: Diagnosis not present

## 2016-09-19 DIAGNOSIS — K6812 Psoas muscle abscess: Secondary | ICD-10-CM | POA: Diagnosis not present

## 2016-09-19 DIAGNOSIS — M48061 Spinal stenosis, lumbar region without neurogenic claudication: Secondary | ICD-10-CM | POA: Diagnosis not present

## 2016-09-19 DIAGNOSIS — A4101 Sepsis due to Methicillin susceptible Staphylococcus aureus: Secondary | ICD-10-CM | POA: Diagnosis not present

## 2016-09-19 DIAGNOSIS — S32050D Wedge compression fracture of fifth lumbar vertebra, subsequent encounter for fracture with routine healing: Secondary | ICD-10-CM | POA: Diagnosis not present

## 2016-09-19 DIAGNOSIS — Z5181 Encounter for therapeutic drug level monitoring: Secondary | ICD-10-CM | POA: Diagnosis not present

## 2016-09-19 DIAGNOSIS — Z452 Encounter for adjustment and management of vascular access device: Secondary | ICD-10-CM | POA: Diagnosis not present

## 2016-09-19 DIAGNOSIS — T8451XA Infection and inflammatory reaction due to internal right hip prosthesis, initial encounter: Secondary | ICD-10-CM | POA: Diagnosis not present

## 2016-09-19 DIAGNOSIS — I10 Essential (primary) hypertension: Secondary | ICD-10-CM | POA: Diagnosis not present

## 2016-09-19 DIAGNOSIS — Z85828 Personal history of other malignant neoplasm of skin: Secondary | ICD-10-CM | POA: Diagnosis not present

## 2016-09-19 DIAGNOSIS — E785 Hyperlipidemia, unspecified: Secondary | ICD-10-CM | POA: Diagnosis not present

## 2016-09-20 DIAGNOSIS — Z792 Long term (current) use of antibiotics: Secondary | ICD-10-CM | POA: Diagnosis not present

## 2016-09-20 DIAGNOSIS — Z452 Encounter for adjustment and management of vascular access device: Secondary | ICD-10-CM | POA: Diagnosis not present

## 2016-09-20 LAB — AEROBIC/ANAEROBIC CULTURE (SURGICAL/DEEP WOUND)

## 2016-09-21 DIAGNOSIS — Z452 Encounter for adjustment and management of vascular access device: Secondary | ICD-10-CM | POA: Diagnosis not present

## 2016-09-21 DIAGNOSIS — T8451XA Infection and inflammatory reaction due to internal right hip prosthesis, initial encounter: Secondary | ICD-10-CM | POA: Diagnosis not present

## 2016-09-21 DIAGNOSIS — Z792 Long term (current) use of antibiotics: Secondary | ICD-10-CM | POA: Diagnosis not present

## 2016-09-21 DIAGNOSIS — K6812 Psoas muscle abscess: Secondary | ICD-10-CM | POA: Diagnosis not present

## 2016-09-21 DIAGNOSIS — Z5181 Encounter for therapeutic drug level monitoring: Secondary | ICD-10-CM | POA: Diagnosis not present

## 2016-09-21 DIAGNOSIS — I10 Essential (primary) hypertension: Secondary | ICD-10-CM | POA: Diagnosis not present

## 2016-09-21 DIAGNOSIS — N179 Acute kidney failure, unspecified: Secondary | ICD-10-CM | POA: Diagnosis not present

## 2016-09-21 DIAGNOSIS — S32050D Wedge compression fracture of fifth lumbar vertebra, subsequent encounter for fracture with routine healing: Secondary | ICD-10-CM | POA: Diagnosis not present

## 2016-09-21 DIAGNOSIS — Z85828 Personal history of other malignant neoplasm of skin: Secondary | ICD-10-CM | POA: Diagnosis not present

## 2016-09-21 DIAGNOSIS — M48061 Spinal stenosis, lumbar region without neurogenic claudication: Secondary | ICD-10-CM | POA: Diagnosis not present

## 2016-09-21 DIAGNOSIS — E785 Hyperlipidemia, unspecified: Secondary | ICD-10-CM | POA: Diagnosis not present

## 2016-09-21 DIAGNOSIS — A4101 Sepsis due to Methicillin susceptible Staphylococcus aureus: Secondary | ICD-10-CM | POA: Diagnosis not present

## 2016-09-22 DIAGNOSIS — Z452 Encounter for adjustment and management of vascular access device: Secondary | ICD-10-CM | POA: Diagnosis not present

## 2016-09-22 DIAGNOSIS — Z792 Long term (current) use of antibiotics: Secondary | ICD-10-CM | POA: Diagnosis not present

## 2016-09-25 DIAGNOSIS — Z452 Encounter for adjustment and management of vascular access device: Secondary | ICD-10-CM | POA: Diagnosis not present

## 2016-09-25 DIAGNOSIS — Z792 Long term (current) use of antibiotics: Secondary | ICD-10-CM | POA: Diagnosis not present

## 2016-09-25 DIAGNOSIS — N179 Acute kidney failure, unspecified: Secondary | ICD-10-CM | POA: Diagnosis not present

## 2016-09-25 DIAGNOSIS — K6812 Psoas muscle abscess: Secondary | ICD-10-CM | POA: Diagnosis not present

## 2016-09-26 DIAGNOSIS — A4101 Sepsis due to Methicillin susceptible Staphylococcus aureus: Secondary | ICD-10-CM | POA: Diagnosis not present

## 2016-09-26 DIAGNOSIS — Z85828 Personal history of other malignant neoplasm of skin: Secondary | ICD-10-CM | POA: Diagnosis not present

## 2016-09-26 DIAGNOSIS — I10 Essential (primary) hypertension: Secondary | ICD-10-CM | POA: Diagnosis not present

## 2016-09-26 DIAGNOSIS — T8451XA Infection and inflammatory reaction due to internal right hip prosthesis, initial encounter: Secondary | ICD-10-CM | POA: Diagnosis not present

## 2016-09-26 DIAGNOSIS — Z5181 Encounter for therapeutic drug level monitoring: Secondary | ICD-10-CM | POA: Diagnosis not present

## 2016-09-26 DIAGNOSIS — N179 Acute kidney failure, unspecified: Secondary | ICD-10-CM | POA: Diagnosis not present

## 2016-09-26 DIAGNOSIS — Z792 Long term (current) use of antibiotics: Secondary | ICD-10-CM | POA: Diagnosis not present

## 2016-09-26 DIAGNOSIS — K6812 Psoas muscle abscess: Secondary | ICD-10-CM | POA: Diagnosis not present

## 2016-09-26 DIAGNOSIS — Z452 Encounter for adjustment and management of vascular access device: Secondary | ICD-10-CM | POA: Diagnosis not present

## 2016-09-26 DIAGNOSIS — M48061 Spinal stenosis, lumbar region without neurogenic claudication: Secondary | ICD-10-CM | POA: Diagnosis not present

## 2016-09-26 DIAGNOSIS — E785 Hyperlipidemia, unspecified: Secondary | ICD-10-CM | POA: Diagnosis not present

## 2016-09-26 DIAGNOSIS — S32050D Wedge compression fracture of fifth lumbar vertebra, subsequent encounter for fracture with routine healing: Secondary | ICD-10-CM | POA: Diagnosis not present

## 2016-09-27 DIAGNOSIS — Z792 Long term (current) use of antibiotics: Secondary | ICD-10-CM | POA: Diagnosis not present

## 2016-09-27 DIAGNOSIS — Z452 Encounter for adjustment and management of vascular access device: Secondary | ICD-10-CM | POA: Diagnosis not present

## 2016-09-27 DIAGNOSIS — A419 Sepsis, unspecified organism: Secondary | ICD-10-CM | POA: Diagnosis not present

## 2016-09-27 DIAGNOSIS — T8459XA Infection and inflammatory reaction due to other internal joint prosthesis, initial encounter: Secondary | ICD-10-CM | POA: Diagnosis not present

## 2016-09-28 DIAGNOSIS — A4101 Sepsis due to Methicillin susceptible Staphylococcus aureus: Secondary | ICD-10-CM | POA: Diagnosis not present

## 2016-09-28 DIAGNOSIS — Z5181 Encounter for therapeutic drug level monitoring: Secondary | ICD-10-CM | POA: Diagnosis not present

## 2016-09-28 DIAGNOSIS — Z85828 Personal history of other malignant neoplasm of skin: Secondary | ICD-10-CM | POA: Diagnosis not present

## 2016-09-28 DIAGNOSIS — I10 Essential (primary) hypertension: Secondary | ICD-10-CM | POA: Diagnosis not present

## 2016-09-28 DIAGNOSIS — T8451XA Infection and inflammatory reaction due to internal right hip prosthesis, initial encounter: Secondary | ICD-10-CM | POA: Diagnosis not present

## 2016-09-28 DIAGNOSIS — Z452 Encounter for adjustment and management of vascular access device: Secondary | ICD-10-CM | POA: Diagnosis not present

## 2016-09-28 DIAGNOSIS — M48061 Spinal stenosis, lumbar region without neurogenic claudication: Secondary | ICD-10-CM | POA: Diagnosis not present

## 2016-09-28 DIAGNOSIS — Z792 Long term (current) use of antibiotics: Secondary | ICD-10-CM | POA: Diagnosis not present

## 2016-09-28 DIAGNOSIS — E785 Hyperlipidemia, unspecified: Secondary | ICD-10-CM | POA: Diagnosis not present

## 2016-09-28 DIAGNOSIS — K6812 Psoas muscle abscess: Secondary | ICD-10-CM | POA: Diagnosis not present

## 2016-09-28 DIAGNOSIS — S32050D Wedge compression fracture of fifth lumbar vertebra, subsequent encounter for fracture with routine healing: Secondary | ICD-10-CM | POA: Diagnosis not present

## 2016-09-28 DIAGNOSIS — N179 Acute kidney failure, unspecified: Secondary | ICD-10-CM | POA: Diagnosis not present

## 2016-10-02 DIAGNOSIS — Z452 Encounter for adjustment and management of vascular access device: Secondary | ICD-10-CM | POA: Diagnosis not present

## 2016-10-02 DIAGNOSIS — Z792 Long term (current) use of antibiotics: Secondary | ICD-10-CM | POA: Diagnosis not present

## 2016-10-02 DIAGNOSIS — T8451XA Infection and inflammatory reaction due to internal right hip prosthesis, initial encounter: Secondary | ICD-10-CM | POA: Diagnosis not present

## 2016-10-03 DIAGNOSIS — M48061 Spinal stenosis, lumbar region without neurogenic claudication: Secondary | ICD-10-CM | POA: Diagnosis not present

## 2016-10-03 DIAGNOSIS — T8451XD Infection and inflammatory reaction due to internal right hip prosthesis, subsequent encounter: Secondary | ICD-10-CM | POA: Diagnosis not present

## 2016-10-03 DIAGNOSIS — A419 Sepsis, unspecified organism: Secondary | ICD-10-CM | POA: Diagnosis not present

## 2016-10-03 DIAGNOSIS — K6812 Psoas muscle abscess: Secondary | ICD-10-CM | POA: Diagnosis not present

## 2016-10-04 DIAGNOSIS — Z792 Long term (current) use of antibiotics: Secondary | ICD-10-CM | POA: Diagnosis not present

## 2016-10-04 DIAGNOSIS — Z85828 Personal history of other malignant neoplasm of skin: Secondary | ICD-10-CM | POA: Diagnosis not present

## 2016-10-04 DIAGNOSIS — K6812 Psoas muscle abscess: Secondary | ICD-10-CM | POA: Diagnosis not present

## 2016-10-04 DIAGNOSIS — N179 Acute kidney failure, unspecified: Secondary | ICD-10-CM | POA: Diagnosis not present

## 2016-10-04 DIAGNOSIS — A4101 Sepsis due to Methicillin susceptible Staphylococcus aureus: Secondary | ICD-10-CM | POA: Diagnosis not present

## 2016-10-04 DIAGNOSIS — I10 Essential (primary) hypertension: Secondary | ICD-10-CM | POA: Diagnosis not present

## 2016-10-04 DIAGNOSIS — T8451XA Infection and inflammatory reaction due to internal right hip prosthesis, initial encounter: Secondary | ICD-10-CM | POA: Diagnosis not present

## 2016-10-04 DIAGNOSIS — S32050D Wedge compression fracture of fifth lumbar vertebra, subsequent encounter for fracture with routine healing: Secondary | ICD-10-CM | POA: Diagnosis not present

## 2016-10-04 DIAGNOSIS — M48061 Spinal stenosis, lumbar region without neurogenic claudication: Secondary | ICD-10-CM | POA: Diagnosis not present

## 2016-10-04 DIAGNOSIS — Z5181 Encounter for therapeutic drug level monitoring: Secondary | ICD-10-CM | POA: Diagnosis not present

## 2016-10-04 DIAGNOSIS — E785 Hyperlipidemia, unspecified: Secondary | ICD-10-CM | POA: Diagnosis not present

## 2016-10-04 DIAGNOSIS — Z452 Encounter for adjustment and management of vascular access device: Secondary | ICD-10-CM | POA: Diagnosis not present

## 2016-10-06 DIAGNOSIS — Z792 Long term (current) use of antibiotics: Secondary | ICD-10-CM | POA: Diagnosis not present

## 2016-10-06 DIAGNOSIS — K6812 Psoas muscle abscess: Secondary | ICD-10-CM | POA: Diagnosis not present

## 2016-10-06 DIAGNOSIS — E785 Hyperlipidemia, unspecified: Secondary | ICD-10-CM | POA: Diagnosis not present

## 2016-10-06 DIAGNOSIS — Z452 Encounter for adjustment and management of vascular access device: Secondary | ICD-10-CM | POA: Diagnosis not present

## 2016-10-06 DIAGNOSIS — Z85828 Personal history of other malignant neoplasm of skin: Secondary | ICD-10-CM | POA: Diagnosis not present

## 2016-10-06 DIAGNOSIS — S32050D Wedge compression fracture of fifth lumbar vertebra, subsequent encounter for fracture with routine healing: Secondary | ICD-10-CM | POA: Diagnosis not present

## 2016-10-06 DIAGNOSIS — T8451XA Infection and inflammatory reaction due to internal right hip prosthesis, initial encounter: Secondary | ICD-10-CM | POA: Diagnosis not present

## 2016-10-06 DIAGNOSIS — M48061 Spinal stenosis, lumbar region without neurogenic claudication: Secondary | ICD-10-CM | POA: Diagnosis not present

## 2016-10-06 DIAGNOSIS — Z5181 Encounter for therapeutic drug level monitoring: Secondary | ICD-10-CM | POA: Diagnosis not present

## 2016-10-06 DIAGNOSIS — N179 Acute kidney failure, unspecified: Secondary | ICD-10-CM | POA: Diagnosis not present

## 2016-10-06 DIAGNOSIS — I10 Essential (primary) hypertension: Secondary | ICD-10-CM | POA: Diagnosis not present

## 2016-10-06 DIAGNOSIS — A4101 Sepsis due to Methicillin susceptible Staphylococcus aureus: Secondary | ICD-10-CM | POA: Diagnosis not present

## 2016-10-09 DIAGNOSIS — T8451XA Infection and inflammatory reaction due to internal right hip prosthesis, initial encounter: Secondary | ICD-10-CM | POA: Diagnosis not present

## 2016-10-09 DIAGNOSIS — Z452 Encounter for adjustment and management of vascular access device: Secondary | ICD-10-CM | POA: Diagnosis not present

## 2016-10-09 DIAGNOSIS — Z792 Long term (current) use of antibiotics: Secondary | ICD-10-CM | POA: Diagnosis not present

## 2016-10-11 DIAGNOSIS — N179 Acute kidney failure, unspecified: Secondary | ICD-10-CM | POA: Diagnosis not present

## 2016-10-11 DIAGNOSIS — E785 Hyperlipidemia, unspecified: Secondary | ICD-10-CM | POA: Diagnosis not present

## 2016-10-11 DIAGNOSIS — Z452 Encounter for adjustment and management of vascular access device: Secondary | ICD-10-CM | POA: Diagnosis not present

## 2016-10-11 DIAGNOSIS — T8459XA Infection and inflammatory reaction due to other internal joint prosthesis, initial encounter: Secondary | ICD-10-CM | POA: Diagnosis not present

## 2016-10-11 DIAGNOSIS — S32050D Wedge compression fracture of fifth lumbar vertebra, subsequent encounter for fracture with routine healing: Secondary | ICD-10-CM | POA: Diagnosis not present

## 2016-10-11 DIAGNOSIS — K6812 Psoas muscle abscess: Secondary | ICD-10-CM | POA: Diagnosis not present

## 2016-10-11 DIAGNOSIS — A4101 Sepsis due to Methicillin susceptible Staphylococcus aureus: Secondary | ICD-10-CM | POA: Diagnosis not present

## 2016-10-11 DIAGNOSIS — I10 Essential (primary) hypertension: Secondary | ICD-10-CM | POA: Diagnosis not present

## 2016-10-11 DIAGNOSIS — Z5181 Encounter for therapeutic drug level monitoring: Secondary | ICD-10-CM | POA: Diagnosis not present

## 2016-10-11 DIAGNOSIS — Z792 Long term (current) use of antibiotics: Secondary | ICD-10-CM | POA: Diagnosis not present

## 2016-10-11 DIAGNOSIS — T8451XA Infection and inflammatory reaction due to internal right hip prosthesis, initial encounter: Secondary | ICD-10-CM | POA: Diagnosis not present

## 2016-10-11 DIAGNOSIS — A419 Sepsis, unspecified organism: Secondary | ICD-10-CM | POA: Diagnosis not present

## 2016-10-11 DIAGNOSIS — Z85828 Personal history of other malignant neoplasm of skin: Secondary | ICD-10-CM | POA: Diagnosis not present

## 2016-10-11 DIAGNOSIS — M48061 Spinal stenosis, lumbar region without neurogenic claudication: Secondary | ICD-10-CM | POA: Diagnosis not present

## 2016-10-12 ENCOUNTER — Ambulatory Visit (INDEPENDENT_AMBULATORY_CARE_PROVIDER_SITE_OTHER): Payer: BLUE CROSS/BLUE SHIELD | Admitting: Internal Medicine

## 2016-10-12 ENCOUNTER — Telehealth: Payer: Self-pay | Admitting: *Deleted

## 2016-10-12 ENCOUNTER — Encounter: Payer: Self-pay | Admitting: Internal Medicine

## 2016-10-12 VITALS — BP 148/77 | HR 112 | Temp 97.7°F | Ht 73.0 in | Wt 162.0 lb

## 2016-10-12 DIAGNOSIS — T8459XD Infection and inflammatory reaction due to other internal joint prosthesis, subsequent encounter: Secondary | ICD-10-CM

## 2016-10-12 DIAGNOSIS — Z96649 Presence of unspecified artificial hip joint: Secondary | ICD-10-CM | POA: Diagnosis not present

## 2016-10-12 DIAGNOSIS — Z5181 Encounter for therapeutic drug level monitoring: Secondary | ICD-10-CM

## 2016-10-12 DIAGNOSIS — K6812 Psoas muscle abscess: Secondary | ICD-10-CM | POA: Diagnosis not present

## 2016-10-12 DIAGNOSIS — Z452 Encounter for adjustment and management of vascular access device: Secondary | ICD-10-CM | POA: Diagnosis not present

## 2016-10-12 MED ORDER — CEPHALEXIN 500 MG PO CAPS: 500.0000 mg | ORAL_CAPSULE | Freq: Four times a day (QID) | ORAL | 2 refills | Status: DC

## 2016-10-12 NOTE — Progress Notes (Signed)
   Subjective:    Patient ID: Jon Choi, male    DOB: 1950-05-24, 66 y.o.   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  MSSA s/p excisional and non excisional debrediment of right hip 7/31, psoas abscess aspiration 7/28.  Due to severity of illness at the time, he underwent 1 stage revision.  He feels well, is ambulatory and improving every day.  No fever, no chills.  Some constipation.  Less hip pain.     Review of Systems  Constitutional: Negative for appetite change, fatigue and fever.  Skin: Negative for rash.       Objective:   Physical Exam  Constitutional: He appears well-developed and well-nourished. No distress.  HENT:  Mouth/Throat: No oropharyngeal exudate.  Eyes: No scleral icterus.  Cardiovascular: Normal rate, regular rhythm and normal heart sounds.   Musculoskeletal: He exhibits no edema.  picc without erythema  Lymphadenopathy:    He has no cervical adenopathy.  Skin: No rash noted.   SH: living by himself, smoker        Assessment & Plan:

## 2016-10-12 NOTE — Telephone Encounter (Signed)
Verbal order per Dr. Linus Salmons given to [place] at [facility] to pull patient's picc line after last dose on 10/23/16.

## 2016-10-12 NOTE — Assessment & Plan Note (Signed)
S/p drainage and being treated with current antibiotics

## 2016-10-12 NOTE — Assessment & Plan Note (Signed)
No issues noted on exam.  Will be removed after last dose on 9/10 by home health

## 2016-10-12 NOTE — Assessment & Plan Note (Signed)
Improving based on clinical improvement, recent ESR just 38, though CRP up at 31.  I will have him continue with IV cefazolin and rifampin through 9/10 and transition him to oral Keflex for an extended period.  He will follow up in about 6 weeks and will recheck ESR, CRP.  I will consider rescannng with CT at the time if the CRP and or ESR are elevated

## 2016-10-12 NOTE — Assessment & Plan Note (Signed)
No concerns on labs with normal creat, wbc

## 2016-10-16 DIAGNOSIS — E785 Hyperlipidemia, unspecified: Secondary | ICD-10-CM | POA: Diagnosis not present

## 2016-10-16 DIAGNOSIS — Z85828 Personal history of other malignant neoplasm of skin: Secondary | ICD-10-CM | POA: Diagnosis not present

## 2016-10-16 DIAGNOSIS — I10 Essential (primary) hypertension: Secondary | ICD-10-CM | POA: Diagnosis not present

## 2016-10-16 DIAGNOSIS — M48061 Spinal stenosis, lumbar region without neurogenic claudication: Secondary | ICD-10-CM | POA: Diagnosis not present

## 2016-10-16 DIAGNOSIS — S32050D Wedge compression fracture of fifth lumbar vertebra, subsequent encounter for fracture with routine healing: Secondary | ICD-10-CM | POA: Diagnosis not present

## 2016-10-16 DIAGNOSIS — N179 Acute kidney failure, unspecified: Secondary | ICD-10-CM | POA: Diagnosis not present

## 2016-10-16 DIAGNOSIS — T8451XA Infection and inflammatory reaction due to internal right hip prosthesis, initial encounter: Secondary | ICD-10-CM | POA: Diagnosis not present

## 2016-10-16 DIAGNOSIS — Z5181 Encounter for therapeutic drug level monitoring: Secondary | ICD-10-CM | POA: Diagnosis not present

## 2016-10-16 DIAGNOSIS — K6812 Psoas muscle abscess: Secondary | ICD-10-CM | POA: Diagnosis not present

## 2016-10-16 DIAGNOSIS — Z792 Long term (current) use of antibiotics: Secondary | ICD-10-CM | POA: Diagnosis not present

## 2016-10-16 DIAGNOSIS — R1013 Epigastric pain: Secondary | ICD-10-CM | POA: Diagnosis not present

## 2016-10-16 DIAGNOSIS — A4101 Sepsis due to Methicillin susceptible Staphylococcus aureus: Secondary | ICD-10-CM | POA: Diagnosis not present

## 2016-10-16 DIAGNOSIS — Z452 Encounter for adjustment and management of vascular access device: Secondary | ICD-10-CM | POA: Diagnosis not present

## 2016-10-19 ENCOUNTER — Ambulatory Visit: Payer: BLUE CROSS/BLUE SHIELD | Admitting: Internal Medicine

## 2016-10-24 ENCOUNTER — Ambulatory Visit: Payer: BLUE CROSS/BLUE SHIELD | Admitting: Internal Medicine

## 2016-10-24 DIAGNOSIS — A4101 Sepsis due to Methicillin susceptible Staphylococcus aureus: Secondary | ICD-10-CM | POA: Diagnosis not present

## 2016-10-24 DIAGNOSIS — N179 Acute kidney failure, unspecified: Secondary | ICD-10-CM | POA: Diagnosis not present

## 2016-10-24 DIAGNOSIS — Z792 Long term (current) use of antibiotics: Secondary | ICD-10-CM | POA: Diagnosis not present

## 2016-10-24 DIAGNOSIS — Z5181 Encounter for therapeutic drug level monitoring: Secondary | ICD-10-CM | POA: Diagnosis not present

## 2016-10-24 DIAGNOSIS — S32050D Wedge compression fracture of fifth lumbar vertebra, subsequent encounter for fracture with routine healing: Secondary | ICD-10-CM | POA: Diagnosis not present

## 2016-10-24 DIAGNOSIS — M48061 Spinal stenosis, lumbar region without neurogenic claudication: Secondary | ICD-10-CM | POA: Diagnosis not present

## 2016-10-24 DIAGNOSIS — K6812 Psoas muscle abscess: Secondary | ICD-10-CM | POA: Diagnosis not present

## 2016-10-24 DIAGNOSIS — E785 Hyperlipidemia, unspecified: Secondary | ICD-10-CM | POA: Diagnosis not present

## 2016-10-24 DIAGNOSIS — Z452 Encounter for adjustment and management of vascular access device: Secondary | ICD-10-CM | POA: Diagnosis not present

## 2016-10-24 DIAGNOSIS — T8451XA Infection and inflammatory reaction due to internal right hip prosthesis, initial encounter: Secondary | ICD-10-CM | POA: Diagnosis not present

## 2016-10-24 DIAGNOSIS — Z85828 Personal history of other malignant neoplasm of skin: Secondary | ICD-10-CM | POA: Diagnosis not present

## 2016-10-24 DIAGNOSIS — R6889 Other general symptoms and signs: Secondary | ICD-10-CM | POA: Diagnosis not present

## 2016-10-24 DIAGNOSIS — I10 Essential (primary) hypertension: Secondary | ICD-10-CM | POA: Diagnosis not present

## 2016-10-25 DIAGNOSIS — A419 Sepsis, unspecified organism: Secondary | ICD-10-CM | POA: Diagnosis not present

## 2016-10-25 DIAGNOSIS — T8459XA Infection and inflammatory reaction due to other internal joint prosthesis, initial encounter: Secondary | ICD-10-CM | POA: Diagnosis not present

## 2016-10-26 ENCOUNTER — Ambulatory Visit: Payer: BLUE CROSS/BLUE SHIELD | Admitting: Internal Medicine

## 2016-10-27 ENCOUNTER — Other Ambulatory Visit: Payer: Self-pay | Admitting: Pharmacist

## 2016-10-27 DIAGNOSIS — Z792 Long term (current) use of antibiotics: Secondary | ICD-10-CM | POA: Diagnosis not present

## 2016-10-27 DIAGNOSIS — Z452 Encounter for adjustment and management of vascular access device: Secondary | ICD-10-CM | POA: Diagnosis not present

## 2016-10-31 DIAGNOSIS — K6812 Psoas muscle abscess: Secondary | ICD-10-CM | POA: Diagnosis not present

## 2016-10-31 DIAGNOSIS — T8451XA Infection and inflammatory reaction due to internal right hip prosthesis, initial encounter: Secondary | ICD-10-CM | POA: Diagnosis not present

## 2016-10-31 DIAGNOSIS — N179 Acute kidney failure, unspecified: Secondary | ICD-10-CM | POA: Diagnosis not present

## 2016-10-31 DIAGNOSIS — E785 Hyperlipidemia, unspecified: Secondary | ICD-10-CM | POA: Diagnosis not present

## 2016-10-31 DIAGNOSIS — Z792 Long term (current) use of antibiotics: Secondary | ICD-10-CM | POA: Diagnosis not present

## 2016-10-31 DIAGNOSIS — M48061 Spinal stenosis, lumbar region without neurogenic claudication: Secondary | ICD-10-CM | POA: Diagnosis not present

## 2016-10-31 DIAGNOSIS — Z452 Encounter for adjustment and management of vascular access device: Secondary | ICD-10-CM | POA: Diagnosis not present

## 2016-10-31 DIAGNOSIS — Z5181 Encounter for therapeutic drug level monitoring: Secondary | ICD-10-CM | POA: Diagnosis not present

## 2016-10-31 DIAGNOSIS — Z85828 Personal history of other malignant neoplasm of skin: Secondary | ICD-10-CM | POA: Diagnosis not present

## 2016-10-31 DIAGNOSIS — A4101 Sepsis due to Methicillin susceptible Staphylococcus aureus: Secondary | ICD-10-CM | POA: Diagnosis not present

## 2016-10-31 DIAGNOSIS — S32050D Wedge compression fracture of fifth lumbar vertebra, subsequent encounter for fracture with routine healing: Secondary | ICD-10-CM | POA: Diagnosis not present

## 2016-10-31 DIAGNOSIS — I10 Essential (primary) hypertension: Secondary | ICD-10-CM | POA: Diagnosis not present

## 2016-11-23 ENCOUNTER — Ambulatory Visit: Payer: BLUE CROSS/BLUE SHIELD | Admitting: Internal Medicine

## 2016-11-28 DIAGNOSIS — I10 Essential (primary) hypertension: Secondary | ICD-10-CM | POA: Diagnosis not present

## 2016-11-28 DIAGNOSIS — E782 Mixed hyperlipidemia: Secondary | ICD-10-CM | POA: Diagnosis not present

## 2016-11-28 DIAGNOSIS — Z23 Encounter for immunization: Secondary | ICD-10-CM | POA: Diagnosis not present

## 2016-11-28 DIAGNOSIS — Z125 Encounter for screening for malignant neoplasm of prostate: Secondary | ICD-10-CM | POA: Diagnosis not present

## 2016-11-28 DIAGNOSIS — Z0000 Encounter for general adult medical examination without abnormal findings: Secondary | ICD-10-CM | POA: Diagnosis not present

## 2016-11-29 ENCOUNTER — Ambulatory Visit: Payer: BLUE CROSS/BLUE SHIELD | Admitting: Internal Medicine

## 2016-11-30 ENCOUNTER — Encounter: Payer: Self-pay | Admitting: Internal Medicine

## 2016-11-30 ENCOUNTER — Ambulatory Visit (INDEPENDENT_AMBULATORY_CARE_PROVIDER_SITE_OTHER): Payer: BLUE CROSS/BLUE SHIELD | Admitting: Internal Medicine

## 2016-11-30 VITALS — BP 137/87 | HR 77 | Temp 97.6°F | Wt 170.0 lb

## 2016-11-30 DIAGNOSIS — Z5181 Encounter for therapeutic drug level monitoring: Secondary | ICD-10-CM

## 2016-11-30 DIAGNOSIS — K6812 Psoas muscle abscess: Secondary | ICD-10-CM

## 2016-11-30 DIAGNOSIS — T8459XD Infection and inflammatory reaction due to other internal joint prosthesis, subsequent encounter: Secondary | ICD-10-CM

## 2016-11-30 DIAGNOSIS — Z452 Encounter for adjustment and management of vascular access device: Secondary | ICD-10-CM | POA: Diagnosis not present

## 2016-11-30 DIAGNOSIS — Z96649 Presence of unspecified artificial hip joint: Secondary | ICD-10-CM | POA: Diagnosis not present

## 2016-11-30 NOTE — Assessment & Plan Note (Signed)
1 stage and doing well.  He will continue with keflex for 1 more refill and stop if his inflammatory markers remain good.

## 2016-11-30 NOTE — Assessment & Plan Note (Signed)
I will check a CmP today

## 2016-11-30 NOTE — Assessment & Plan Note (Signed)
Seems to have resolved clinically.  On antibiotics for 1 more refill.

## 2016-11-30 NOTE — Assessment & Plan Note (Signed)
No removed

## 2016-11-30 NOTE — Progress Notes (Signed)
   Subjective:    Patient ID: Jon Choi, male    DOB: [birth year], 66 y.o.   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  MSSA s/p excisional and non excisional debrediment of right hip 7/31, psoas abscess aspiration 7/28.  Due to severity of illness at the time, he underwent 1 stage revision.  He has been feeling well and his last inflammatory markers with CRP 12.3 and ESR 52.  He has noted significantly more pain since stopping the opioid medications and is taking Aleve.  His main pain is his back and he tells me he is likely going to get a surgery once the infection resolves.  No diarrhea.  He completed his 6 weeks of IV cefazolin and is on his second month of oral Keflex.  No associated n/v.  Picc was removed.    Review of Systems  Constitutional: Negative for appetite change, fatigue and fever.  Skin: Negative for rash.       Objective:   Physical Exam  Constitutional: He appears well-developed and well-nourished. No distress.  HENT:  Mouth/Throat: No oropharyngeal exudate.  Eyes: No scleral icterus.  Cardiovascular: Normal rate, regular rhythm and normal heart sounds.   Musculoskeletal:  Back without warmth/erythema in the psoas area  Lymphadenopathy:    He has no cervical adenopathy.  Skin: No rash noted.   SH: living by himself, smoker        Assessment & Plan:

## 2016-12-01 LAB — COMPREHENSIVE METABOLIC PANEL
AG RATIO: 1.2 (calc) (ref 1.0–2.5)
ALBUMIN MSPROF: 4.3 g/dL (ref 3.6–5.1)
ALT: 8 U/L — ABNORMAL LOW (ref 9–46)
AST: 16 U/L (ref 10–35)
Alkaline phosphatase (APISO): 69 U/L (ref 40–115)
BILIRUBIN TOTAL: 0.4 mg/dL (ref 0.2–1.2)
BUN: 11 mg/dL (ref 7–25)
CHLORIDE: 101 mmol/L (ref 98–110)
CO2: 27 mmol/L (ref 20–32)
Calcium: 9.5 mg/dL (ref 8.6–10.3)
Creat: 0.88 mg/dL (ref 0.70–1.25)
GLOBULIN: 3.6 g/dL (ref 1.9–3.7)
GLUCOSE: 87 mg/dL (ref 65–99)
POTASSIUM: 4.6 mmol/L (ref 3.5–5.3)
SODIUM: 137 mmol/L (ref 135–146)
TOTAL PROTEIN: 7.9 g/dL (ref 6.1–8.1)

## 2016-12-01 LAB — C-REACTIVE PROTEIN: CRP: 2 mg/L (ref <8.0)

## 2016-12-01 LAB — SEDIMENTATION RATE: SED RATE: 29 mm/h — AB (ref 0–20)

## 2016-12-04 ENCOUNTER — Other Ambulatory Visit: Payer: Self-pay | Admitting: Orthopedic Surgery

## 2016-12-04 DIAGNOSIS — Z96641 Presence of right artificial hip joint: Secondary | ICD-10-CM

## 2016-12-04 DIAGNOSIS — Z471 Aftercare following joint replacement surgery: Secondary | ICD-10-CM

## 2016-12-05 ENCOUNTER — Other Ambulatory Visit: Payer: Self-pay | Admitting: Orthopedic Surgery

## 2016-12-05 DIAGNOSIS — K651 Peritoneal abscess: Secondary | ICD-10-CM

## 2016-12-06 ENCOUNTER — Ambulatory Visit
Admission: RE | Admit: 2016-12-06 | Discharge: 2016-12-06 | Disposition: A | Discharge status: Home / Self Care | Payer: BLUE CROSS/BLUE SHIELD | Source: Ambulatory Visit | Attending: Orthopedic Surgery | Admitting: Orthopedic Surgery

## 2016-12-06 DIAGNOSIS — K651 Peritoneal abscess: Secondary | ICD-10-CM

## 2016-12-06 DIAGNOSIS — R590 Localized enlarged lymph nodes: Secondary | ICD-10-CM | POA: Diagnosis not present

## 2016-12-06 MED ORDER — IOPAMIDOL (ISOVUE-300) INJECTION 61%
100.0000 mL | Freq: Once | INTRAVENOUS | Status: AC | PRN
  Administered 2016-12-06: 100 mL via INTRAVENOUS

## 2016-12-19 DIAGNOSIS — D1801 Hemangioma of skin and subcutaneous tissue: Secondary | ICD-10-CM | POA: Diagnosis not present

## 2016-12-19 DIAGNOSIS — C44519 Basal cell carcinoma of skin of other part of trunk: Secondary | ICD-10-CM | POA: Diagnosis not present

## 2016-12-19 DIAGNOSIS — Z85828 Personal history of other malignant neoplasm of skin: Secondary | ICD-10-CM | POA: Diagnosis not present

## 2016-12-19 DIAGNOSIS — M4316 Spondylolisthesis, lumbar region: Secondary | ICD-10-CM | POA: Diagnosis not present

## 2016-12-19 DIAGNOSIS — C44612 Basal cell carcinoma of skin of right upper limb, including shoulder: Secondary | ICD-10-CM | POA: Diagnosis not present

## 2016-12-19 DIAGNOSIS — L570 Actinic keratosis: Secondary | ICD-10-CM | POA: Diagnosis not present

## 2016-12-22 ENCOUNTER — Other Ambulatory Visit: Payer: Self-pay | Admitting: Neurosurgery

## 2017-01-08 ENCOUNTER — Inpatient Hospital Stay (HOSPITAL_COMMUNITY): Admission: RE | Admit: 2017-01-08 | Payer: BLUE CROSS/BLUE SHIELD | Source: Ambulatory Visit

## 2017-02-08 DIAGNOSIS — C44612 Basal cell carcinoma of skin of right upper limb, including shoulder: Secondary | ICD-10-CM | POA: Diagnosis not present

## 2017-02-08 DIAGNOSIS — C44519 Basal cell carcinoma of skin of other part of trunk: Secondary | ICD-10-CM | POA: Diagnosis not present

## 2017-02-14 NOTE — Pre-Procedure Instructions (Signed)
[facility]  02/14/2017      [facility] - [place], [place] - [address] [place] [address] Phone: [phone] Fax: [phone]    Your procedure is scheduled on Friday January 11.  Report to [facility] Admitting at 5:30 A.M.  Call this number if you have problems the morning of surgery:  [phone]   Remember:  Do not eat food or drink liquids after midnight.  Take these medicines the morning of surgery with A SIP OF WATER: Oxycodone if needed  7 days prior to surgery STOP taking any Aspirin(unless otherwise instructed by your surgeon), celebrex (celecoxib) Aleve, Naproxen, Ibuprofen, Motrin, Advil, Goody's, BC's, all herbal medications, fish oil, and all vitamins    Do not wear jewelry, make-up or nail polish.  Do not wear lotions, powders, or perfumes, or deodorant.  Do not shave 48 hours prior to surgery.  Men may shave face and neck.  Do not bring valuables to the hospital.  [facility] is not responsible for any belongings or valuables.  Contacts, dentures or bridgework may not be worn into surgery.  Leave your suitcase in the car.  After surgery it may be brought to your room.  For patients admitted to the hospital, discharge time will be determined by your treatment team.  Patients discharged the day of surgery will not be allowed to drive home.   Special instructions:    [facility]- Preparing For Surgery  Before surgery, you can play an important role. Because skin is not sterile, your skin needs to be as free of germs as possible. You can reduce the number of germs on your skin by washing with CHG (chlorahexidine gluconate) Soap before surgery.  CHG is an antiseptic cleaner which kills germs and bonds with the skin to continue killing germs even after washing.  Please do not use if you have an allergy to CHG or antibacterial soaps. If your skin becomes reddened/irritated stop using the CHG.  Do not  shave (including legs and underarms) for at least 48 hours prior to first CHG shower. It is OK to shave your face.  Please follow these instructions carefully.   1. Shower the NIGHT BEFORE SURGERY and the MORNING OF SURGERY with CHG.   2. If you chose to wash your hair, wash your hair first as usual with your normal shampoo.  3. After you shampoo, rinse your hair and body thoroughly to remove the shampoo.  4. Use CHG as you would any other liquid soap. You can apply CHG directly to the skin and wash gently with a scrungie or a clean washcloth.   5. Apply the CHG Soap to your body ONLY FROM THE NECK DOWN.  Do not use on open wounds or open sores. Avoid contact with your eyes, ears, mouth and genitals (private parts). Wash Face and genitals (private parts)  with your normal soap.  6. Wash thoroughly, paying special attention to the area where your surgery will be performed.  7. Thoroughly rinse your body with warm water from the neck down.  8. DO NOT shower/wash with your normal soap after using and rinsing off the CHG Soap.  9. Pat yourself dry with a CLEAN TOWEL.  10. Wear CLEAN PAJAMAS to bed the night before surgery, wear comfortable clothes the morning of surgery  11. Place CLEAN SHEETS on your bed the night of your first shower and DO NOT SLEEP WITH PETS.  Day of Surgery: Do not apply any deodorants/lotions. Please wear clean clothes to the hospital/surgery center.      Please read over the following fact sheets that you were given. Coughing and Deep Breathing, MRSA Information and Surgical Site Infection Prevention

## 2017-02-15 ENCOUNTER — Encounter (HOSPITAL_COMMUNITY): Payer: Self-pay

## 2017-02-15 ENCOUNTER — Encounter (HOSPITAL_COMMUNITY)
Admission: RE | Admit: 2017-02-15 | Discharge: 2017-02-15 | Disposition: A | Discharge status: Home / Self Care | Payer: BLUE CROSS/BLUE SHIELD | Source: Ambulatory Visit | Attending: Neurosurgery | Admitting: Neurosurgery

## 2017-02-15 ENCOUNTER — Other Ambulatory Visit: Payer: Self-pay

## 2017-02-15 DIAGNOSIS — Z01812 Encounter for preprocedural laboratory examination: Secondary | ICD-10-CM | POA: Diagnosis not present

## 2017-02-15 HISTORY — DX: Unspecified osteoarthritis, unspecified site: M19.90

## 2017-02-15 HISTORY — DX: Personal history of urinary calculi: Z87.442

## 2017-02-15 HISTORY — DX: Essential (primary) hypertension: I10

## 2017-02-15 LAB — BASIC METABOLIC PANEL
ANION GAP: 9 (ref 5–15)
BUN: 12 mg/dL (ref 6–20)
CO2: 23 mmol/L (ref 22–32)
Calcium: 9.1 mg/dL (ref 8.9–10.3)
Chloride: 106 mmol/L (ref 101–111)
Creatinine, Ser: 0.87 mg/dL (ref 0.61–1.24)
GFR calc non Af Amer: >60 mL/min (ref >60)
Glucose, Bld: 104 mg/dL — ABNORMAL HIGH (ref 65–99)
Potassium: 3.8 mmol/L (ref 3.5–5.1)
Sodium: 138 mmol/L (ref 135–145)

## 2017-02-15 LAB — CBC
HEMATOCRIT: 42.3 % (ref 39.0–52.0)
HEMOGLOBIN: 13.6 g/dL (ref 13.0–17.0)
MCH: 29.8 pg (ref 26.0–34.0)
MCHC: 32.2 g/dL (ref 30.0–36.0)
MCV: 92.8 fL (ref 78.0–100.0)
Platelets: 275 10*3/uL (ref 150–400)
RBC: 4.56 MIL/uL (ref 4.22–5.81)
RDW: 13.8 % (ref 11.5–15.5)
WBC: 7.5 10*3/uL (ref 4.0–10.5)

## 2017-02-15 LAB — TYPE AND SCREEN
ABO/RH(D): A POS
ANTIBODY SCREEN: NEGATIVE

## 2017-02-15 LAB — ABO/RH: ABO/RH(D): A POS

## 2017-02-15 LAB — SURGICAL PCR SCREEN
MRSA, PCR: NEGATIVE
STAPHYLOCOCCUS AUREUS: NEGATIVE

## 2017-02-15 NOTE — Pre-Procedure Instructions (Signed)
Jon [facility]oi  02/15/2017      [facility] [place] [address] Phone: [phone] Fax: [phone]    Your procedure is scheduled on Friday January 11.  Report to [facility] at 5:30 A.M.  Call this number if you have problems the morning of surgery:  [phone]   Remember:  Do not eat food or drink liquids after midnight.  Take these medicines the morning of surgery with A SIP OF WATER: Oxycodone if needed, METOPROLOL (TOPROLXL),   7 days prior to surgery STOP taking any Aspirin(unless otherwise instructed by your surgeon), celebrex (celecoxib) Aleve, Naproxen, Ibuprofen, Motrin, Advil, Goody's, BC's, all herbal medications, fish oil, and all vitamins    Do not wear jewelry, make-up or nail polish.  Do not wear lotions, powders, or perfumes, or deodorant.  Do not shave 48 hours prior to surgery.  Men may shave face and neck.  Do not bring valuables to the hospital.  [facility] is not responsible for any belongings or valuables.  Contacts, dentures or bridgework may not be worn into surgery.  Leave your suitcase in the car.  After surgery it may be brought to your room.  For patients admitted to the hospital, discharge time will be determined by your treatment team.  Patients discharged the day of surgery will not be allowed to drive home.   Special instructions:    [facility]- Preparing For Surgery  Before surgery, you can play an important role. Because skin is not sterile, your skin needs to be as free of germs as possible. You can reduce the number of germs on your skin by washing with CHG (chlorahexidine gluconate) Soap before surgery.  CHG is an antiseptic cleaner which kills germs and bonds with the skin to continue killing germs even after washing.  Please do not use if you have an allergy to CHG or antibacterial soaps. If your skin becomes reddened/irritated stop  using the CHG.  Do not shave (including legs and underarms) for at least 48 hours prior to first CHG shower. It is OK to shave your face.  Please follow these instructions carefully.   1. Shower the NIGHT BEFORE SURGERY and the MORNING OF SURGERY with CHG.   2. If you chose to wash your hair, wash your hair first as usual with your normal shampoo.  3. After you shampoo, rinse your hair and body thoroughly to remove the shampoo.  4. Use CHG as you would any other liquid soap. You can apply CHG directly to the skin and wash gently with a scrungie or a clean washcloth.   5. Apply the CHG Soap to your body ONLY FROM THE NECK DOWN.  Do not use on open wounds or open sores. Avoid contact with your eyes, ears, mouth and genitals (private parts). Wash Face and genitals (private parts)  with your normal soap.  6. Wash thoroughly, paying special attention to the area where your surgery will be performed.  7. Thoroughly rinse your body with warm water from the neck down.  8. DO NOT shower/wash with your normal soap after using and rinsing off the CHG Soap.  9. Pat yourself dry with a CLEAN TOWEL.  10. Wear CLEAN PAJAMAS to bed the night before surgery, wear comfortable clothes the morning of surgery  11. Place CLEAN SHEETS on your bed the night of your first shower and DO NOT SLEEP WITH  PETS.    Day of Surgery: Do not apply any deodorants/lotions. Please wear clean clothes to the hospital/surgery [place].      Please read over the following fact sheets that you were given. Coughing and Deep Breathing, MRSA Information and Surgical Site Infection Prevention

## 2017-02-22 NOTE — Anesthesia Preprocedure Evaluation (Signed)
Anesthesia Evaluation  Patient identified by MRN, date of birth, ID band Patient awake    Reviewed: Allergy & Precautions, NPO status , Patient's Chart, lab work & pertinent test results, reviewed documented beta blocker date and time   History of Anesthesia Complications Negative for: history of anesthetic complications  Airway Mallampati: II  TM Distance: >3 FB Neck ROM: Full    Dental  (+) Dental Advisory Given, Caps   Pulmonary Current Smoker, former smoker,    breath sounds clear to auscultation       Cardiovascular hypertension, Pt. on medications and Pt. on home beta blockers  Rhythm:Regular Rate:Normal     Neuro/Psych Spinal stenosis    GI/Hepatic negative GI ROS, Neg liver ROS,   Endo/Other  negative endocrine ROS  Renal/GU negative Renal ROS     Musculoskeletal   Abdominal   Peds  Hematology negative hematology ROS (+)   Anesthesia Other Findings   Reproductive/Obstetrics                             Anesthesia Physical  Anesthesia Plan  ASA: II  Anesthesia Plan: General   Post-op Pain Management:    Induction: Intravenous  PONV Risk Score and Plan: 3 and Ondansetron, Dexamethasone and Midazolam  Airway Management Planned: Oral ETT  Additional Equipment:   Intra-op Plan:   Post-operative Plan: Extubation in OR  Informed Consent: I have reviewed the patients History and Physical, chart, labs and discussed the procedure including the risks, benefits and alternatives for the proposed anesthesia with the patient or authorized representative who has indicated his/her understanding and acceptance.   Dental advisory given  Plan Discussed with: CRNA  Anesthesia Plan Comments:         Anesthesia Quick Evaluation

## 2017-02-23 ENCOUNTER — Inpatient Hospital Stay (HOSPITAL_COMMUNITY)
Admission: RE | Admit: 2017-02-23 | Discharge: 2017-02-23 | DRG: 455 | Disposition: A | Discharge status: Home / Self Care | Payer: BLUE CROSS/BLUE SHIELD | Source: Ambulatory Visit | Attending: Neurosurgery | Admitting: Neurosurgery

## 2017-02-23 ENCOUNTER — Encounter (HOSPITAL_COMMUNITY)
Admission: RE | Disposition: A | Discharge status: Home / Self Care | Payer: Self-pay | Source: Ambulatory Visit | Attending: Neurosurgery

## 2017-02-23 ENCOUNTER — Inpatient Hospital Stay (HOSPITAL_COMMUNITY): Payer: BLUE CROSS/BLUE SHIELD

## 2017-02-23 ENCOUNTER — Other Ambulatory Visit: Payer: Self-pay

## 2017-02-23 ENCOUNTER — Encounter (HOSPITAL_COMMUNITY): Payer: Self-pay | Admitting: *Deleted

## 2017-02-23 ENCOUNTER — Inpatient Hospital Stay (HOSPITAL_COMMUNITY): Payer: BLUE CROSS/BLUE SHIELD | Admitting: Certified Registered"

## 2017-02-23 DIAGNOSIS — M48061 Spinal stenosis, lumbar region without neurogenic claudication: Secondary | ICD-10-CM | POA: Diagnosis not present

## 2017-02-23 DIAGNOSIS — Z87891 Personal history of nicotine dependence: Secondary | ICD-10-CM

## 2017-02-23 DIAGNOSIS — Z96641 Presence of right artificial hip joint: Secondary | ICD-10-CM | POA: Diagnosis not present

## 2017-02-23 DIAGNOSIS — Z419 Encounter for procedure for purposes other than remedying health state, unspecified: Secondary | ICD-10-CM

## 2017-02-23 DIAGNOSIS — A4901 Methicillin susceptible Staphylococcus aureus infection, unspecified site: Secondary | ICD-10-CM | POA: Diagnosis not present

## 2017-02-23 DIAGNOSIS — E785 Hyperlipidemia, unspecified: Secondary | ICD-10-CM | POA: Diagnosis not present

## 2017-02-23 DIAGNOSIS — R402253 Coma scale, best verbal response, oriented, at hospital admission: Secondary | ICD-10-CM | POA: Diagnosis not present

## 2017-02-23 DIAGNOSIS — M4316 Spondylolisthesis, lumbar region: Secondary | ICD-10-CM | POA: Diagnosis present

## 2017-02-23 DIAGNOSIS — Z79899 Other long term (current) drug therapy: Secondary | ICD-10-CM

## 2017-02-23 DIAGNOSIS — I10 Essential (primary) hypertension: Secondary | ICD-10-CM | POA: Diagnosis present

## 2017-02-23 DIAGNOSIS — M5126 Other intervertebral disc displacement, lumbar region: Secondary | ICD-10-CM | POA: Diagnosis not present

## 2017-02-23 DIAGNOSIS — R402143 Coma scale, eyes open, spontaneous, at hospital admission: Secondary | ICD-10-CM | POA: Diagnosis not present

## 2017-02-23 DIAGNOSIS — R402363 Coma scale, best motor response, obeys commands, at hospital admission: Secondary | ICD-10-CM | POA: Diagnosis not present

## 2017-02-23 DIAGNOSIS — M5416 Radiculopathy, lumbar region: Secondary | ICD-10-CM | POA: Diagnosis not present

## 2017-02-23 DIAGNOSIS — M4326 Fusion of spine, lumbar region: Secondary | ICD-10-CM | POA: Diagnosis not present

## 2017-02-23 SURGERY — POSTERIOR LUMBAR FUSION 1 LEVEL
Anesthesia: General | Site: Back

## 2017-02-23 MED ORDER — 0.9 % SODIUM CHLORIDE (POUR BTL) OPTIME
TOPICAL | Status: DC | PRN
  Administered 2017-02-23: 1000 mL

## 2017-02-23 MED ORDER — BUPIVACAINE HCL (PF) 0.25 % IJ SOLN
INTRAMUSCULAR | Status: DC | PRN
  Administered 2017-02-23: 8 mL

## 2017-02-23 MED ORDER — FENTANYL CITRATE (PF) 250 MCG/5ML IJ SOLN
INTRAMUSCULAR | Status: DC | PRN
  Administered 2017-02-23: 150 ug via INTRAVENOUS
  Administered 2017-02-23: 100 ug via INTRAVENOUS

## 2017-02-23 MED ORDER — OXYCODONE HCL 5 MG PO TABS
ORAL_TABLET | ORAL | Status: AC
  Filled 2017-02-23: qty 2

## 2017-02-23 MED ORDER — DEXAMETHASONE SODIUM PHOSPHATE 10 MG/ML IJ SOLN
10.0000 mg | INTRAMUSCULAR | Status: AC
  Administered 2017-02-23: 10 mg via INTRAVENOUS

## 2017-02-23 MED ORDER — ONDANSETRON HCL 4 MG/2ML IJ SOLN: 4.0000 mg | Freq: Four times a day (QID) | INTRAMUSCULAR | Status: DC | PRN

## 2017-02-23 MED ORDER — CHLORHEXIDINE GLUCONATE CLOTH 2 % EX PADS: 6.0000 | MEDICATED_PAD | Freq: Once | CUTANEOUS | Status: DC

## 2017-02-23 MED ORDER — MIDAZOLAM HCL 2 MG/2ML IJ SOLN
INTRAMUSCULAR | Status: AC
  Filled 2017-02-23: qty 2

## 2017-02-23 MED ORDER — DEXAMETHASONE SODIUM PHOSPHATE 10 MG/ML IJ SOLN
INTRAMUSCULAR | Status: AC
  Filled 2017-02-23: qty 1

## 2017-02-23 MED ORDER — LACTATED RINGERS IV SOLN
INTRAVENOUS | Status: DC | PRN
  Administered 2017-02-23 (×2): via INTRAVENOUS

## 2017-02-23 MED ORDER — HYDROMORPHONE HCL 1 MG/ML IJ SOLN
INTRAMUSCULAR | Status: AC
  Filled 2017-02-23: qty 1

## 2017-02-23 MED ORDER — PROPOFOL 10 MG/ML IV BOLUS
INTRAVENOUS | Status: AC
  Filled 2017-02-23: qty 20

## 2017-02-23 MED ORDER — LIDOCAINE HCL (CARDIAC) 20 MG/ML IV SOLN
INTRAVENOUS | Status: DC | PRN
  Administered 2017-02-23: 60 mg via INTRATRACHEAL

## 2017-02-23 MED ORDER — ACETAMINOPHEN 650 MG RE SUPP: 650.0000 mg | RECTAL | Status: DC | PRN

## 2017-02-23 MED ORDER — SURGIFOAM 100 EX MISC
CUTANEOUS | Status: DC | PRN
  Administered 2017-02-23: 08:00:00 via TOPICAL

## 2017-02-23 MED ORDER — FERROUS SULFATE 325 (65 FE) MG PO TABS
325.0000 mg | ORAL_TABLET | Freq: Three times a day (TID) | ORAL | Status: DC
  Administered 2017-02-23: 325 mg via ORAL
  Filled 2017-02-23: qty 1

## 2017-02-23 MED ORDER — ROCURONIUM BROMIDE 100 MG/10ML IV SOLN
INTRAVENOUS | Status: DC | PRN
  Administered 2017-02-23: 30 mg via INTRAVENOUS
  Administered 2017-02-23: 20 mg via INTRAVENOUS
  Administered 2017-02-23: 50 mg via INTRAVENOUS

## 2017-02-23 MED ORDER — MUPIROCIN 2 % EX OINT
1.0000 "application " | TOPICAL_OINTMENT | CUTANEOUS | Status: DC
  Filled 2017-02-23: qty 22

## 2017-02-23 MED ORDER — ACETAMINOPHEN 325 MG PO TABS
650.0000 mg | ORAL_TABLET | ORAL | Status: DC | PRN
Start: 2017-02-23 — End: 2017-02-23

## 2017-02-23 MED ORDER — PANTOPRAZOLE SODIUM 40 MG IV SOLR: 40.0000 mg | Freq: Every day | INTRAVENOUS | Status: DC

## 2017-02-23 MED ORDER — BUPIVACAINE LIPOSOME 1.3 % IJ SUSP
INTRAMUSCULAR | Status: DC | PRN
  Administered 2017-02-23: 20 mL

## 2017-02-23 MED ORDER — ALUM & MAG HYDROXIDE-SIMETH 200-200-20 MG/5ML PO SUSP: 30.0000 mL | Freq: Four times a day (QID) | ORAL | Status: DC | PRN

## 2017-02-23 MED ORDER — BUPIVACAINE HCL (PF) 0.25 % IJ SOLN
INTRAMUSCULAR | Status: AC
  Filled 2017-02-23: qty 30

## 2017-02-23 MED ORDER — CYCLOBENZAPRINE HCL 10 MG PO TABS
ORAL_TABLET | ORAL | Status: AC
  Filled 2017-02-23: qty 1

## 2017-02-23 MED ORDER — PROPOFOL 10 MG/ML IV BOLUS
INTRAVENOUS | Status: DC | PRN
  Administered 2017-02-23: 160 mg via INTRAVENOUS

## 2017-02-23 MED ORDER — BUPIVACAINE LIPOSOME 1.3 % IJ SUSP
20.0000 mL | INTRAMUSCULAR | Status: DC
  Filled 2017-02-23: qty 20

## 2017-02-23 MED ORDER — CEFAZOLIN SODIUM-DEXTROSE 2-4 GM/100ML-% IV SOLN
2.0000 g | INTRAVENOUS | Status: AC
  Administered 2017-02-23: 2 g via INTRAVENOUS

## 2017-02-23 MED ORDER — METOPROLOL SUCCINATE ER 50 MG PO TB24
50.0000 mg | ORAL_TABLET | Freq: Every day | ORAL | Status: DC
  Administered 2017-02-23: 50 mg via ORAL
  Filled 2017-02-23: qty 1

## 2017-02-23 MED ORDER — METOCLOPRAMIDE HCL 5 MG PO TABS: 5.0000 mg | ORAL_TABLET | Freq: Three times a day (TID) | ORAL | Status: DC | PRN

## 2017-02-23 MED ORDER — TEMAZEPAM 15 MG PO CAPS: 30.0000 mg | ORAL_CAPSULE | Freq: Every day | ORAL | Status: DC

## 2017-02-23 MED ORDER — ROCURONIUM BROMIDE 10 MG/ML (PF) SYRINGE
PREFILLED_SYRINGE | INTRAVENOUS | Status: AC
  Filled 2017-02-23: qty 10

## 2017-02-23 MED ORDER — MIDAZOLAM HCL 5 MG/5ML IJ SOLN
INTRAMUSCULAR | Status: DC | PRN
  Administered 2017-02-23: 2 mg via INTRAVENOUS

## 2017-02-23 MED ORDER — THROMBIN (RECOMBINANT) 5000 UNITS EX SOLR
CUTANEOUS | Status: AC
  Filled 2017-02-23: qty 5000

## 2017-02-23 MED ORDER — LIDOCAINE 2% (20 MG/ML) 5 ML SYRINGE
INTRAMUSCULAR | Status: AC
  Filled 2017-02-23: qty 10

## 2017-02-23 MED ORDER — FENTANYL CITRATE (PF) 250 MCG/5ML IJ SOLN
INTRAMUSCULAR | Status: AC
  Filled 2017-02-23: qty 5

## 2017-02-23 MED ORDER — CELECOXIB 200 MG PO CAPS
200.0000 mg | ORAL_CAPSULE | Freq: Every day | ORAL | Status: DC
  Administered 2017-02-23: 200 mg via ORAL
  Filled 2017-02-23: qty 1

## 2017-02-23 MED ORDER — THROMBIN (RECOMBINANT) 20000 UNITS EX SOLR
CUTANEOUS | Status: AC
  Filled 2017-02-23: qty 20000

## 2017-02-23 MED ORDER — PANTOPRAZOLE SODIUM 40 MG PO TBEC
40.0000 mg | DELAYED_RELEASE_TABLET | Freq: Every day | ORAL | Status: DC
  Administered 2017-02-23: 40 mg via ORAL
  Filled 2017-02-23: qty 1

## 2017-02-23 MED ORDER — KETOROLAC TROMETHAMINE 30 MG/ML IJ SOLN
INTRAMUSCULAR | Status: DC | PRN
  Administered 2017-02-23: 30 mg via INTRAVENOUS

## 2017-02-23 MED ORDER — OXYCODONE HCL 5 MG PO TABS
5.0000 mg | ORAL_TABLET | ORAL | Status: DC | PRN
  Administered 2017-02-23 (×2): 10 mg via ORAL
  Filled 2017-02-23 (×2): qty 2

## 2017-02-23 MED ORDER — OXYCODONE HCL 5 MG PO TABS: 5.0000 mg | ORAL_TABLET | ORAL | 0 refills | Status: DC | PRN

## 2017-02-23 MED ORDER — HYDROMORPHONE HCL 1 MG/ML IJ SOLN: 0.5000 mg | INTRAMUSCULAR | Status: DC | PRN

## 2017-02-23 MED ORDER — LIDOCAINE-EPINEPHRINE 1 %-1:100000 IJ SOLN
INTRAMUSCULAR | Status: AC
  Filled 2017-02-23: qty 1

## 2017-02-23 MED ORDER — CEFAZOLIN SODIUM-DEXTROSE 2-4 GM/100ML-% IV SOLN
INTRAVENOUS | Status: AC
  Filled 2017-02-23: qty 100

## 2017-02-23 MED ORDER — VANCOMYCIN HCL 1000 MG IV SOLR
INTRAVENOUS | Status: DC | PRN
  Administered 2017-02-23: 1000 mg

## 2017-02-23 MED ORDER — CEFAZOLIN SODIUM-DEXTROSE 2-4 GM/100ML-% IV SOLN
2.0000 g | Freq: Three times a day (TID) | INTRAVENOUS | Status: DC
  Administered 2017-02-23: 2 g via INTRAVENOUS
  Filled 2017-02-23: qty 100

## 2017-02-23 MED ORDER — ALBUMIN HUMAN 5 % IV SOLN
INTRAVENOUS | Status: DC | PRN
  Administered 2017-02-23: 08:00:00 via INTRAVENOUS

## 2017-02-23 MED ORDER — ONDANSETRON HCL 4 MG PO TABS: 4.0000 mg | ORAL_TABLET | Freq: Four times a day (QID) | ORAL | Status: DC | PRN

## 2017-02-23 MED ORDER — LIDOCAINE-EPINEPHRINE 1 %-1:100000 IJ SOLN
INTRAMUSCULAR | Status: DC | PRN
  Administered 2017-02-23: 8 mL

## 2017-02-23 MED ORDER — ONDANSETRON HCL 4 MG/2ML IJ SOLN
INTRAMUSCULAR | Status: AC
  Filled 2017-02-23: qty 2

## 2017-02-23 MED ORDER — CYCLOBENZAPRINE HCL 10 MG PO TABS: 10.0000 mg | ORAL_TABLET | Freq: Three times a day (TID) | ORAL | 0 refills | Status: DC | PRN

## 2017-02-23 MED ORDER — SUGAMMADEX SODIUM 500 MG/5ML IV SOLN
INTRAVENOUS | Status: AC
  Filled 2017-02-23: qty 5

## 2017-02-23 MED ORDER — MEPERIDINE HCL 25 MG/ML IJ SOLN: 6.2500 mg | INTRAMUSCULAR | Status: DC | PRN

## 2017-02-23 MED ORDER — PHENYLEPHRINE HCL 10 MG/ML IJ SOLN
INTRAMUSCULAR | Status: DC | PRN
  Administered 2017-02-23: 80 ug via INTRAVENOUS

## 2017-02-23 MED ORDER — VANCOMYCIN HCL 1000 MG IV SOLR
INTRAVENOUS | Status: AC
  Filled 2017-02-23: qty 1000

## 2017-02-23 MED ORDER — PHENYLEPHRINE 40 MCG/ML (10ML) SYRINGE FOR IV PUSH (FOR BLOOD PRESSURE SUPPORT)
PREFILLED_SYRINGE | INTRAVENOUS | Status: AC
  Filled 2017-02-23: qty 10

## 2017-02-23 MED ORDER — HYDROMORPHONE HCL 1 MG/ML IJ SOLN
0.2500 mg | INTRAMUSCULAR | Status: DC | PRN
  Administered 2017-02-23 (×2): 0.5 mg via INTRAVENOUS

## 2017-02-23 MED ORDER — PROMETHAZINE HCL 25 MG/ML IJ SOLN: 6.2500 mg | INTRAMUSCULAR | Status: DC | PRN

## 2017-02-23 MED ORDER — SODIUM CHLORIDE 0.9% FLUSH: 3.0000 mL | Freq: Two times a day (BID) | INTRAVENOUS | Status: DC

## 2017-02-23 MED ORDER — SODIUM CHLORIDE 0.9% FLUSH: 3.0000 mL | INTRAVENOUS | Status: DC | PRN

## 2017-02-23 MED ORDER — PHENOL 1.4 % MT LIQD: 1.0000 | OROMUCOSAL | Status: DC | PRN

## 2017-02-23 MED ORDER — SIMVASTATIN 20 MG PO TABS: 20.0000 mg | ORAL_TABLET | Freq: Every day | ORAL | Status: DC

## 2017-02-23 MED ORDER — ONDANSETRON HCL 4 MG/2ML IJ SOLN
INTRAMUSCULAR | Status: DC | PRN
  Administered 2017-02-23: 4 mg via INTRAVENOUS

## 2017-02-23 MED ORDER — CYCLOBENZAPRINE HCL 10 MG PO TABS
10.0000 mg | ORAL_TABLET | Freq: Three times a day (TID) | ORAL | Status: DC | PRN
  Administered 2017-02-23: 10 mg via ORAL

## 2017-02-23 MED ORDER — SODIUM CHLORIDE 0.9 % IR SOLN
Status: DC | PRN
  Administered 2017-02-23: 08:00:00

## 2017-02-23 MED ORDER — MENTHOL 3 MG MT LOZG: 1.0000 | LOZENGE | OROMUCOSAL | Status: DC | PRN

## 2017-02-23 MED ORDER — DEXTROSE 5 % IV SOLN
INTRAVENOUS | Status: DC | PRN
  Administered 2017-02-23: 25 ug/min via INTRAVENOUS

## 2017-02-23 MED ORDER — ARTIFICIAL TEARS OPHTHALMIC OINT
TOPICAL_OINTMENT | OPHTHALMIC | Status: AC
  Filled 2017-02-23: qty 3.5

## 2017-02-23 SURGICAL SUPPLY — 80 items
ADH SKN CLS APL DERMABOND .7 (GAUZE/BANDAGES/DRESSINGS) ×1
APL SKNCLS STERI-STRIP NONHPOA (GAUZE/BANDAGES/DRESSINGS) ×1
BAG DECANTER FOR FLEXI CONT (MISCELLANEOUS) ×2 IMPLANT
BENZOIN TINCTURE PRP APPL 2/3 (GAUZE/BANDAGES/DRESSINGS) ×2 IMPLANT
BIT DRILL 5.0/4.0 (BIT) ×1 IMPLANT
BLADE CLIPPER SURG (BLADE) IMPLANT
BLADE SURG 11 STRL SS (BLADE) ×2 IMPLANT
BONE VIVIGEN FORMABLE 5.4CC (Bone Implant) ×2 IMPLANT
BUR CUTTER 7.0 ROUND (BURR) ×2 IMPLANT
BUR MATCHSTICK NEURO 3.0 LAGG (BURR) ×2 IMPLANT
CAGE RISE 11-17-15 10X26 (Cage) ×2 IMPLANT
CANISTER SUCT 3000ML PPV (MISCELLANEOUS) ×2 IMPLANT
CAP LOCKING (Cap) ×4 IMPLANT
CAP LOCKING 5.5 CREO (Cap) IMPLANT
CARTRIDGE OIL MAESTRO DRILL (MISCELLANEOUS) ×1 IMPLANT
CONT SPEC 4OZ CLIKSEAL STRL BL (MISCELLANEOUS) ×4 IMPLANT
COVER BACK TABLE 60X90IN (DRAPES) ×2 IMPLANT
DECANTER SPIKE VIAL GLASS SM (MISCELLANEOUS) ×2 IMPLANT
DERMABOND ADVANCED (GAUZE/BANDAGES/DRESSINGS) ×1
DERMABOND ADVANCED .7 DNX12 (GAUZE/BANDAGES/DRESSINGS) ×1 IMPLANT
DIFFUSER DRILL AIR PNEUMATIC (MISCELLANEOUS) ×2 IMPLANT
DRAPE C-ARM 42X72 X-RAY (DRAPES) ×4 IMPLANT
DRAPE C-ARMOR (DRAPES) ×1 IMPLANT
DRAPE HALF SHEET 40X57 (DRAPES) IMPLANT
DRAPE LAPAROTOMY 100X72X124 (DRAPES) ×2 IMPLANT
DRAPE POUCH INSTRU U-SHP 10X18 (DRAPES) ×2 IMPLANT
DRAPE SURG 17X23 STRL (DRAPES) ×2 IMPLANT
DRILL 5.0/4.0 (BIT) ×2
DRSG OPSITE POSTOP 4X6 (GAUZE/BANDAGES/DRESSINGS) ×1 IMPLANT
DURAPREP 26ML APPLICATOR (WOUND CARE) ×2 IMPLANT
ELECT REM PT RETURN 9FT ADLT (ELECTROSURGICAL) ×2
ELECTRODE REM PT RTRN 9FT ADLT (ELECTROSURGICAL) ×1 IMPLANT
EVACUATOR 3/16  PVC DRAIN (DRAIN) ×1
EVACUATOR 3/16 PVC DRAIN (DRAIN) IMPLANT
FIBER CORTICAL ALLOFUSE 1CC (Bone Implant) ×1 IMPLANT
GAUZE SPONGE 4X4 12PLY STRL (GAUZE/BANDAGES/DRESSINGS) ×2 IMPLANT
GAUZE SPONGE 4X4 16PLY XRAY LF (GAUZE/BANDAGES/DRESSINGS) IMPLANT
GLOVE BIO SURGEON STRL SZ7 (GLOVE) IMPLANT
GLOVE BIO SURGEON STRL SZ8 (GLOVE) ×4 IMPLANT
GLOVE BIOGEL PI IND STRL 7.0 (GLOVE) IMPLANT
GLOVE BIOGEL PI INDICATOR 7.0 (GLOVE)
GLOVE ECLIPSE 7.5 STRL STRAW (GLOVE) IMPLANT
GLOVE EXAM NITRILE LRG STRL (GLOVE) IMPLANT
GLOVE EXAM NITRILE XL STR (GLOVE) IMPLANT
GLOVE EXAM NITRILE XS STR PU (GLOVE) IMPLANT
GLOVE INDICATOR 8.5 STRL (GLOVE) ×4 IMPLANT
GOWN STRL REUS W/ TWL LRG LVL3 (GOWN DISPOSABLE) IMPLANT
GOWN STRL REUS W/ TWL XL LVL3 (GOWN DISPOSABLE) ×2 IMPLANT
GOWN STRL REUS W/TWL 2XL LVL3 (GOWN DISPOSABLE) IMPLANT
GOWN STRL REUS W/TWL LRG LVL3 (GOWN DISPOSABLE) ×6
GOWN STRL REUS W/TWL XL LVL3 (GOWN DISPOSABLE) ×4
GRAFT BNE MATRIX VG FRMBL MD 5 (Bone Implant) IMPLANT
HEMOSTAT POWDER KIT SURGIFOAM (HEMOSTASIS) IMPLANT
KIT BASIN OR (CUSTOM PROCEDURE TRAY) ×2 IMPLANT
KIT ROOM TURNOVER OR (KITS) ×2 IMPLANT
MILL MEDIUM DISP (BLADE) ×2 IMPLANT
NDL HYPO 21X1.5 SAFETY (NEEDLE) IMPLANT
NDL HYPO 25X1 1.5 SAFETY (NEEDLE) ×1 IMPLANT
NEEDLE HYPO 21X1.5 SAFETY (NEEDLE) ×2 IMPLANT
NEEDLE HYPO 25X1 1.5 SAFETY (NEEDLE) ×2 IMPLANT
NS IRRIG 1000ML POUR BTL (IV SOLUTION) ×2 IMPLANT
OIL CARTRIDGE MAESTRO DRILL (MISCELLANEOUS) ×2
PACK LAMINECTOMY NEURO (CUSTOM PROCEDURE TRAY) ×2 IMPLANT
PAD ARMBOARD 7.5X6 YLW CONV (MISCELLANEOUS) ×6 IMPLANT
ROD SPINAL 35MM (Rod) ×2 IMPLANT
SHAFT CREO 30MM (Neuro Prosthesis/Implant) ×8 IMPLANT
SPONGE LAP 4X18 X RAY DECT (DISPOSABLE) IMPLANT
SPONGE SURGIFOAM ABS GEL 100 (HEMOSTASIS) ×2 IMPLANT
STRIP CLOSURE SKIN 1/2X4 (GAUZE/BANDAGES/DRESSINGS) ×3 IMPLANT
SUT VIC AB 0 CT1 18XCR BRD8 (SUTURE) ×2 IMPLANT
SUT VIC AB 0 CT1 8-18 (SUTURE) ×4
SUT VIC AB 2-0 CT1 18 (SUTURE) ×2 IMPLANT
SUT VIC AB 4-0 PS2 27 (SUTURE) ×2 IMPLANT
SYR CONTROL 10ML LL (SYRINGE) ×1 IMPLANT
SYRINGE 20CC LL (MISCELLANEOUS) ×1 IMPLANT
TOWEL GREEN STERILE (TOWEL DISPOSABLE) ×2 IMPLANT
TOWEL GREEN STERILE FF (TOWEL DISPOSABLE) ×2 IMPLANT
TRAY FOLEY W/METER SILVER 16FR (SET/KITS/TRAYS/PACK) ×2 IMPLANT
TULIP CREP AMP 5.5MM (Orthopedic Implant) ×4 IMPLANT
WATER STERILE IRR 1000ML POUR (IV SOLUTION) ×2 IMPLANT

## 2017-02-23 NOTE — Anesthesia Procedure Notes (Signed)
Procedure Name: Intubation Date/Time: 02/23/2017 7:36 AM Performed by: Mariea Clonts, CRNA Pre-anesthesia Checklist: Patient identified, Emergency Drugs available, Suction available and Patient being monitored Patient Re-evaluated:Patient Re-evaluated prior to induction Oxygen Delivery Method: Circle System Utilized Preoxygenation: Pre-oxygenation with 100% oxygen Induction Type: IV induction Ventilation: Mask ventilation without difficulty Laryngoscope Size: Miller and 3 Grade View: Grade I Tube type: Oral Tube size: 7.5 mm Number of attempts: 1 Airway Equipment and Method: Stylet and Oral airway Placement Confirmation: ETT inserted through vocal cords under direct vision,  positive ETCO2 and breath sounds checked- equal and bilateral Tube secured with: Tape Dental Injury: Teeth and Oropharynx as per pre-operative assessment

## 2017-02-23 NOTE — Evaluation (Signed)
Physical Therapy Evaluation Patient Details Name: Jon [facility] MRN: [id number] DOB: 02/07/1951 Today's Date: 02/23/2017   History of Present Illness  Pt is a 67 y/o male s/p L4-5 PLIF. PMH includs HTN, melanoma, and R THA.   Clinical Impression  Patient is s/p above surgery resulting in the deficits listed below (see PT Problem List). PTA, pt was independent with functional mobility. Upon eval, pt presenting with post op pain and weakness, and slightly decreased balance. Required min to min guard assist with mobility this session. Reports wife will be able to assist as needed upon d/c and has all necessary DME. Patient will benefit from skilled PT to increase their independence and safety with mobility (while adhering to their precautions) to allow discharge to the venue listed below. Will continue to follow acutely.       Follow Up Recommendations No PT follow up;Supervision for mobility/OOB    Equipment Recommendations  None recommended by PT    Recommendations for Other Services       Precautions / Restrictions Precautions Precautions: Back Precaution Booklet Issued: Yes (comment) Precaution Comments: REviewed back precautions with pt.  Required Braces or Orthoses: Spinal Brace Spinal Brace: Lumbar corset;Applied in sitting position Restrictions Weight Bearing Restrictions: No      Mobility  Bed Mobility Overal bed mobility: Needs Assistance Bed Mobility: Rolling;Sidelying to Sit Rolling: Min assist Sidelying to sit: Min guard       General bed mobility comments: Min A for rolling and min guard for safety for trunk elevation. Verbal cues to maintain log roll technique.   Transfers Overall transfer level: Needs assistance Equipment used: 1 person hand held assist Transfers: Sit to/from Stand Sit to Stand: Min assist         General transfer comment: Min A for lift assist and steadying assist.   Ambulation/Gait Ambulation/Gait assistance: Min  guard Ambulation Distance (Feet): 300 Feet Assistive device: (IV pole) Gait Pattern/deviations: Step-through pattern;Decreased stride length Gait velocity: Decreased Gait velocity interpretation: Below normal speed for age/gender General Gait Details: Pt slightly shaky and unsteady during gait, however, no overt LOB noted. REquired min guard and use of IV pole for steadying. Educated about generalized walking program to perform at home.   Stairs            Wheelchair Mobility    Modified Rankin (Stroke Patients Only)       Balance Overall balance assessment: Needs assistance Sitting-balance support: No upper extremity supported;Feet supported Sitting balance-Leahy Scale: Good     Standing balance support: Single extremity supported;No upper extremity supported;During functional activity Standing balance-Leahy Scale: Fair Standing balance comment: Able to maintain static standing without UE support.                              Pertinent Vitals/Pain Pain Assessment: Faces Faces Pain Scale: Hurts a little bit Pain Location: back  Pain Descriptors / Indicators: Grimacing Pain Intervention(s): Limited activity within patient's tolerance;Monitored during session;Repositioned    Home Living Family/patient expects to be discharged to:: Private residence Living Arrangements: Spouse/significant other Available Help at Discharge: Family;Available 24 hours/day Type of Home: House Home Access: Stairs to enter Entrance Stairs-Rails: Right Entrance Stairs-Number of Steps: 3 Home Layout: One level Home Equipment: Walker - 2 wheels;Bedside commode      Prior Function Level of Independence: Independent               Hand Dominance   Dominant Hand: Right  Extremity/Trunk Assessment   Upper Extremity Assessment Upper Extremity Assessment: Defer to OT evaluation    Lower Extremity Assessment Lower Extremity Assessment: RLE deficits/detail;Generalized  weakness RLE Deficits / Details: Reports RLE pain is better than it was before surgery     Cervical / Trunk Assessment Cervical / Trunk Assessment: Other exceptions Cervical / Trunk Exceptions: s/p PLIF   Communication   Communication: No difficulties  Cognition Arousal/Alertness: Awake/alert Behavior During Therapy: WFL for tasks assessed/performed Overall Cognitive Status: Within Functional Limits for tasks assessed                                        General Comments General comments (skin integrity, edema, etc.): Pt's wife present during session.     Exercises     Assessment/Plan    PT Assessment Patient needs continued PT services  PT Problem List Decreased strength;Decreased balance;Decreased mobility;Decreased knowledge of precautions;Pain       PT Treatment Interventions Gait training;Stair training;Functional mobility training;Therapeutic activities;Therapeutic exercise;Balance training;Neuromuscular re-education;Patient/family education    PT Goals (Current goals can be found in the Care Plan section)  Acute Rehab PT Goals Patient Stated Goal: to go home tomorrow  PT Goal Formulation: With patient Time For Goal Achievement: 03/09/17 Potential to Achieve Goals: Good    Frequency Min 5X/week   Barriers to discharge        Co-evaluation               AM-PAC PT "6 Clicks" Daily Activity  Outcome Measure Difficulty turning over in bed (including adjusting bedclothes, sheets and blankets)?: Unable Difficulty moving from lying on back to sitting on the side of the bed? : Unable Difficulty sitting down on and standing up from a chair with arms (e.g., wheelchair, bedside commode, etc,.)?: Unable Help needed moving to and from a bed to chair (including a wheelchair)?: A Little Help needed walking in hospital room?: A Little Help needed climbing 3-5 steps with a railing? : A Little 6 Click Score: 12    End of Session Equipment Utilized  During Treatment: Gait belt;Back brace Activity Tolerance: Patient tolerated treatment well Patient left: in chair;with call bell/phone within reach;with family/visitor present Nurse Communication: Mobility status PT Visit Diagnosis: Other abnormalities of gait and mobility (R26.89);Unsteadiness on feet (R26.81);Pain Pain - part of body: (back )    Time: 9509-3267 PT Time Calculation (min) (ACUTE ONLY): 28 min   Charges:   PT Evaluation $PT Eval Low Complexity: 1 Low PT Treatments $Gait Training: 8-22 mins   PT G Codes:        Leighton Ruff, PT, DPT  Acute Rehabilitation Services  Pager: [phone]   Rudean Hitt 02/23/2017, 2:18 PM

## 2017-02-23 NOTE — Transfer of Care (Signed)
Immediate Anesthesia Transfer of Care Note  Patient: Jon Choi  Procedure(s) Performed: Posterior Lumbar Interbody Fusion - Lumbar four-Lumbar five (N/A Back)  Patient Location: PACU  Anesthesia Type:General  Level of Consciousness: awake, alert  and oriented  Airway & Oxygen Therapy: Patient Spontanous Breathing and Patient connected to nasal cannula oxygen  Post-op Assessment: Report given to RN and Post -op Vital signs reviewed and stable  Post vital signs: Reviewed and stable  Last Vitals:  Vitals:   02/23/17 0557 02/23/17 1105  BP: 115/80 (!) 144/84  Pulse:  83  Resp:  15  Temp:  (!) 36.3 C  SpO2:  99%    Last Pain:  Vitals:   02/23/17 1105  TempSrc:   PainSc: (P) 0-No pain      Patients Stated Pain Goal: 3 (96/78/93 8101)  Complications: No apparent anesthesia complications

## 2017-02-23 NOTE — Op Note (Signed)
Preoperative diagnosis: Grade 1 spondylolisthesis severe lumbar spinal stenosis L4-L5 with bilateral left greater than right L4 and L5 radiculopathies  Postoperative diagnosis: Same  Procedure: Decompressive lumbar laminotomy with complete medial facetectomies radical foraminotomies in excess and requiring more work than would be needed with a standard interbody fusion at L4-5  #2 posterior lumbar interbody fusion utilizing the globus rise expandable cage system packed with locally option autograft mixed with vivigen and cortical fibers  #3 Cortical screw fixation L4-5 utilizing the globus Creole and modular cortical screw set  Posterior lateral arthrodesis L4-5 utilizing the locally harvested autograft mix  Surgeon: Dominica Severin Levina Boyack  Asst.: Glenford Peers  Anesthesia: Gen.  EBL: Minimal  History of present illness: 67 year old gentleman with long-standing back pain bilateral leg pain consistent with an L4 and L5 nerve root pattern workup revealed large disc herniation L4-5 grade 1 spinal listhesis and severe spinal stenosis that level. Due to patient's failure conservative treatment imaging findings and progressive clinical syndrome I recommended decompression stabilization procedure at L4-5. I extensively went over the risks and benefits of that operation with him as well as perioperative course expectations of outcome and alternatives of surgery and he understands and agrees to proceed forward.  Operative procedure: Patient brought into the or was just under osseous admission prone the Wilson frame his back was prepped and draped in routine sterile fashion preoperative localizing appropriate level so after infiltration of 10 mL lidocaine with epi a midline incision was made and Bovie light cautery was used to take down the subcutaneous tissues and subperiosteal dissections care lamina of L4 and L5 bilaterally I exposed the facet joints at L4-5 and the interest that the L3-4 facet interoperative  x-ray confirmed the location proper level so the spinous processes at L4 smooth central decompression was begun. Lamina is noted markedly steep angle and thickened this was all drilled away and removed in piecemeal fashion complete medial facetectomies were performed and aggressive under biting of the superior tickling facet a ladder axis to the lateral margins disc space. There was a disc herniation that was medially apparent still partially contained ligament left this was incised and several large fibrous disc removed from underneath the left L4 nerve root. Then disc spaces cleanout bilaterally several large free fragments of disc removed from behind the L4 vertebral body as well as central disc was removed from within the disc space. Endplates were prepared sequential distraction with an 11 distractor in place lab and placed 10 the 17 expandable cages. These were head all been packed with the locally harvested autograft mixed with the vivigen. After all the cages were inserted and expanded the root deformity had been partially reduced. Tensions and taken the cortical screw placement utilizing fluoroscopy and a high-speed drill pilot holes were drilled then 25 mm holes were were drilled with a hand drill they will probed tapped with a 4.5 tap and then 60 by 50 30 mm modular cortical screws were inserted L4 and L5 bilaterally. All screws excellent purchase and postop fluoroscopy confirmed good position of all the implants. Then aggressive decortication was care along their residua of the facet joints laterally to the rods and screws in addition to aggressive amount of bone graft which had been packed centrally between the cages was then subsequently packed laterally to both cages. Then the rods were connected top tightness were anchored in place some additional bone graft was packed posterior laterally from the inferior aspect of the 34 facet through the bottom of the 45 facet. Gelfoam  was laid top of her after  inspection of the foramina confirm patency no migration of graft material. Then the wound was sprinkled with vancomycin powder. Exparel was injected in the fascia and the wounds closed in layers with interrupted Vicryl skin is closed running 4 subcuticular Dermabond benzo and Steri-Strips and a sterile dressing was applied patient recovered in stable condition. At the end the case all needle counts sponge counts were correct.

## 2017-02-23 NOTE — Discharge Instructions (Signed)
No lifting no bending no twisting no driving. Do not sit upright more than 30-45 minutes at a time. Do not drive a car minimize riding a car. The bandage may be removed in 3-5 days leave the Steri-Strips on and intact. Cover the Steri-Strips with saran wrap for showers only. Wear the brace whenever out of bed.

## 2017-02-23 NOTE — Anesthesia Postprocedure Evaluation (Signed)
Anesthesia Post Note  Patient: Jon Choi  Procedure(s) Performed: Posterior Lumbar Interbody Fusion - Lumbar four-Lumbar five (N/A Back)     Patient location during evaluation: PACU Anesthesia Type: General Level of consciousness: sedated and patient cooperative Pain management: pain level controlled Vital Signs Assessment: post-procedure vital signs reviewed and stable Respiratory status: spontaneous breathing Cardiovascular status: stable Anesthetic complications: no    Last Vitals:  Vitals:   02/23/17 1158 02/23/17 1219  BP: (!) 144/81 (!) 155/92  Pulse: 75 82  Resp: 11 20  Temp: 36.7 C 36.7 C  SpO2: 100% 100%    Last Pain:  Vitals:   02/23/17 1219  TempSrc: Oral  PainSc:                  [name]

## 2017-02-23 NOTE — Progress Notes (Signed)
Patient is discharged from room 3C05 at this time. Alert and in stable condition. IV site d/c'd and instructions read to patient and spouse with understanding verbalized. Left unit via wheelchair with all belongings at side. 

## 2017-02-23 NOTE — Discharge Summary (Signed)
  Physician Discharge Summary  Patient ID: Jon Choi MRN: [id number] DOB/AGE: [birth year] 67 y.o.  Admit date: 02/23/2017 Discharge date: 02/23/2017  Admission Diagnoses: Grade 1 spondylolisthesis L4-5  Discharge Diagnoses: Same Active Problems:   Spondylolisthesis at L4-L5 level   Discharged Condition: good  Hospital Course: Patient admitted hospital underwent decompression stabilization procedure at L4-5. Postoperatively patient did very well was angling voiding spontaneously tolerating regular diet stable for discharge home as he was artery take an oral pain medication. Requested being discharged today to get home head of the weather.  Consults: Significant Diagnostic Studies: Treatments: Posterior lumbar interbody fusion L4-5 Discharge Exam: Blood pressure (!) 155/92, pulse 82, temperature 98 F (36.7 C), temperature source Oral, resp. rate 20, height 6\' 1"  (1.854 m), weight 82.3 kg (181 lb 6.4 oz), SpO2 100 %. Strength 5 out of 5 wound clean dry and intact  Disposition: Home   Allergies as of 02/23/2017   No Known Allergies     Medication List    STOP taking these medications   naproxen sodium 220 MG tablet Commonly known as:  ALEVE     TAKE these medications   celecoxib 200 MG capsule Commonly known as:  CELEBREX Take 200 mg by mouth daily.   cyclobenzaprine 10 MG tablet Commonly known as:  FLEXERIL Take 1 tablet (10 mg total) by mouth 3 (three) times daily as needed for muscle spasms.   ferrous sulfate 325 (65 FE) MG tablet Take 1 tablet (325 mg total) by mouth 3 (three) times daily after meals.   metoCLOPramide 5 MG tablet Commonly known as:  REGLAN Take 1-2 tablets (5-10 mg total) by mouth every 8 (eight) hours as needed for nausea (if ondansetron (ZOFRAN) ineffective.).   metoprolol succinate 25 MG 24 hr tablet Commonly known as:  TOPROL-XL Take 50 mg by mouth daily.   mupirocin ointment 2 % Commonly known as:  BACTROBAN Apply 1 application  topically every other day.   oxyCODONE 5 MG immediate release tablet Commonly known as:  Oxy IR/ROXICODONE Take 1-2 tablets (5-10 mg total) by mouth every 4 (four) hours as needed for severe pain or moderate pain. What changed:  Another medication with the same name was added. Make sure you understand how and when to take each.   oxyCODONE 5 MG immediate release tablet Commonly known as:  Oxy IR/ROXICODONE Take 1-2 tablets (5-10 mg total) by mouth every 4 (four) hours as needed for severe pain or moderate pain. What changed:  You were already taking a medication with the same name, and this prescription was added. Make sure you understand how and when to take each.   pantoprazole 40 MG tablet Commonly known as:  PROTONIX Take 1 tablet (40 mg total) by mouth daily.   simvastatin 20 MG tablet Commonly known as:  ZOCOR Take 20 mg by mouth daily.   temazepam 30 MG capsule Commonly known as:  RESTORIL Take 30 mg by mouth at bedtime.        Signed: Suetta Hoffmeister P 02/23/2017, 4:02 PM

## 2017-02-23 NOTE — H&P (Signed)
Jon Choi is an 67 y.o. male.   Chief Complaint: Back and left greater than right leg pain HPI: 67 year old gentleman with long-standing back and bilateral leg pain worse on the left refractory to all forms of conservative treatment workup revealed severe spinal stenosis a slight listhesis a disc herniation all at L4-5 causing severe thecal sac compression. Due to patient's failure conservative treatment imaging findings and progressive conical syndrome I recommended decompression stabilization procedure at L4-5. I extensively reviewed the risks and benefits of the operation with the patient as well as perioperative course expectations of outcome and alternatives of surgery and he understood and agreed to proceed forward. 67 year old gentleman presents for decompression stabilization procedure at L4-5.  Past Medical History:  Diagnosis Date  . Arthritis   . Back pain   . History of kidney stones   . Hypertension   . Melanoma ([place]) 2010   facial--multiple excision with plastic surgery     Past Surgical History:  Procedure Laterality Date  . EXCISION OF BACK LESION    . KNEE ARTHROSCOPY     BILATERAL  . MELANOMA EXCISION     FACE +  SEVERAL SURGERIES    . TOTAL HIP ARTHROPLASTY Right 09/12/2016   Procedure: IRRIGATION AND DEBRIDEMENT HIP WITH FEMORAL HEAD AND ACETABULAR CUP EXCHANGE;  Surgeon: [name], MD;  Location: [facility];  Service: Orthopedics;  Laterality: Right;  . TOTAL HIP ARTHROPLASTY Right 2007    Family History  Problem Relation Age of Onset  . Hypertension Other    Social History:  reports that he has quit smoking. His smoking use included e-cigarettes. he has never used smokeless tobacco. He reports that he drinks alcohol. He reports that he does not use drugs.  Allergies: No Known Allergies  Medications Prior to Admission  Medication Sig Dispense Refill  . celecoxib (CELEBREX) 200 MG capsule Take 200 mg by mouth daily.   6  . metoprolol succinate (TOPROL-XL)  25 MG 24 hr tablet Take 50 mg by mouth daily.    . mupirocin ointment (BACTROBAN) 2 % Apply 1 application topically every other day.    . naproxen sodium (ANAPROX) 220 MG tablet Take 440 mg 2 (two) times daily as needed by mouth (for pain/headache).     . simvastatin (ZOCOR) 20 MG tablet Take 20 mg by mouth daily.    . temazepam (RESTORIL) 30 MG capsule Take 30 mg by mouth at bedtime.   0  . ferrous sulfate 325 (65 FE) MG tablet Take 1 tablet (325 mg total) by mouth 3 (three) times daily after meals. (Patient not taking: Reported on 11/30/2016) 30 tablet 0  . metoCLOPramide (REGLAN) 5 MG tablet Take 1-2 tablets (5-10 mg total) by mouth every 8 (eight) hours as needed for nausea (if ondansetron (ZOFRAN) ineffective.). (Patient not taking: Reported on 11/30/2016) 20 tablet 0  . oxyCODONE (OXY IR/ROXICODONE) 5 MG immediate release tablet Take 1-2 tablets (5-10 mg total) by mouth every 4 (four) hours as needed for severe pain or moderate pain. (Patient not taking: Reported on 10/12/2016) 20 tablet 0  . pantoprazole (PROTONIX) 40 MG tablet Take 1 tablet (40 mg total) by mouth daily. (Patient not taking: Reported on 11/30/2016) 30 tablet 0    No results found for this or any previous visit (from the past 48 hour(s)). No results found.  Review of Systems  Musculoskeletal: Positive for back pain and joint pain.  Neurological: Positive for tingling.    Blood pressure 115/80, pulse 67, temperature 97.6  F (36.4 C), temperature source Oral, resp. rate 19, height 6\' 1"  (1.854 m), weight 82.3 kg (181 lb 6.4 oz), SpO2 100 %. Physical Exam  Constitutional: He is oriented to person, place, and time. He appears well-developed.  HENT:  Head: Normocephalic.  Eyes: Pupils are equal, round, and reactive to light.  Neck: Normal range of motion.  Cardiovascular: Normal rate.  Respiratory: Effort normal.  GI: Soft.  Musculoskeletal: Normal range of motion.  Neurological: He is alert and oriented to person,  place, and time. He has normal strength. GCS eye subscore is 4. GCS verbal subscore is 5. GCS motor subscore is 6.  Strength is 5 of 5 iliopsoas, quads, hamstrings, gastric and tibialis, and EHL.  Skin: Skin is warm and dry.     Assessment/Plan 67 year old gentleman presents for decompression stabilization procedure at L4-5.  Nyellie Yetter P, MD 02/23/2017, 7:09 AM

## 2017-03-30 DIAGNOSIS — M4316 Spondylolisthesis, lumbar region: Secondary | ICD-10-CM | POA: Diagnosis not present

## 2017-04-04 DIAGNOSIS — M545 Low back pain: Secondary | ICD-10-CM | POA: Diagnosis not present

## 2017-04-10 DIAGNOSIS — M545 Low back pain: Secondary | ICD-10-CM | POA: Diagnosis not present

## 2017-04-12 DIAGNOSIS — M545 Low back pain: Secondary | ICD-10-CM | POA: Diagnosis not present

## 2017-04-16 DIAGNOSIS — M545 Low back pain: Secondary | ICD-10-CM | POA: Diagnosis not present

## 2017-04-19 DIAGNOSIS — M545 Low back pain: Secondary | ICD-10-CM | POA: Diagnosis not present

## 2017-04-25 DIAGNOSIS — M545 Low back pain: Secondary | ICD-10-CM | POA: Diagnosis not present

## 2017-05-02 DIAGNOSIS — M545 Low back pain: Secondary | ICD-10-CM | POA: Diagnosis not present

## 2017-05-09 DIAGNOSIS — M545 Low back pain: Secondary | ICD-10-CM | POA: Diagnosis not present

## 2017-05-16 DIAGNOSIS — M545 Low back pain: Secondary | ICD-10-CM | POA: Diagnosis not present

## 2017-05-23 DIAGNOSIS — M545 Low back pain: Secondary | ICD-10-CM | POA: Diagnosis not present

## 2017-05-30 DIAGNOSIS — M545 Low back pain: Secondary | ICD-10-CM | POA: Diagnosis not present

## 2017-06-06 DIAGNOSIS — M545 Low back pain: Secondary | ICD-10-CM | POA: Diagnosis not present

## 2017-06-13 DIAGNOSIS — M545 Low back pain: Secondary | ICD-10-CM | POA: Diagnosis not present

## 2017-08-14 DIAGNOSIS — M5440 Lumbago with sciatica, unspecified side: Secondary | ICD-10-CM | POA: Diagnosis not present

## 2017-09-12 DIAGNOSIS — M25551 Pain in right hip: Secondary | ICD-10-CM | POA: Diagnosis not present

## 2017-12-19 DIAGNOSIS — L821 Other seborrheic keratosis: Secondary | ICD-10-CM | POA: Diagnosis not present

## 2017-12-19 DIAGNOSIS — Z85828 Personal history of other malignant neoplasm of skin: Secondary | ICD-10-CM | POA: Diagnosis not present

## 2017-12-19 DIAGNOSIS — D1801 Hemangioma of skin and subcutaneous tissue: Secondary | ICD-10-CM | POA: Diagnosis not present

## 2017-12-19 DIAGNOSIS — L570 Actinic keratosis: Secondary | ICD-10-CM | POA: Diagnosis not present

## 2017-12-19 DIAGNOSIS — C44719 Basal cell carcinoma of skin of left lower limb, including hip: Secondary | ICD-10-CM | POA: Diagnosis not present

## 2017-12-21 DIAGNOSIS — Z23 Encounter for immunization: Secondary | ICD-10-CM | POA: Diagnosis not present

## 2017-12-21 DIAGNOSIS — I10 Essential (primary) hypertension: Secondary | ICD-10-CM | POA: Diagnosis not present

## 2017-12-21 DIAGNOSIS — M25551 Pain in right hip: Secondary | ICD-10-CM | POA: Diagnosis not present

## 2017-12-21 DIAGNOSIS — Z0000 Encounter for general adult medical examination without abnormal findings: Secondary | ICD-10-CM | POA: Diagnosis not present

## 2017-12-21 DIAGNOSIS — Z125 Encounter for screening for malignant neoplasm of prostate: Secondary | ICD-10-CM | POA: Diagnosis not present

## 2017-12-21 DIAGNOSIS — E782 Mixed hyperlipidemia: Secondary | ICD-10-CM | POA: Diagnosis not present

## 2017-12-27 ENCOUNTER — Inpatient Hospital Stay: Admit: 2017-12-27 | Payer: BLUE CROSS/BLUE SHIELD | Admitting: Orthopedic Surgery

## 2017-12-27 SURGERY — REIMPLANTATION OF TOTAL HIP
Anesthesia: Spinal | Site: Hip | Laterality: Right

## 2018-02-04 NOTE — H&P (Signed)
Jon Choi is an 67 y.o. male.    Chief Complaint:  S/P resection of right THA and placement of antibiotic spacer  Procedure:  Revision / reimplantation right THA with removal of antibiotic spacer  HPI: Pt is a 67 y.o. male for a revision / reimplantation right THA with removal of antibiotic spacer.  He has had his back surgery and feel that he is at a point he can have the hip revised.  X-rays in the clinic show a spacer in proper position. Pt has tried various conservative treatments which have failed to alleviate their symptoms. Various options are discussed with the patient. Risks, benefits and expectations were discussed with the patient. Patient understand the risks, benefits and expectations and wishes to proceed with surgery.    PCP: [name], MD  D/C Plans:       Home  Post-op Meds:       No Rx given   Tranexamic Acid:      To be given - IV   Decadron:      Is to be given  FYI:      ASA  [place]  DME:   Pt already has equipment   PT:   No PT      PMH: Past Medical History:  Diagnosis Date  . Arthritis   . Back pain   . History of kidney stones   . Hypertension   . Melanoma ([place]) 2010   facial--multiple excision with plastic surgery     PSH: Past Surgical History:  Procedure Laterality Date  . EXCISION OF BACK LESION    . KNEE ARTHROSCOPY     BILATERAL  . MELANOMA EXCISION     FACE +  SEVERAL SURGERIES    . TOTAL HIP ARTHROPLASTY Right 09/12/2016   Procedure: IRRIGATION AND DEBRIDEMENT HIP WITH FEMORAL HEAD AND ACETABULAR CUP EXCHANGE;  Surgeon: [name], MD;  Location: [facility];  Service: Orthopedics;  Laterality: Right;  . TOTAL HIP ARTHROPLASTY Right 2007    Social History:  reports that he has quit smoking. His smoking use included e-cigarettes. He has never used smokeless tobacco. He reports current alcohol use. He reports that he does not use drugs.  Allergies:  No Known Allergies  Medications: No current facility-administered  medications for this encounter.    Current Outpatient Medications  Medication Sig Dispense Refill  . celecoxib (CELEBREX) 200 MG capsule Take 200 mg by mouth daily.   6  . cyclobenzaprine (FLEXERIL) 10 MG tablet Take 1 tablet (10 mg total) by mouth 3 (three) times daily as needed for muscle spasms. 40 tablet 0  . ferrous sulfate 325 (65 FE) MG tablet Take 1 tablet (325 mg total) by mouth 3 (three) times daily after meals. (Patient not taking: Reported on 11/30/2016) 30 tablet 0  . metoCLOPramide (REGLAN) 5 MG tablet Take 1-2 tablets (5-10 mg total) by mouth every 8 (eight) hours as needed for nausea (if ondansetron (ZOFRAN) ineffective.). (Patient not taking: Reported on 11/30/2016) 20 tablet 0  . metoprolol succinate (TOPROL-XL) 25 MG 24 hr tablet Take 50 mg by mouth daily.    . mupirocin ointment (BACTROBAN) 2 % Apply 1 application topically every other day.    . oxyCODONE (OXY IR/ROXICODONE) 5 MG immediate release tablet Take 1-2 tablets (5-10 mg total) by mouth every 4 (four) hours as needed for severe pain or moderate pain. (Patient not taking: Reported on 10/12/2016) 20 tablet 0  . oxyCODONE (OXY IR/ROXICODONE) 5 MG immediate  release tablet Take 1-2 tablets (5-10 mg total) by mouth every 4 (four) hours as needed for severe pain or moderate pain. 40 tablet 0  . pantoprazole (PROTONIX) 40 MG tablet Take 1 tablet (40 mg total) by mouth daily. (Patient not taking: Reported on 11/30/2016) 30 tablet 0  . simvastatin (ZOCOR) 20 MG tablet Take 20 mg by mouth daily.    . temazepam (RESTORIL) 30 MG capsule Take 30 mg by mouth at bedtime.   0     Review of Systems  Constitutional: Negative.   HENT: Negative.   Eyes: Negative.   Respiratory: Negative.   Cardiovascular: Negative.   Gastrointestinal: Negative.   Genitourinary: Negative.   Musculoskeletal: Positive for back pain and joint pain.  Skin: Negative.   Neurological: Negative.   Endo/Heme/Allergies: Negative.   Psychiatric/Behavioral:  Negative.        Physical Exam  Constitutional: He is oriented to person, place, and time. He appears well-developed.  HENT:  Head: Normocephalic.  Eyes: Pupils are equal, round, and reactive to light.  Neck: Neck supple. No JVD present. No tracheal deviation present. No thyromegaly present.  Cardiovascular: Normal rate, regular rhythm and intact distal pulses.  Respiratory: Effort normal and breath sounds normal. No respiratory distress. He has no wheezes.  GI: Soft. There is no abdominal tenderness. There is no guarding.  Musculoskeletal:     Right hip: He exhibits decreased range of motion, decreased strength, tenderness and bony tenderness. He exhibits no swelling and no deformity.  Lymphadenopathy:    He has no cervical adenopathy.  Neurological: He is alert and oriented to person, place, and time.  Skin: Skin is warm and dry.  Psychiatric: He has a normal mood and affect.       Assessment/Plan Assessment:   S/P resection of right THA and placement of antibiotic spacer  Plan: Patient will undergo a revision / reimplantation right THA with removal of antibiotic spacer on 02/25/2018 per Dr. Alvan Dame at [facility]. Risks benefits and expectations were discussed with the patient. Patient understand risks, benefits and expectations and wishes to proceed.    West Pugh Daleyssa Loiselle   PA-C  02/04/2018, 11:38 AM

## 2018-02-15 ENCOUNTER — Other Ambulatory Visit (HOSPITAL_COMMUNITY): Payer: Self-pay | Admitting: *Deleted

## 2018-02-15 NOTE — Patient Instructions (Addendum)
JAHMEIR GEISEN  02/15/2018   Your procedure is scheduled on: 02-25-18  Report to [facility] Main  Entrance  Report to admitting at 100 pm     Call this number if you have problems the morning of surgery [phone]   Remember: Do not eat food After Midnight. Clear liquids from midnight until 930 am day of surgery. Nothing by mouth after 930 am. BRUSH YOUR TEETH MORNING OF SURGERY AND RINSE YOUR MOUTH OUT, NO CHEWING GUM CANDY OR MINTS.     CLEAR LIQUID DIET   Foods Allowed                                                                     Foods Excluded  Coffee and tea, regular and decaf                             liquids that you cannot  Plain Jell-O in any flavor                                             see through such as: Fruit ices (not with fruit pulp)                                     milk, soups, orange juice  Iced Popsicles                                    All solid food Carbonated beverages, regular and diet                                    Cranberry, grape and apple juices Sports drinks like Gatorade Lightly seasoned clear broth or consume(fat free) Sugar, honey syrup  Sample Menu Breakfast                                Lunch                                     Supper Cranberry juice                    Beef broth                            Chicken broth Jell-O                                     Grape juice  Apple juice Coffee or tea                        Jell-O                                      Popsicle                                                Coffee or tea                        Coffee or tea  _____________________________________________________________________     Take these medicines the morning of surgery with A SIP OF WATER:  oxycodone if needed                               You may not have any metal on your body including hair pins and              piercings  Do not wear jewelry,  make-up, lotions, powders or perfumes, deodorant             Do not wear nail polish.  Do not shave  48 hours prior to surgery.              Men may shave face and neck.   Do not bring valuables to the hospital. [place].  Contacts, dentures or bridgework may not be worn into surgery.  Leave suitcase in the car. After surgery it may be brought to your room.                  Please read over the following fact sheets you were given: _____________________________________________________________________   [facility] - Preparing for Surgery Before surgery, you can play an important role.  Because skin is not sterile, your skin needs to be as free of germs as possible.  You can reduce the number of germs on your skin by washing with CHG (chlorahexidine gluconate) soap before surgery.  CHG is an antiseptic cleaner which kills germs and bonds with the skin to continue killing germs even after washing. Please DO NOT use if you have an allergy to CHG or antibacterial soaps.  If your skin becomes reddened/irritated stop using the CHG and inform your nurse when you arrive at Short Stay. Do not shave (including legs and underarms) for at least 48 hours prior to the first CHG shower.  You may shave your face/neck. Please follow these instructions carefully:  1.  Shower with CHG Soap the night before surgery and the  morning of Surgery.  2.  If you choose to wash your hair, wash your hair first as usual with your  normal  shampoo.  3.  After you shampoo, rinse your hair and body thoroughly to remove the  shampoo.                           4.  Use CHG as you would any other liquid soap.  You can apply chg directly  to the skin and wash                       Gently with a scrungie or clean washcloth.  5.  Apply the CHG Soap to your body ONLY FROM THE NECK DOWN.   Do not use on face/ open                           Wound or open sores. Avoid contact with  eyes, ears mouth and genitals (private parts).                       Wash face,  Genitals (private parts) with your normal soap.             6.  Wash thoroughly, paying special attention to the area where your surgery  will be performed.  7.  Thoroughly rinse your body with warm water from the neck down.  8.  DO NOT shower/wash with your normal soap after using and rinsing off  the CHG Soap.                9.  Pat yourself dry with a clean towel.            10.  Wear clean pajamas.            11.  Place clean sheets on your bed the night of your first shower and do not  sleep with pets. Day of Surgery : Do not apply any lotions/deodorants the morning of surgery.  Please wear clean clothes to the hospital/surgery center.  FAILURE TO FOLLOW THESE INSTRUCTIONS MAY RESULT IN THE CANCELLATION OF YOUR SURGERY PATIENT SIGNATURE_________________________________  NURSE SIGNATURE__________________________________  ________________________________________________________________________   Adam Phenix  An incentive spirometer is a tool that can help keep your lungs clear and active. This tool measures how well you are filling your lungs with each breath. Taking long deep breaths may help reverse or decrease the chance of developing breathing (pulmonary) problems (especially infection) following:  A long period of time when you are unable to move or be active. BEFORE THE PROCEDURE   If the spirometer includes an indicator to show your best effort, your nurse or respiratory therapist will set it to a desired goal.  If possible, sit up straight or lean slightly forward. Try not to slouch.  Hold the incentive spirometer in an upright position. INSTRUCTIONS FOR USE  1. Sit on the edge of your bed if possible, or sit up as far as you can in bed or on a chair. 2. Hold the incentive spirometer in an upright position. 3. Breathe out normally. 4. Place the mouthpiece in your mouth and seal your  lips tightly around it. 5. Breathe in slowly and as deeply as possible, raising the piston or the ball toward the top of the column. 6. Hold your breath for 3-5 seconds or for as long as possible. Allow the piston or ball to fall to the bottom of the column. 7. Remove the mouthpiece from your mouth and breathe out normally. 8. Rest for a few seconds and repeat Steps 1 through 7 at least 10 times every 1-2 hours when you are awake. Take your time and take a few normal breaths between deep breaths. 9. The spirometer may include an indicator to show your best effort. Use the indicator as a goal to work toward during each repetition. 10. After  each set of 10 deep breaths, practice coughing to be sure your lungs are clear. If you have an incision (the cut made at the time of surgery), support your incision when coughing by placing a pillow or rolled up towels firmly against it. Once you are able to get out of bed, walk around indoors and cough well. You may stop using the incentive spirometer when instructed by your caregiver.  RISKS AND COMPLICATIONS  Take your time so you do not get dizzy or light-headed.  If you are in pain, you may need to take or ask for pain medication before doing incentive spirometry. It is harder to take a deep breath if you are having pain. AFTER USE  Rest and breathe slowly and easily.  It can be helpful to keep track of a log of your progress. Your caregiver can provide you with a simple table to help with this. If you are using the spirometer at home, follow these instructions: [place] IF:   You are having difficultly using the spirometer.  You have trouble using the spirometer as often as instructed.  Your pain medication is not giving enough relief while using the spirometer.  You develop fever of 100.5 F (38.1 C) or higher. SEEK IMMEDIATE MEDICAL CARE IF:   You cough up bloody sputum that had not been present before.  You develop fever of 102 F  (38.9 C) or greater.  You develop worsening pain at or near the incision site. MAKE SURE YOU:   Understand these instructions.  Will watch your condition.  Will get help right away if you are not doing well or get worse. Document Released: 06/12/2006 Document Revised: 04/24/2011 Document Reviewed: 08/13/2006 [facility] Patient Information 2014 [place], [place].   ________________________________________________________________________

## 2018-02-18 ENCOUNTER — Encounter (HOSPITAL_COMMUNITY): Payer: Self-pay

## 2018-02-18 ENCOUNTER — Encounter (HOSPITAL_COMMUNITY)
Admission: RE | Admit: 2018-02-18 | Discharge: 2018-02-18 | Disposition: A | Discharge status: Home / Self Care | Payer: BLUE CROSS/BLUE SHIELD | Source: Ambulatory Visit | Attending: Orthopedic Surgery | Admitting: Orthopedic Surgery

## 2018-02-18 ENCOUNTER — Other Ambulatory Visit: Payer: Self-pay

## 2018-02-18 DIAGNOSIS — R000 Tachycardia, unspecified: Secondary | ICD-10-CM | POA: Diagnosis not present

## 2018-02-18 DIAGNOSIS — I10 Essential (primary) hypertension: Secondary | ICD-10-CM | POA: Insufficient documentation

## 2018-02-18 DIAGNOSIS — Z01818 Encounter for other preprocedural examination: Secondary | ICD-10-CM | POA: Insufficient documentation

## 2018-02-18 DIAGNOSIS — R001 Bradycardia, unspecified: Secondary | ICD-10-CM | POA: Insufficient documentation

## 2018-02-18 HISTORY — DX: Insomnia, unspecified: G47.00

## 2018-02-18 LAB — BASIC METABOLIC PANEL
Anion gap: 9 (ref 5–15)
BUN: 13 mg/dL (ref 8–23)
CO2: 25 mmol/L (ref 22–32)
Calcium: 9 mg/dL (ref 8.9–10.3)
Chloride: 106 mmol/L (ref 98–111)
Creatinine, Ser: 1.06 mg/dL (ref 0.61–1.24)
GFR calc Af Amer: >60 mL/min (ref >60)
GFR calc non Af Amer: >60 mL/min (ref >60)
Glucose, Bld: 90 mg/dL (ref 70–99)
Potassium: 4.1 mmol/L (ref 3.5–5.1)
Sodium: 140 mmol/L (ref 135–145)

## 2018-02-18 LAB — SURGICAL PCR SCREEN
MRSA, PCR: NEGATIVE
Staphylococcus aureus: NEGATIVE

## 2018-02-18 LAB — CBC
HCT: 43.9 % (ref 39.0–52.0)
HEMOGLOBIN: 14.2 g/dL (ref 13.0–17.0)
MCH: 32.2 pg (ref 26.0–34.0)
MCHC: 32.3 g/dL (ref 30.0–36.0)
MCV: 99.5 fL (ref 80.0–100.0)
Platelets: 268 10*3/uL (ref 150–400)
RBC: 4.41 MIL/uL (ref 4.22–5.81)
RDW: 11.9 % (ref 11.5–15.5)
WBC: 8 10*3/uL (ref 4.0–10.5)
nRBC: 0 % (ref 0.0–0.2)

## 2018-02-19 NOTE — Progress Notes (Signed)
Medical clearance d r shaw 01-07-18 on chart lov dr Brigitte Pulse 12-21-17 on chart

## 2018-02-25 ENCOUNTER — Encounter (HOSPITAL_COMMUNITY): Payer: Self-pay | Admitting: *Deleted

## 2018-02-25 ENCOUNTER — Encounter (HOSPITAL_COMMUNITY)
Admission: RE | Disposition: A | Discharge status: Home / Self Care | Payer: Self-pay | Source: Home / Self Care | Attending: Orthopedic Surgery

## 2018-02-25 ENCOUNTER — Inpatient Hospital Stay (HOSPITAL_COMMUNITY): Payer: BLUE CROSS/BLUE SHIELD | Admitting: Physician Assistant

## 2018-02-25 ENCOUNTER — Inpatient Hospital Stay: Payer: Self-pay

## 2018-02-25 ENCOUNTER — Other Ambulatory Visit: Payer: Self-pay

## 2018-02-25 ENCOUNTER — Inpatient Hospital Stay (HOSPITAL_COMMUNITY): Payer: BLUE CROSS/BLUE SHIELD | Admitting: Certified Registered Nurse Anesthetist

## 2018-02-25 ENCOUNTER — Inpatient Hospital Stay (HOSPITAL_COMMUNITY)
Admission: RE | Admit: 2018-02-25 | Discharge: 2018-02-27 | DRG: 468 | Disposition: A | Discharge status: Home / Self Care | Payer: BLUE CROSS/BLUE SHIELD | Attending: Orthopedic Surgery | Admitting: Orthopedic Surgery

## 2018-02-25 DIAGNOSIS — Z8739 Personal history of other diseases of the musculoskeletal system and connective tissue: Secondary | ICD-10-CM | POA: Diagnosis not present

## 2018-02-25 DIAGNOSIS — Z87442 Personal history of urinary calculi: Secondary | ICD-10-CM | POA: Diagnosis not present

## 2018-02-25 DIAGNOSIS — I10 Essential (primary) hypertension: Secondary | ICD-10-CM | POA: Diagnosis present

## 2018-02-25 DIAGNOSIS — M549 Dorsalgia, unspecified: Secondary | ICD-10-CM | POA: Diagnosis present

## 2018-02-25 DIAGNOSIS — Z79891 Long term (current) use of opiate analgesic: Secondary | ICD-10-CM

## 2018-02-25 DIAGNOSIS — Z79899 Other long term (current) drug therapy: Secondary | ICD-10-CM

## 2018-02-25 DIAGNOSIS — Z85820 Personal history of malignant melanoma of skin: Secondary | ICD-10-CM | POA: Diagnosis not present

## 2018-02-25 DIAGNOSIS — Y831 Surgical operation with implant of artificial internal device as the cause of abnormal reaction of the patient, or of later complication, without mention of misadventure at the time of the procedure: Secondary | ICD-10-CM | POA: Diagnosis not present

## 2018-02-25 DIAGNOSIS — Z981 Arthrodesis status: Secondary | ICD-10-CM | POA: Diagnosis not present

## 2018-02-25 DIAGNOSIS — E785 Hyperlipidemia, unspecified: Secondary | ICD-10-CM | POA: Diagnosis not present

## 2018-02-25 DIAGNOSIS — Z95828 Presence of other vascular implants and grafts: Secondary | ICD-10-CM | POA: Diagnosis not present

## 2018-02-25 DIAGNOSIS — M48061 Spinal stenosis, lumbar region without neurogenic claudication: Secondary | ICD-10-CM | POA: Diagnosis not present

## 2018-02-25 DIAGNOSIS — Z96641 Presence of right artificial hip joint: Secondary | ICD-10-CM | POA: Diagnosis present

## 2018-02-25 DIAGNOSIS — Z8249 Family history of ischemic heart disease and other diseases of the circulatory system: Secondary | ICD-10-CM

## 2018-02-25 DIAGNOSIS — Z8619 Personal history of other infectious and parasitic diseases: Secondary | ICD-10-CM | POA: Diagnosis not present

## 2018-02-25 DIAGNOSIS — Z22321 Carrier or suspected carrier of Methicillin susceptible Staphylococcus aureus: Secondary | ICD-10-CM

## 2018-02-25 DIAGNOSIS — T8451XA Infection and inflammatory reaction due to internal right hip prosthesis, initial encounter: Secondary | ICD-10-CM | POA: Diagnosis not present

## 2018-02-25 DIAGNOSIS — T8451XD Infection and inflammatory reaction due to internal right hip prosthesis, subsequent encounter: Secondary | ICD-10-CM | POA: Diagnosis not present

## 2018-02-25 DIAGNOSIS — Z87891 Personal history of nicotine dependence: Secondary | ICD-10-CM

## 2018-02-25 HISTORY — PX: REIMPLANTATION OF TOTAL HIP: SHX6051

## 2018-02-25 LAB — TYPE AND SCREEN
ABO/RH(D): A POS
Antibody Screen: NEGATIVE

## 2018-02-25 SURGERY — REIMPLANTATION OF TOTAL HIP
Anesthesia: General | Site: Hip | Laterality: Right

## 2018-02-25 MED ORDER — METHOCARBAMOL 500 MG IVPB - SIMPLE MED
INTRAVENOUS | Status: AC
  Administered 2018-02-25: 500 mg via INTRAVENOUS
  Filled 2018-02-25: qty 50

## 2018-02-25 MED ORDER — POLYETHYLENE GLYCOL 3350 17 G PO PACK
17.0000 g | PACK | Freq: Two times a day (BID) | ORAL | Status: DC
  Administered 2018-02-25 – 2018-02-26 (×3): 17 g via ORAL
  Filled 2018-02-25 (×4): qty 1

## 2018-02-25 MED ORDER — TRANEXAMIC ACID-NACL 1000-0.7 MG/100ML-% IV SOLN
1000.0000 mg | Freq: Once | INTRAVENOUS | Status: AC
  Administered 2018-02-25: 1000 mg via INTRAVENOUS
  Filled 2018-02-25: qty 100

## 2018-02-25 MED ORDER — ALUM & MAG HYDROXIDE-SIMETH 200-200-20 MG/5ML PO SUSP: 15.0000 mL | ORAL | Status: DC | PRN

## 2018-02-25 MED ORDER — VANCOMYCIN HCL 1000 MG IV SOLR
INTRAVENOUS | Status: AC
  Filled 2018-02-25: qty 1000

## 2018-02-25 MED ORDER — METHOCARBAMOL 500 MG PO TABS
500.0000 mg | ORAL_TABLET | Freq: Four times a day (QID) | ORAL | Status: DC | PRN
  Administered 2018-02-26 – 2018-02-27 (×2): 500 mg via ORAL
  Filled 2018-02-25 (×2): qty 1

## 2018-02-25 MED ORDER — EPHEDRINE 5 MG/ML INJ
INTRAVENOUS | Status: AC
  Filled 2018-02-25: qty 10

## 2018-02-25 MED ORDER — PROPOFOL 10 MG/ML IV BOLUS
INTRAVENOUS | Status: DC | PRN
  Administered 2018-02-25: 150 mg via INTRAVENOUS

## 2018-02-25 MED ORDER — 0.9 % SODIUM CHLORIDE (POUR BTL) OPTIME
TOPICAL | Status: DC | PRN
  Administered 2018-02-25: 1000 mL

## 2018-02-25 MED ORDER — SUGAMMADEX SODIUM 500 MG/5ML IV SOLN
INTRAVENOUS | Status: AC
  Filled 2018-02-25: qty 5

## 2018-02-25 MED ORDER — METHOCARBAMOL 500 MG IVPB - SIMPLE MED
500.0000 mg | Freq: Four times a day (QID) | INTRAVENOUS | Status: DC | PRN
  Administered 2018-02-25: 500 mg via INTRAVENOUS
  Filled 2018-02-25: qty 50

## 2018-02-25 MED ORDER — ONDANSETRON HCL 4 MG/2ML IJ SOLN
INTRAMUSCULAR | Status: DC | PRN
Start: 2018-02-25 — End: 2018-02-25
  Administered 2018-02-25: 4 mg via INTRAVENOUS

## 2018-02-25 MED ORDER — LIDOCAINE 2% (20 MG/ML) 5 ML SYRINGE
INTRAMUSCULAR | Status: DC | PRN
  Administered 2018-02-25: 80 mg via INTRAVENOUS

## 2018-02-25 MED ORDER — VANCOMYCIN HCL IN DEXTROSE 1-5 GM/200ML-% IV SOLN
1000.0000 mg | Freq: Two times a day (BID) | INTRAVENOUS | Status: DC
  Administered 2018-02-26 – 2018-02-27 (×3): 1000 mg via INTRAVENOUS
  Filled 2018-02-25 (×4): qty 200

## 2018-02-25 MED ORDER — VANCOMYCIN HCL 1000 MG IV SOLR
INTRAVENOUS | Status: DC | PRN
  Administered 2018-02-25: 2 g
  Administered 2018-02-25: 1000 mg via TOPICAL

## 2018-02-25 MED ORDER — DEXAMETHASONE SODIUM PHOSPHATE 10 MG/ML IJ SOLN
INTRAMUSCULAR | Status: AC
  Filled 2018-02-25: qty 1

## 2018-02-25 MED ORDER — SODIUM CHLORIDE 0.9 % IR SOLN
Status: DC | PRN
  Administered 2018-02-25: 6000 mL

## 2018-02-25 MED ORDER — TOBRAMYCIN SULFATE 1.2 G IJ SOLR
INTRAMUSCULAR | Status: DC | PRN
  Administered 2018-02-25: 2.4 g

## 2018-02-25 MED ORDER — OXYCODONE HCL 5 MG PO TABS
5.0000 mg | ORAL_TABLET | ORAL | Status: DC | PRN
  Administered 2018-02-26 – 2018-02-27 (×3): 5 mg via ORAL
  Filled 2018-02-25 (×3): qty 1

## 2018-02-25 MED ORDER — ROCURONIUM BROMIDE 50 MG/5ML IV SOSY
PREFILLED_SYRINGE | INTRAVENOUS | Status: DC | PRN
  Administered 2018-02-25: 20 mg via INTRAVENOUS
  Administered 2018-02-25: 50 mg via INTRAVENOUS

## 2018-02-25 MED ORDER — PROPOFOL 10 MG/ML IV BOLUS
INTRAVENOUS | Status: AC
  Filled 2018-02-25: qty 40

## 2018-02-25 MED ORDER — TEMAZEPAM 15 MG PO CAPS
30.0000 mg | ORAL_CAPSULE | Freq: Every day | ORAL | Status: DC
  Administered 2018-02-25 – 2018-02-26 (×2): 30 mg via ORAL
  Filled 2018-02-25 (×2): qty 2

## 2018-02-25 MED ORDER — CELECOXIB 200 MG PO CAPS
200.0000 mg | ORAL_CAPSULE | Freq: Two times a day (BID) | ORAL | Status: DC
  Administered 2018-02-25 – 2018-02-27 (×4): 200 mg via ORAL
  Filled 2018-02-25 (×4): qty 1

## 2018-02-25 MED ORDER — PHENOL 1.4 % MT LIQD
1.0000 | OROMUCOSAL | Status: DC | PRN
  Filled 2018-02-25: qty 177

## 2018-02-25 MED ORDER — ONDANSETRON HCL 4 MG/2ML IJ SOLN
INTRAMUSCULAR | Status: AC
  Filled 2018-02-25: qty 2

## 2018-02-25 MED ORDER — ASPIRIN 81 MG PO CHEW
81.0000 mg | CHEWABLE_TABLET | Freq: Two times a day (BID) | ORAL | Status: DC
  Administered 2018-02-25 – 2018-02-27 (×4): 81 mg via ORAL
  Filled 2018-02-25 (×4): qty 1

## 2018-02-25 MED ORDER — DOCUSATE SODIUM 100 MG PO CAPS
100.0000 mg | ORAL_CAPSULE | Freq: Two times a day (BID) | ORAL | Status: DC
  Administered 2018-02-25 – 2018-02-27 (×4): 100 mg via ORAL
  Filled 2018-02-25 (×4): qty 1

## 2018-02-25 MED ORDER — FERROUS SULFATE 325 (65 FE) MG PO TABS
325.0000 mg | ORAL_TABLET | Freq: Three times a day (TID) | ORAL | Status: DC
  Administered 2018-02-25 – 2018-02-27 (×6): 325 mg via ORAL
  Filled 2018-02-25 (×7): qty 1

## 2018-02-25 MED ORDER — TOBRAMYCIN SULFATE 1.2 G IJ SOLR
INTRAMUSCULAR | Status: AC
  Filled 2018-02-25: qty 1.2

## 2018-02-25 MED ORDER — CEFAZOLIN SODIUM-DEXTROSE 2-4 GM/100ML-% IV SOLN
2.0000 g | INTRAVENOUS | Status: AC
  Administered 2018-02-25: 2 g via INTRAVENOUS
  Filled 2018-02-25: qty 100

## 2018-02-25 MED ORDER — ONDANSETRON HCL 4 MG PO TABS: 4.0000 mg | ORAL_TABLET | Freq: Four times a day (QID) | ORAL | Status: DC | PRN

## 2018-02-25 MED ORDER — VANCOMYCIN HCL 10 G IV SOLR
2000.0000 mg | Freq: Once | INTRAVENOUS | Status: AC
  Administered 2018-02-26: 2000 mg via INTRAVENOUS
  Filled 2018-02-25: qty 2000

## 2018-02-25 MED ORDER — MAGNESIUM CITRATE PO SOLN: 1.0000 | Freq: Once | ORAL | Status: DC | PRN

## 2018-02-25 MED ORDER — SODIUM CHLORIDE 0.9 % IV SOLN
INTRAVENOUS | Status: DC
  Administered 2018-02-25 – 2018-02-27 (×3): via INTRAVENOUS

## 2018-02-25 MED ORDER — ACETAMINOPHEN 500 MG PO TABS
1000.0000 mg | ORAL_TABLET | Freq: Four times a day (QID) | ORAL | Status: AC
  Administered 2018-02-25 – 2018-02-26 (×4): 1000 mg via ORAL
  Filled 2018-02-25 (×4): qty 2

## 2018-02-25 MED ORDER — SUCCINYLCHOLINE CHLORIDE 200 MG/10ML IV SOSY
PREFILLED_SYRINGE | INTRAVENOUS | Status: DC | PRN
  Administered 2018-02-25: 100 mg via INTRAVENOUS

## 2018-02-25 MED ORDER — FENTANYL CITRATE (PF) 250 MCG/5ML IJ SOLN
INTRAMUSCULAR | Status: AC
  Filled 2018-02-25: qty 5

## 2018-02-25 MED ORDER — DEXAMETHASONE SODIUM PHOSPHATE 10 MG/ML IJ SOLN
10.0000 mg | Freq: Once | INTRAMUSCULAR | Status: AC
  Administered 2018-02-26: 10 mg via INTRAVENOUS
  Filled 2018-02-25: qty 1

## 2018-02-25 MED ORDER — METOPROLOL SUCCINATE ER 50 MG PO TB24
50.0000 mg | ORAL_TABLET | Freq: Every evening | ORAL | Status: DC
  Administered 2018-02-25 – 2018-02-26 (×2): 50 mg via ORAL
  Filled 2018-02-25 (×3): qty 1

## 2018-02-25 MED ORDER — FENTANYL CITRATE (PF) 100 MCG/2ML IJ SOLN
INTRAMUSCULAR | Status: DC | PRN
  Administered 2018-02-25 (×7): 50 ug via INTRAVENOUS

## 2018-02-25 MED ORDER — BISACODYL 10 MG RE SUPP: 10.0000 mg | Freq: Every day | RECTAL | Status: DC | PRN

## 2018-02-25 MED ORDER — METOCLOPRAMIDE HCL 5 MG PO TABS: 5.0000 mg | ORAL_TABLET | Freq: Three times a day (TID) | ORAL | Status: DC | PRN

## 2018-02-25 MED ORDER — FENTANYL CITRATE (PF) 100 MCG/2ML IJ SOLN
INTRAMUSCULAR | Status: AC
  Filled 2018-02-25: qty 2

## 2018-02-25 MED ORDER — DIPHENHYDRAMINE HCL 12.5 MG/5ML PO ELIX: 12.5000 mg | ORAL_SOLUTION | ORAL | Status: DC | PRN

## 2018-02-25 MED ORDER — SIMVASTATIN 20 MG PO TABS
20.0000 mg | ORAL_TABLET | Freq: Every evening | ORAL | Status: DC
  Administered 2018-02-26: 20 mg via ORAL
  Filled 2018-02-25 (×2): qty 1

## 2018-02-25 MED ORDER — ONDANSETRON HCL 4 MG/2ML IJ SOLN: 4.0000 mg | Freq: Four times a day (QID) | INTRAMUSCULAR | Status: DC | PRN

## 2018-02-25 MED ORDER — MIDAZOLAM HCL 5 MG/5ML IJ SOLN
INTRAMUSCULAR | Status: DC | PRN
  Administered 2018-02-25: 2 mg via INTRAVENOUS

## 2018-02-25 MED ORDER — METOCLOPRAMIDE HCL 5 MG/ML IJ SOLN: 5.0000 mg | Freq: Three times a day (TID) | INTRAMUSCULAR | Status: DC | PRN

## 2018-02-25 MED ORDER — LACTATED RINGERS IV SOLN
INTRAVENOUS | Status: DC
  Administered 2018-02-25 (×3): via INTRAVENOUS

## 2018-02-25 MED ORDER — HYDROMORPHONE HCL 1 MG/ML IJ SOLN: 0.5000 mg | INTRAMUSCULAR | Status: DC | PRN

## 2018-02-25 MED ORDER — CHLORHEXIDINE GLUCONATE 4 % EX LIQD: 60.0000 mL | Freq: Once | CUTANEOUS | Status: DC

## 2018-02-25 MED ORDER — VANCOMYCIN HCL 1000 MG IV SOLR
INTRAVENOUS | Status: AC
  Filled 2018-02-25: qty 2000

## 2018-02-25 MED ORDER — CEFAZOLIN SODIUM-DEXTROSE 2-4 GM/100ML-% IV SOLN
2.0000 g | Freq: Three times a day (TID) | INTRAVENOUS | Status: DC
  Administered 2018-02-25 – 2018-02-27 (×6): 2 g via INTRAVENOUS
  Filled 2018-02-25 (×6): qty 100

## 2018-02-25 MED ORDER — FENTANYL CITRATE (PF) 100 MCG/2ML IJ SOLN
25.0000 ug | INTRAMUSCULAR | Status: DC | PRN
  Administered 2018-02-25 (×2): 50 ug via INTRAVENOUS

## 2018-02-25 MED ORDER — TRANEXAMIC ACID-NACL 1000-0.7 MG/100ML-% IV SOLN
1000.0000 mg | INTRAVENOUS | Status: AC
  Administered 2018-02-25: 1000 mg via INTRAVENOUS
  Filled 2018-02-25: qty 100

## 2018-02-25 MED ORDER — SUGAMMADEX SODIUM 200 MG/2ML IV SOLN
INTRAVENOUS | Status: DC | PRN
  Administered 2018-02-25: 200 mg via INTRAVENOUS

## 2018-02-25 MED ORDER — FENTANYL CITRATE (PF) 100 MCG/2ML IJ SOLN
INTRAMUSCULAR | Status: AC
  Administered 2018-02-25: 50 ug via INTRAVENOUS
  Filled 2018-02-25: qty 2

## 2018-02-25 MED ORDER — HYDROGEN PEROXIDE 3 % EX SOLN
CUTANEOUS | Status: DC | PRN
  Administered 2018-02-25: 1 via TOPICAL

## 2018-02-25 MED ORDER — CEFAZOLIN SODIUM-DEXTROSE 2-4 GM/100ML-% IV SOLN
INTRAVENOUS | Status: AC
  Filled 2018-02-25: qty 100

## 2018-02-25 MED ORDER — OXYCODONE HCL 5 MG PO TABS
10.0000 mg | ORAL_TABLET | ORAL | Status: DC | PRN
  Administered 2018-02-25 – 2018-02-26 (×2): 10 mg via ORAL
  Filled 2018-02-25 (×2): qty 2

## 2018-02-25 MED ORDER — HYDROGEN PEROXIDE 3 % EX SOLN
CUTANEOUS | Status: AC
  Filled 2018-02-25: qty 473

## 2018-02-25 MED ORDER — STERILE WATER FOR IRRIGATION IR SOLN
Status: DC | PRN
  Administered 2018-02-25: 2000 mL

## 2018-02-25 MED ORDER — DEXAMETHASONE SODIUM PHOSPHATE 10 MG/ML IJ SOLN
10.0000 mg | Freq: Once | INTRAMUSCULAR | Status: AC
  Administered 2018-02-25: 10 mg via INTRAVENOUS

## 2018-02-25 MED ORDER — MIDAZOLAM HCL 2 MG/2ML IJ SOLN
INTRAMUSCULAR | Status: AC
  Filled 2018-02-25: qty 2

## 2018-02-25 MED ORDER — MENTHOL 3 MG MT LOZG: 1.0000 | LOZENGE | OROMUCOSAL | Status: DC | PRN

## 2018-02-25 MED ORDER — EPHEDRINE SULFATE-NACL 50-0.9 MG/10ML-% IV SOSY
PREFILLED_SYRINGE | INTRAVENOUS | Status: DC | PRN
  Administered 2018-02-25: 10 mg via INTRAVENOUS
  Administered 2018-02-25: 5 mg via INTRAVENOUS
  Administered 2018-02-25 (×2): 10 mg via INTRAVENOUS

## 2018-02-25 SURGICAL SUPPLY — 57 items
BAG SPEC THK2 15X12 ZIP CLS (MISCELLANEOUS) ×1
BAG ZIPLOCK 12X15 (MISCELLANEOUS) ×2 IMPLANT
BLADE SAW SGTL 18X1.27X75 (BLADE) ×2 IMPLANT
BRUSH FEMORAL CANAL (MISCELLANEOUS) ×2 IMPLANT
CEMENT HV SMART SET (Cement) ×2 IMPLANT
COVER SURGICAL LIGHT HANDLE (MISCELLANEOUS) ×2 IMPLANT
COVER WAND RF STERILE (DRAPES) IMPLANT
DRAPE ORTHO SPLIT 77X108 STRL (DRAPES) ×4
DRAPE POUCH INSTRU U-SHP 10X18 (DRAPES) ×2 IMPLANT
DRAPE SURG 17X11 SM STRL (DRAPES) ×2 IMPLANT
DRAPE SURG ORHT 6 SPLT 77X108 (DRAPES) ×2 IMPLANT
DRAPE U-SHAPE 47X51 STRL (DRAPES) ×2 IMPLANT
DRESSING AQUACEL AG SP 3.5X10 (GAUZE/BANDAGES/DRESSINGS) IMPLANT
DRSG AQUACEL AG ADV 3.5X14 (GAUZE/BANDAGES/DRESSINGS) IMPLANT
DRSG AQUACEL AG SP 3.5X10 (GAUZE/BANDAGES/DRESSINGS) ×2
DURAPREP 26ML APPLICATOR (WOUND CARE) ×2 IMPLANT
ELECT BLADE TIP CTD 4 INCH (ELECTRODE) ×2 IMPLANT
ELECT REM PT RETURN 15FT ADLT (MISCELLANEOUS) ×2 IMPLANT
FACESHIELD WRAPAROUND (MASK) ×8 IMPLANT
FACESHIELD WRAPAROUND OR TEAM (MASK) ×4 IMPLANT
GLOVE BIO SURGEON STRL SZ 6.5 (GLOVE) ×4 IMPLANT
GLOVE BIOGEL PI IND STRL 6.5 (GLOVE) IMPLANT
GLOVE BIOGEL PI IND STRL 7.5 (GLOVE) ×1 IMPLANT
GLOVE BIOGEL PI IND STRL 8.5 (GLOVE) ×1 IMPLANT
GLOVE BIOGEL PI INDICATOR 6.5 (GLOVE) ×1
GLOVE BIOGEL PI INDICATOR 7.5 (GLOVE) ×1
GLOVE BIOGEL PI INDICATOR 8.5 (GLOVE) ×1
GLOVE ECLIPSE 8.0 STRL XLNG CF (GLOVE) ×4 IMPLANT
GLOVE ORTHO TXT STRL SZ7.5 (GLOVE) ×4 IMPLANT
GLOVE SURG SS PI 6.5 STRL IVOR (GLOVE) ×4 IMPLANT
GOWN STRL REUS W/TWL 2XL LVL3 (GOWN DISPOSABLE) ×2 IMPLANT
GOWN STRL REUS W/TWL LRG LVL3 (GOWN DISPOSABLE) ×2 IMPLANT
HANDPIECE INTERPULSE COAX TIP (DISPOSABLE) ×2
HEAD FEM STD 32X+5 STRL (Hips) ×1 IMPLANT
KIT BASIN OR (CUSTOM PROCEDURE TRAY) ×2 IMPLANT
LINER ACET CUP 42MMX32MM (Hips) ×1 IMPLANT
MANIFOLD NEPTUNE II (INSTRUMENTS) ×2 IMPLANT
NS IRRIG 1000ML POUR BTL (IV SOLUTION) ×4 IMPLANT
PACK TOTAL JOINT (CUSTOM PROCEDURE TRAY) ×2 IMPLANT
PROTECTOR NERVE ULNAR (MISCELLANEOUS) ×2 IMPLANT
SET HNDPC FAN SPRY TIP SCT (DISPOSABLE) ×1 IMPLANT
SOL PREP POV-IOD 16OZ 10% (MISCELLANEOUS) ×1 IMPLANT
SPONGE LAP 18X18 RF (DISPOSABLE) ×3 IMPLANT
STAPLER VISISTAT 35W (STAPLE) ×2 IMPLANT
SUCTION FRAZIER HANDLE 12FR (TUBING) ×1
SUCTION TUBE FRAZIER 12FR DISP (TUBING) ×1 IMPLANT
SUT MNCRL AB 3-0 PS2 18 (SUTURE) ×1 IMPLANT
SUT STRATAFIX 0 PDS 27 VIOLET (SUTURE) ×4
SUT VIC AB 1 CT1 36 (SUTURE) ×4 IMPLANT
SUT VIC AB 2-0 CT1 27 (SUTURE) ×6
SUT VIC AB 2-0 CT1 TAPERPNT 27 (SUTURE) ×3 IMPLANT
SUTURE STRATFX 0 PDS 27 VIOLET (SUTURE) ×2 IMPLANT
TOWEL OR 17X26 10 PK STRL BLUE (TOWEL DISPOSABLE) ×4 IMPLANT
TOWEL OR NON WOVEN STRL DISP B (DISPOSABLE) ×2 IMPLANT
TRAY FOLEY MTR SLVR 16FR STAT (SET/KITS/TRAYS/PACK) ×2 IMPLANT
WATER STERILE IRR 1000ML POUR (IV SOLUTION) ×4 IMPLANT
YANKAUER SUCT BULB TIP 10FT TU (MISCELLANEOUS) IMPLANT

## 2018-02-25 NOTE — Progress Notes (Signed)
Arrived to speak with patient and family about PICC placement including risks, benefits, and alternatives. Patient still waiting for sandwich post surgery. Patient requested placement for 1-14. Primary RN Pam notified.

## 2018-02-25 NOTE — Anesthesia Procedure Notes (Signed)
Procedure Name: Intubation Date/Time: 02/25/2018 3:02 PM Performed by: West Pugh, CRNA Pre-anesthesia Checklist: Patient identified, Emergency Drugs available, Suction available, Patient being monitored and Timeout performed Patient Re-evaluated:Patient Re-evaluated prior to induction Oxygen Delivery Method: Circle system utilized Preoxygenation: Pre-oxygenation with 100% oxygen Induction Type: IV induction Ventilation: Mask ventilation without difficulty Laryngoscope Size: Mac and 4 Grade View: Grade I Tube type: Oral Tube size: 7.5 mm Number of attempts: 1 Airway Equipment and Method: Stylet Placement Confirmation: ETT inserted through vocal cords under direct vision,  positive ETCO2,  CO2 detector and breath sounds checked- equal and bilateral Secured at: 22 cm Tube secured with: Tape Dental Injury: Teeth and Oropharynx as per pre-operative assessment

## 2018-02-25 NOTE — Anesthesia Preprocedure Evaluation (Addendum)
Anesthesia Evaluation  Patient identified by MRN, date of birth, ID band Patient awake    Reviewed: Allergy & Precautions, NPO status , Patient's Chart, lab work & pertinent test results  Airway Mallampati: II  TM Distance: >3 FB     Dental   Pulmonary former smoker,    breath sounds clear to auscultation       Cardiovascular hypertension,  Rhythm:Regular Rate:Normal     Neuro/Psych    GI/Hepatic Neg liver ROS,   Endo/Other    Renal/GU Renal disease     Musculoskeletal  (+) Arthritis ,   Abdominal   Peds  Hematology   Anesthesia Other Findings   Reproductive/Obstetrics                            Anesthesia Physical Anesthesia Plan  ASA: III  Anesthesia Plan: General   Post-op Pain Management:    Induction: Intravenous  PONV Risk Score and Plan: 2 and Ondansetron, Dexamethasone, Midazolam and Treatment may vary due to age or medical condition  Airway Management Planned: Oral ETT  Additional Equipment:   Intra-op Plan:   Post-operative Plan: Possible Post-op intubation/ventilation  Informed Consent: I have reviewed the patients History and Physical, chart, labs and discussed the procedure including the risks, benefits and alternatives for the proposed anesthesia with the patient or authorized representative who has indicated his/her understanding and acceptance.   Dental advisory given  Plan Discussed with: Anesthesiologist and CRNA  Anesthesia Plan Comments:         Anesthesia Quick Evaluation

## 2018-02-25 NOTE — Progress Notes (Signed)
Pharmacy Antibiotic Note  Jon Choi is a 68 y.o. 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  He is s/p right hip debridement & replacement of antibiotic spacer today.  Pharmacy has been consulted for Ancef & Vancomycin dosing. 02/25/2018:  Afebrile  No leukocytosis  Renal function at patient's baseline  Plan: Ancef 2gm IV q8h Vancomycin 1gm IV q12h (target AUC 400-500) Monitor renal function and cx data   Height: 6\' 1"  (185.4 cm) Weight: 193 lb (87.5 kg) IBW/kg (Calculated) : 79.9  Temp (24hrs), Avg:97.8 F (36.6 C), Min:97.6 F (36.4 C), Max:98 F (36.7 C)  No results for input(s): WBC, CREATININE, LATICACIDVEN, VANCOTROUGH, VANCOPEAK, VANCORANDOM, GENTTROUGH, GENTPEAK, GENTRANDOM, TOBRATROUGH, TOBRAPEAK, TOBRARND, AMIKACINPEAK, AMIKACINTROU, AMIKACIN in the last 168 hours.  Estimated Creatinine Clearance: 78.5 mL/min (by C-G formula based on SCr of 1.06 mg/dL).    No Known Allergies  Antimicrobials this admission: 1/13 Vanc >>  1/13 Ancef >>   Dose adjustments this admission:  Microbiology results: 1/13 Surgical wound cx: 1/6 MRSA PCR: negative  Thank you for allowing pharmacy to be a part of this patient's care.  Biagio Borg 02/25/2018 6:56 PM

## 2018-02-25 NOTE — Transfer of Care (Signed)
Immediate Anesthesia Transfer of Care Note  Patient: Jon Choi  Procedure(s) Performed: Excisional and nonexcisional debridement left hip, revision cemented antibiotic acetabulum. (Right Hip)  Patient Location: PACU  Anesthesia Type:General  Level of Consciousness: awake, alert , oriented and patient cooperative  Airway & Oxygen Therapy: Patient Spontanous Breathing and Patient connected to face mask oxygen  Post-op Assessment: Report given to RN, Post -op Vital signs reviewed and stable and Patient moving all extremities  Post vital signs: Reviewed and stable  Last Vitals:  Vitals Value Taken Time  BP    Temp    Pulse 73 02/25/2018  5:35 PM  Resp    SpO2 100 % 02/25/2018  5:35 PM  Vitals shown include unvalidated device data.  Last Pain:  Vitals:   02/25/18 1314  TempSrc: Oral      Patients Stated Pain Goal: 4 (67/12/45 8099)  Complications: No apparent anesthesia complications

## 2018-02-25 NOTE — Interval H&P Note (Signed)
History and Physical Interval Note:  02/25/2018 1:20 PM  Jon Choi  has presented today for surgery, with the diagnosis of Status post right total hip arthroplasty and placement of antibiotic spacer  The various methods of treatment have been discussed with the patient and family. After consideration of risks, benefits and other options for treatment, the patient has consented to  Procedure(s) with comments: Revision/reimplantation right total hip with removal of antibiotic spacer (Right) - 120 mins as a surgical intervention .  The patient's history has been reviewed, patient examined, no change in status, stable for surgery.  I have reviewed the patient's chart and labs.  Questions were answered to the patient's satisfaction.     Mauri Pole

## 2018-02-25 NOTE — Op Note (Signed)
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  POSTOPERATIVE DIAGNOSIS:  Recurrent infection, right hip.  PROCEDURE:   1.  Repeat excisional and non-excisional debridement of right hip.  The excisional debridement included an incision of about 8 inches including the skin, subcutaneous tissue, bursal tissue, synovium and scared pseudo-capsule of the right hip as well as bone. 2.  Nonexcisional debridement of right hip wound with 6 liters normal saline solution separated by a Betadine and peroxide bath kept in the joint for 5-10 minutes followed by revision acetabulum with an antibiotic cemented acetabular liner utilizing 2  grams of vancomycin and 2.4 of tobramycin.  We utilized the Prostalac 32 mm cemented acetabular liner and a 32+5 Articul/Eze metal ball.  SURGEON:  [name], MD  ASSISTANT:  Danae Orleans, PA-C.  ANESTHESIA:  General.  BLOOD LOSS:  About 200 mL.  DRAINS:  None.  COMPLICATIONS:  None other than findings.  SPECIMENS:  Joint fluid from his right hip joint was sent as culture from swabs as well as fluid sent for Gram stain, aerobic, anaerobic evaluation.  INDICATIONS:  The patient is a pleasant 68 year old male whom I was reintroduced to about a year and a half ago at this point when he developed an infection of his right hip joint.  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  At the time of evaluation, he  had a line placed in his abscess in the pelvis.  At that time, I I and D'd his hip,  resected his acetabulum and chose to retain his femoral component based on the duration it had been in and the ineffective joint space concept.  We placed a cemented acetabular liner and ball.  He was treated with antibiotics.  Once this was eventually  solved, he had gone on to improve his overall quality of life and function and most recently had lumbar spine surgery.  He was followed in the office and was doing better with the plan to convert this to a total hip arthroplasty.  CT scan had been  performed, which revealed the abscess has been cleared from his pelvis.  No further workup seemed to be necessary as he clinically was doing fine.  Risks and benefits were discussed for converting this to a total hip arthroplasty.  At the time of our  discussion, I was not anticipating any potential recurrence of infection based on his clinical presentation and return.  Consent was obtained for converting to total hip arthroplasty.  DESCRIPTION OF PROCEDURE:  The patient was brought to the operative theater.  Once adequate anesthesia, preoperative antibiotics, which included Ancef, he was positioned in the left lateral decubitus position with right hip up.  The right lower extremity  was then prepped and draped in sterile fashion.  A timeout was performed identifying the patient, the planned procedure and extremity.  His old incision was identified and a portion of it utilized to excise the skin and subcutaneous tissue down to the  iliotibial band and gluteal fascia.  This was then  incised.  As I went through this layer and into the pseudocapsule of the posterior hip, we encountered a murky cloudy fluid.  This changed the direction of the procedure.  Culture swabs were opened and  swabbed as well as fluid aspirated to send to cath pathology for further evaluation.  At this point, I made a decision that he was not consented to have  his femoral component and resected.  Instead, I did an extensive debridement excisionally of the  capsule, pseudocapsule, synovium in the posterior and anterior aspect of the joint, removed the cemented acetabulum.  I then reamed the acetabulum.  Following this excisional debridement, I did a nonexcisional debridement first with 3 liters normal  saline solution.  Following this, I combined a mixture of Betadine and peroxide and placed this in the wound and let it sit for 10 minutes.  Once this had set for that period of time, we removed this fluid and rewashed the hip with 2.5 liters of normal  saline solution.  While this was being done, we opened up the cement and 2 grams of vancomycin and 2.4 of tobramycin.  Once the acetabulum was dried and prepared, the cement was mixed.  A new Prostalac 32 mm acetabular liner was opened and this was then  cemented placed into the acetabulum and held in position with a reduced hip using a 28 mm ball.  Once the cement had cured and excess cement was removed around the rim of the acetabulum, I dislocated the hip and placed a new 32+5 Articul/Eze metal ball  matching what we had done before.  This was reduced into the hip with an audible reduction.  We rewashed the hip with the remaining 500 mL of normal saline solution.  I then reapproximated the iliotibial band and gluteal fascia using #1 Vicryl and a  running Stratafix suture.  The remainder of the wound was closed with 2-0 Vicryl and a running Monocryl stitch.  Wound was clean, dry and dressed sterilely using surgical glue and Aquacel dressing.  Given these findings, the plan at this point is to treat him with IV antibiotics for six weeks.  I will ask for infectious disease consult tomorrow.  Findings were reviewed with family and will be reviewed with him in the morning.  He will be  weightbearing as tolerated.  From here, we will need to work with a plan, but at least for the next six months, he will be treated to  try to cure infection prior to consideration.  I will obtain C-reactive protein and sedimentation rate values for  baseline to follow.  Cultures will be followed.  TN/NUANCE  D:02/25/2018 T:02/25/2018 JOB:[id number]

## 2018-02-25 NOTE — Anesthesia Postprocedure Evaluation (Signed)
Anesthesia Post Note  Patient: Jon Choi  Procedure(s) Performed: Excisional and nonexcisional debridement left hip, revision cemented antibiotic acetabulum. (Right Hip)     Patient location during evaluation: PACU Anesthesia Type: General Level of consciousness: awake Pain management: pain level controlled Vital Signs Assessment: post-procedure vital signs reviewed and stable Respiratory status: spontaneous breathing Cardiovascular status: stable Postop Assessment: no apparent nausea or vomiting    Last Vitals:  Vitals:   02/25/18 1314 02/25/18 1732  BP: 125/86 (!) 126/91  Pulse: 72 79  Resp:  16  Temp: 36.7 C 36.4 C  SpO2: 100% 100%    Last Pain:  Vitals:   02/25/18 1732  TempSrc:   PainSc: (P) 5         RLE Motor Response: (P) Purposeful movement (02/25/18 1732) RLE Sensation: (P) Full sensation (02/25/18 1732)      [name]

## 2018-02-25 NOTE — Brief Op Note (Signed)
02/25/2018  3:12 PM  PATIENT:  Jon Choi  68 y.o. male  PRE-OPERATIVE DIAGNOSIS:  Status post resection native right hip and placement of antibiotic spacer  POST-OPERATIVE DIAGNOSIS:  Recurrent infection Right hip  PROCEDURE:  Procedure(s) with comments: Repeat excisional and non-excisional debridement right hip with revision antibiotic cemented acetabulum and new metal head ball.  Betadine and peroxide bath  SURGEON:  Surgeon(s) and Role:    * [name], MD - Primary  PHYSICIAN ASSISTANT: Danae Orleans, PA-C  ANESTHESIA:   general  EBL:  <200cc  BLOOD ADMINISTERED:none  DRAINS: none   LOCAL MEDICATIONS USED:  NONE  SPECIMEN:  No Specimen and Source of Specimen:  right hip synovial fluid  DISPOSITION OF SPECIMEN:  PATHOLOGY  COUNTS:  YES  TOURNIQUET:  * No tourniquets in log *  DICTATION: .Other Dictation: Dictation Number [id number]  PLAN OF CARE: Admit to inpatient   PATIENT DISPOSITION:  PACU - hemodynamically stable.   Delay start of Pharmacological VTE agent (>24hrs) due to surgical blood loss or risk of bleeding: no

## 2018-02-26 ENCOUNTER — Encounter (HOSPITAL_COMMUNITY): Payer: Self-pay

## 2018-02-26 DIAGNOSIS — M48061 Spinal stenosis, lumbar region without neurogenic claudication: Secondary | ICD-10-CM

## 2018-02-26 DIAGNOSIS — Z8739 Personal history of other diseases of the musculoskeletal system and connective tissue: Secondary | ICD-10-CM

## 2018-02-26 DIAGNOSIS — Z87891 Personal history of nicotine dependence: Secondary | ICD-10-CM

## 2018-02-26 DIAGNOSIS — T8451XD Infection and inflammatory reaction due to internal right hip prosthesis, subsequent encounter: Secondary | ICD-10-CM

## 2018-02-26 DIAGNOSIS — Z981 Arthrodesis status: Secondary | ICD-10-CM

## 2018-02-26 DIAGNOSIS — Z8619 Personal history of other infectious and parasitic diseases: Secondary | ICD-10-CM

## 2018-02-26 LAB — BASIC METABOLIC PANEL
Anion gap: 8 (ref 5–15)
BUN: 14 mg/dL (ref 8–23)
CO2: 23 mmol/L (ref 22–32)
Calcium: 8.1 mg/dL — ABNORMAL LOW (ref 8.9–10.3)
Chloride: 107 mmol/L (ref 98–111)
Creatinine, Ser: 0.99 mg/dL (ref 0.61–1.24)
GFR calc Af Amer: >60 mL/min (ref >60)
GFR calc non Af Amer: >60 mL/min (ref >60)
Glucose, Bld: 133 mg/dL — ABNORMAL HIGH (ref 70–99)
Potassium: 4.5 mmol/L (ref 3.5–5.1)
Sodium: 138 mmol/L (ref 135–145)

## 2018-02-26 LAB — CBC
HCT: 37.2 % — ABNORMAL LOW (ref 39.0–52.0)
Hemoglobin: 12 g/dL — ABNORMAL LOW (ref 13.0–17.0)
MCH: 32.3 pg (ref 26.0–34.0)
MCHC: 32.3 g/dL (ref 30.0–36.0)
MCV: 100 fL (ref 80.0–100.0)
Platelets: 236 10*3/uL (ref 150–400)
RBC: 3.72 MIL/uL — ABNORMAL LOW (ref 4.22–5.81)
RDW: 12.1 % (ref 11.5–15.5)
WBC: 9.5 10*3/uL (ref 4.0–10.5)
nRBC: 0 % (ref 0.0–0.2)

## 2018-02-26 LAB — SEDIMENTATION RATE: Sed Rate: 6 mm/hr (ref 0–16)

## 2018-02-26 LAB — C-REACTIVE PROTEIN: CRP: <0.8 mg/dL (ref <1.0)

## 2018-02-26 MED ORDER — SODIUM CHLORIDE 0.9% FLUSH
10.0000 mL | INTRAVENOUS | Status: DC | PRN
  Administered 2018-02-27: 20 mL
  Filled 2018-02-26: qty 40

## 2018-02-26 MED ORDER — NICOTINE 14 MG/24HR TD PT24
14.0000 mg | MEDICATED_PATCH | Freq: Every day | TRANSDERMAL | Status: DC
  Administered 2018-02-26 – 2018-02-27 (×2): 14 mg via TRANSDERMAL
  Filled 2018-02-26 (×2): qty 1

## 2018-02-26 NOTE — Addendum Note (Signed)
Addendum  created 02/26/18 0658 by Lollie Sails, CRNA   Charge Capture section accepted

## 2018-02-26 NOTE — Progress Notes (Signed)
Peripherally Inserted Central Catheter/Midline Placement  The IV Nurse has discussed with the patient and/or persons authorized to consent for the patient, the purpose of this procedure and the potential benefits and risks involved with this procedure.  The benefits include less needle sticks, lab draws from the catheter, and the patient may be discharged home with the catheter. Risks include, but not limited to, infection, bleeding, blood clot (thrombus formation), and puncture of an artery; nerve damage and irregular heartbeat and possibility to perform a PICC exchange if needed/ordered by physician.  Alternatives to this procedure were also discussed.  Bard Power PICC patient education guide, fact sheet on infection prevention and patient information card has been provided to patient /or left at bedside.    PICC/Midline Placement Documentation        Jon Choi 02/26/2018, 6:27 PM

## 2018-02-26 NOTE — Evaluation (Signed)
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  Patient will benefit from skilled PT to increase their independence and safety with mobility to allow discharge to the venue listed below.  Pt reports being familiar with posterior hip precautions from previous surgeries however required a few cues to maintain them during mobility.  Pt educated on PWB status.  Pt tolerated good distance of ambulation in hallway using RW.  Spouse present and can assist pt at home.       Follow Up Recommendations No PT follow up;Follow surgeon's recommendation for DC plan and follow-up therapies    Equipment Recommendations  Other (comment)(pt requesting shower seat (has tub shower))    Recommendations for Other Services       Precautions / Restrictions Precautions Precautions: Fall;Posterior Hip Restrictions Weight Bearing Restrictions: Yes RLE Weight Bearing: Partial weight bearing RLE Partial Weight Bearing Percentage or Pounds: 50%      Mobility  Bed Mobility Overal bed mobility: Needs Assistance Bed Mobility: Supine to Sit     Supine to sit: Supervision     General bed mobility comments: supervision for safety   Transfers Overall transfer level: Needs assistance Equipment used: Rolling walker (2 wheeled) Transfers: Sit to/from Stand Sit to Stand: Min assist         General transfer comment: verbal cues for technique and maintaining precautions, reviewed PWB status  Ambulation/Gait Ambulation/Gait assistance: Min guard Gait Distance  (Feet): 200 Feet Assistive device: Rolling walker (2 wheeled) Gait Pattern/deviations: Step-to pattern;Antalgic     General Gait Details: verbal cues for sequence, posture, use of RW to maintain PWB status; decreased heel strike and heel contact observed with R stance phase  Stairs            Wheelchair Mobility    Modified Rankin (Stroke Patients Only)       Balance                                             Pertinent Vitals/Pain Pain Assessment: 0-10 Pain Score: 3  Pain Location: R hip Pain Descriptors / Indicators: Tender;Sore Pain Intervention(s): Monitored during session;Repositioned;Premedicated before session;Limited activity within patient's tolerance;Ice applied    Home Living Family/patient expects to be discharged to:: Private residence Living Arrangements: Spouse/significant other Available Help at Discharge: Family Type of Home: House Home Access: Stairs to enter Entrance Stairs-Rails: Right Entrance Stairs-Number of Steps: 3 Home Layout: One level Home Equipment: Environmental consultant - 2 wheels;Bedside commode      Prior Function Level of Independence: Independent               Hand Dominance        Extremity/Trunk Assessment        Lower Extremity Assessment Lower Extremity Assessment: RLE deficits/detail RLE Deficits / Details: anticipated post op hip weakness, maintained precautions       Communication   Communication: No difficulties  Cognition Arousal/Alertness: Awake/alert Behavior During Therapy: WFL for tasks assessed/performed Overall Cognitive  Status: Within Functional Limits for tasks assessed                                        General Comments      Exercises     Assessment/Plan    PT Assessment Patient needs continued PT services  PT Problem List Decreased strength;Decreased mobility;Decreased activity tolerance;Decreased knowledge of precautions       PT Treatment  Interventions Gait training;Therapeutic activities;Functional mobility training;Balance training;DME instruction;Patient/family education;Stair training;Therapeutic exercise    PT Goals (Current goals can be found in the Care Plan section)  Acute Rehab PT Goals PT Goal Formulation: With patient Time For Goal Achievement: 03/02/18 Potential to Achieve Goals: Good    Frequency 7X/week   Barriers to discharge        Co-evaluation               AM-PAC PT "6 Clicks" Mobility  Outcome Measure Help needed turning from your back to your side while in a flat bed without using bedrails?: A Little Help needed moving from lying on your back to sitting on the side of a flat bed without using bedrails?: A Little Help needed moving to and from a bed to a chair (including a wheelchair)?: A Little Help needed standing up from a chair using your arms (e.g., wheelchair or bedside chair)?: A Little Help needed to walk in hospital room?: A Little Help needed climbing 3-5 steps with a railing? : A Little 6 Click Score: 18    End of Session Equipment Utilized During Treatment: Gait belt Activity Tolerance: Patient tolerated treatment well Patient left: in chair;with call bell/phone within reach;with chair alarm set;with family/visitor present   PT Visit Diagnosis: Other abnormalities of gait and mobility (R26.89)    Time: 0034-9179 PT Time Calculation (min) (ACUTE ONLY): 20 min   Charges:   PT Evaluation $PT Eval Low Complexity: Carroll, PT, DPT Acute Rehabilitation Services Office: [phone] Pager: [phone]  Trena Platt 02/26/2018, 11:23 AM

## 2018-02-26 NOTE — Consult Note (Signed)
[facility] for Infectious Disease  Total days of antibiotics 2        Day2 vanco/cefazolin       Reason for Consult:late pji of right tha   Referring Physician: olin  Active Problems:   Infected prosthesis of right hip [facility])    HPI: Jon Choi is a 68 y.o. 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 He underwent excision and nonexcisional debridement of right hip with removal of right acetabular component and removal of right femoral head and placement of articulating abtx spacer on 09/12/2016 by dr Alvan Dame, was treated with 6 wk of IV abtx and 4-5 months of oral abtx and followed through by my partner dr comer through October 2018 then had his lumbar surgery in Jan 2019. Has been doing well throughout the year with the plan to revise/reimplantation right THA and removal of abtx spacer on this admit. When dr Alvan Dame performed surgery, he unexpectedly saw murky appearing synovial fluid concerning for indolent infection. Thus, instead of having a new reimplantation of THA, the patient had repeat excisional and non-excisional debridement of right hip on 1/13 with intent to retreat. Cultures are pending -- with intent of doing 2 staged revision.  Patient denies any new fever, chills, nightsweats. Just noticing gradual hip discomfort    Past Medical History:  Diagnosis Date  . Arthritis   . Back pain   . History of kidney stones   . Hypertension   . Insomnia   . Melanoma ([place]) 2010   facial--multiple excision with plastic surgery     Allergies: No Known Allergies  MEDICATIONS: . aspirin  81 mg Oral BID  . celecoxib  200 mg Oral BID  . dexamethasone  10 mg Intravenous Once  . docusate sodium  100 mg Oral BID  . ferrous sulfate  325 mg Oral TID PC  .  metoprolol succinate  50 mg Oral QPM  . nicotine  14 mg Transdermal Daily  . polyethylene glycol  17 g Oral BID  . simvastatin  20 mg Oral QPM  . temazepam  30 mg Oral QHS    Social History   Tobacco Use  . Smoking status: Former Smoker    Types: E-cigarettes  . Smokeless tobacco: Never Used  . Tobacco comment: quit 2015  Substance Use Topics  . Alcohol use: Yes    Comment: occ  . Drug use: No    Family History  Problem Relation Age of Onset  . Hypertension Other     Review of Systems  Constitutional: Negative for fever, chills, diaphoresis, activity change, appetite change, fatigue and unexpected weight change.  HENT: Negative for congestion, sore throat, rhinorrhea, sneezing, trouble swallowing and sinus pressure.  Eyes: Negative for photophobia and visual disturbance.  Respiratory: Negative for cough, chest tightness, shortness of breath, wheezing and stridor.  Cardiovascular: Negative for chest pain, palpitations and leg swelling.  Gastrointestinal: Negative for nausea, vomiting, abdominal pain, diarrhea, constipation, blood in stool, abdominal distention and anal bleeding.  Genitourinary: Negative for dysuria, hematuria, flank pain and difficulty urinating.  Musculoskeletal: +right hip pain. Negative for myalgias, back pain, joint swelling, arthralgias and gait problem.  Skin: Negative for color change, pallor, rash and wound.  Neurological: Negative for dizziness, tremors, weakness and light-headedness.  Hematological: Negative for adenopathy. Does not bruise/bleed easily.  Psychiatric/Behavioral: Negative for behavioral problems, confusion, sleep disturbance, dysphoric mood, decreased concentration and agitation.     OBJECTIVE: Temp:  [97.6 F (36.4 C)-97.7 F (36.5 C)] 97.6 F (36.4 C) (01/14 1047) Pulse Rate:  [49-97] 73 (01/14 1406) Resp:  [12-17] 16 (01/14 1406) BP: (96-152)/(61-91) 107/61 (01/14 1406) SpO2:  [95 %-100 %] 97 % (01/14 1406) Physical Exam    Constitutional: He is oriented to person, place, and time. He appears well-developed and well-nourished. No distress.  HENT:  Mouth/Throat: Oropharynx is clear and moist. No oropharyngeal exudate.  Cardiovascular: Normal rate, regular rhythm and normal heart sounds. Exam reveals no gallop and no friction rub.  No murmur heard.  Pulmonary/Chest: Effort normal and breath sounds normal. No respiratory distress. He has no wheezes.  Abdominal: Soft. Bowel sounds are normal. He exhibits no distension. There is no tenderness.  Lymphadenopathy:  He has no cervical adenopathy.  GGY:IRSWN hip incision, bandaged no erythema, induration, and tenderness to touch Neurological: He is alert and oriented to person, place, and time.  Skin: Skin is warm and dry. No rash noted. No erythema.  Psychiatric: He has a normal mood and affect. His behavior is normal.     LABS: Results for orders placed or performed during the hospital encounter of 02/25/18 (from the past 48 hour(s))  Aerobic/Anaerobic Culture (surgical/deep wound)     Status: None (Preliminary result)   Collection Time: 02/25/18  3:15 PM  Result Value Ref Range   Specimen Description      SYNOVIAL Performed at [facility] [address], [place], [place] [address]    Special Requests      RIGHT HIP Performed at [facility] [address] [place], [place] [address]    Gram Stain      RARE WBC PRESENT,BOTH PMN AND MONONUCLEAR NO ORGANISMS SEEN    Culture      NO GROWTH < 24 HOURS Performed at [facility] [address] [place], [place] [address]    Report Status PENDING   Sedimentation rate     Status: None   Collection Time: 02/26/18  5:11 AM  Result Value Ref Range   Sed Rate 6 0 - 16 mm/hr    Comment: Performed at [facility], [place] [address], [place], [place] [address]  C-reactive protein     Status: None   Collection Time: 02/26/18  5:11 AM  Result Value  Ref Range   CRP <0.8 <1.0 mg/dL    Comment: Performed at [facility], [place] [address], [place], [place] [address]  CBC     Status: Abnormal   Collection Time: 02/26/18  5:11 AM  Result Value Ref Range   WBC 9.5 4.0 - 10.5 K/uL   RBC 3.72 (L) 4.22 - 5.81 MIL/uL   Hemoglobin 12.0 (L) 13.0 - 17.0 g/dL   HCT 37.2 (L) 39.0 - 52.0 %   MCV 100.0 80.0 - 100.0 fL   MCH 32.3 26.0 - 34.0 pg   MCHC 32.3 30.0 - 36.0 g/dL   RDW 12.1 11.5 - 15.5 %   Platelets 236 150 - 400 K/uL   nRBC 0.0 0.0 - 0.2 %    Comment: Performed at [facility], [place] [address] [place], [place] [address]  Basic metabolic panel     Status: Abnormal   Collection Time: 02/26/18  5:11 AM  Result Value Ref Range  Sodium 138 135 - 145 mmol/L   Potassium 4.5 3.5 - 5.1 mmol/L   Chloride 107 98 - 111 mmol/L   CO2 23 22 - 32 mmol/L   Glucose, Bld 133 (H) 70 - 99 mg/dL   BUN 14 8 - 23 mg/dL   Creatinine, Ser 0.99 0.61 - 1.24 mg/dL   Calcium 8.1 (L) 8.9 - 10.3 mg/dL   GFR calc non Af Amer >60 >60 mL/min   GFR calc Af Amer >60 >60 mL/min   Anion gap 8 5 - 15    Comment: Performed at [facility], [place] [address], [place], [place] [address]    MICRO: pending IMAGING: [place] Ekg Site Rite  Result Date: 02/25/2018 If Site Rite image not attached, placement could not be confirmed due to current cardiac rhythm.   Assessment/Plan:  67yoM with presumed infected THA with retained abtx spacer s/p debridement on 1/13. Had previous hx of MSSA in 2018 - appears to be indolent given appearance, clinical history, and inflammatory markers are WNL possibly strep, actinomyces or propi. species  - continue on vancomycin and cefazolin for the time being - place picc line - await culture results to see if can narrow abtx - if culture negative, would do 6 wk of vancomycin plus ceftriaxone 2gm IV daily - will ask micro lab to hold specimen longer

## 2018-02-26 NOTE — Progress Notes (Signed)
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  Lunch arrived and pt to possibly have PICC placed today.    Follow Up Recommendations  No PT follow up;Follow surgeon's recommendation for DC plan and follow-up therapies     Equipment Recommendations  Other (comment)(pt requesting shower chair (has tub shower))    Recommendations for Other Services       Precautions / Restrictions Precautions Precautions: Fall;Posterior Hip Restrictions Weight Bearing Restrictions: Yes RLE Weight Bearing: Partial weight bearing RLE Partial Weight Bearing Percentage or Pounds: 50%    Mobility  Bed Mobility Overal bed mobility: Needs Assistance Bed Mobility: Supine to Sit;Sit to Supine     Supine to sit: Supervision Sit to supine: Supervision   General bed mobility comments: supervision for safety   Transfers Overall transfer level: Needs assistance Equipment used: Rolling walker (2 wheeled) Transfers: Sit to/from Stand Sit to Stand: Min guard         General transfer comment: verbal cues for technique and maintaining precautions, cues for PWB status  Ambulation/Gait Ambulation/Gait assistance: Min guard Gait Distance (Feet): 200 Feet Assistive device: Rolling walker (2 wheeled) Gait Pattern/deviations: Step-to pattern;Antalgic     General Gait Details: verbal cues for sequence, posture, use of RW to maintain PWB status; decreased heel strike and heel contact observed with R stance phase   Stairs             Wheelchair Mobility    Modified Rankin  (Stroke Patients Only)       Balance                                            Cognition Arousal/Alertness: Awake/alert Behavior During Therapy: WFL for tasks assessed/performed Overall Cognitive Status: Within Functional Limits for tasks assessed                                        Exercises      General Comments        Pertinent Vitals/Pain Pain Assessment: 0-10 Pain Score: 3  Pain Location: R hip Pain Descriptors / Indicators: Tender;Sore Pain Intervention(s): Monitored during session;Repositioned;Limited activity within patient's tolerance    Home Living Family/patient expects to be discharged to:: Private residence Living Arrangements: Spouse/significant other Available Help at Discharge: Family Type of Home: House Home Access: Stairs to enter Entrance Stairs-Rails: Right Home Layout: One level Home Equipment: Environmental consultant - 2 wheels;Bedside commode      Prior Function Level of Independence: Independent          PT Goals (current goals can now be found in the care plan section) Acute Rehab PT Goals PT Goal Formulation: With patient Time For Goal Achievement: 03/02/18 Potential to Achieve Goals: Good Progress towards PT goals: Progressing toward goals    Frequency    7X/week      PT Plan Current plan remains appropriate  Co-evaluation              AM-PAC PT "6 Clicks" Mobility   Outcome Measure  Help needed turning from your back to your side while in a flat bed without using bedrails?: A Little Help needed moving from lying on your back to sitting on the side of a flat bed without using bedrails?: A Little Help needed moving to and from a bed to a chair (including a wheelchair)?: A Little Help needed standing up from a chair using your arms (e.g., wheelchair or bedside chair)?: A Little Help needed to walk in hospital room?: A Little Help needed climbing 3-5 steps with a railing? : A Little 6 Click  Score: 18    End of Session Equipment Utilized During Treatment: Gait belt Activity Tolerance: Patient tolerated treatment well Patient left: with call bell/phone within reach;with family/visitor present;in bed   PT Visit Diagnosis: Other abnormalities of gait and mobility (R26.89)     Time: 2707-8675 PT Time Calculation (min) (ACUTE ONLY): 10 min  Charges:  $Gait Training: 8-22 mins                     Carmelia Bake, PT, DPT Acute Rehabilitation Services Office: [phone] Pager: [phone]  Trena Platt 02/26/2018, 1:49 PM

## 2018-02-26 NOTE — Progress Notes (Signed)
Patient ID: Jon Choi, male   DOB: [birth year], 68 y.o.   MRN: [id number] Subjective: 1 Day Post-Op Procedure(s) (LRB): Excisional and nonexcisional debridement left hip, revision cemented antibiotic acetabulum. (Right)    Patient reports pain as mild to moderate, no real complaints this am. Reviewed with him the intra-op findings and subsequent plans  Objective:   VITALS:   Vitals:   02/26/18 0131 02/26/18 0521  BP: 96/62 101/66  Pulse: (!) 58 (!) 49  Resp: 17 16  Temp: 97.6 F (36.4 C) 97.6 F (36.4 C)  SpO2: 98% 99%    Neurovascular intact Incision: dressing C/D/I  LABS Recent Labs    02/26/18 0511  HGB 12.0*  HCT 37.2*  WBC 9.5  PLT 236    Recent Labs    02/26/18 0511  NA 138  K 4.5  BUN 14  CREATININE 0.99  GLUCOSE 133*    No results for input(s): LABPT, INR in the last 72 hours.   Assessment/Plan: 1 Day Post-Op Procedure(s) (LRB): Excisional and nonexcisional debridement left hip, revision cemented antibiotic acetabulum. (Right)   Advance diet Up with therapy  Consult ID for OR findings PIC line ordered WBAT RLE D/C pending plan development

## 2018-02-27 DIAGNOSIS — Z22321 Carrier or suspected carrier of Methicillin susceptible Staphylococcus aureus: Secondary | ICD-10-CM

## 2018-02-27 DIAGNOSIS — Z95828 Presence of other vascular implants and grafts: Secondary | ICD-10-CM

## 2018-02-27 LAB — CBC
HCT: 33.2 % — ABNORMAL LOW (ref 39.0–52.0)
HEMOGLOBIN: 10.7 g/dL — AB (ref 13.0–17.0)
MCH: 32.5 pg (ref 26.0–34.0)
MCHC: 32.2 g/dL (ref 30.0–36.0)
MCV: 100.9 fL — ABNORMAL HIGH (ref 80.0–100.0)
Platelets: 196 10*3/uL (ref 150–400)
RBC: 3.29 MIL/uL — ABNORMAL LOW (ref 4.22–5.81)
RDW: 12.3 % (ref 11.5–15.5)
WBC: 9 10*3/uL (ref 4.0–10.5)
nRBC: 0 % (ref 0.0–0.2)

## 2018-02-27 LAB — BASIC METABOLIC PANEL
ANION GAP: 8 (ref 5–15)
BUN: 15 mg/dL (ref 8–23)
CHLORIDE: 109 mmol/L (ref 98–111)
CO2: 23 mmol/L (ref 22–32)
Calcium: 8.2 mg/dL — ABNORMAL LOW (ref 8.9–10.3)
Creatinine, Ser: 0.94 mg/dL (ref 0.61–1.24)
GFR calc Af Amer: >60 mL/min (ref >60)
GFR calc non Af Amer: >60 mL/min (ref >60)
Glucose, Bld: 158 mg/dL — ABNORMAL HIGH (ref 70–99)
Potassium: 4 mmol/L (ref 3.5–5.1)
Sodium: 140 mmol/L (ref 135–145)

## 2018-02-27 MED ORDER — HEPARIN SOD (PORK) LOCK FLUSH 100 UNIT/ML IV SOLN
250.0000 [IU] | INTRAVENOUS | Status: DC | PRN
  Administered 2018-02-27: 250 [IU]

## 2018-02-27 MED ORDER — VANCOMYCIN IV (FOR PTA / DISCHARGE USE ONLY): 1000.0000 mg | Freq: Two times a day (BID) | INTRAVENOUS | 0 refills | Status: AC

## 2018-02-27 MED ORDER — FERROUS SULFATE 325 (65 FE) MG PO TABS: 325.0000 mg | ORAL_TABLET | Freq: Three times a day (TID) | ORAL | 3 refills | Status: DC

## 2018-02-27 MED ORDER — OXYCODONE HCL 5 MG PO TABS: 5.0000 mg | ORAL_TABLET | ORAL | 0 refills | Status: DC | PRN

## 2018-02-27 MED ORDER — ASPIRIN 81 MG PO CHEW: 81.0000 mg | CHEWABLE_TABLET | Freq: Two times a day (BID) | ORAL | 0 refills | Status: AC

## 2018-02-27 MED ORDER — DOCUSATE SODIUM 100 MG PO CAPS: 100.0000 mg | ORAL_CAPSULE | Freq: Two times a day (BID) | ORAL | 0 refills | Status: DC

## 2018-02-27 MED ORDER — CEFTRIAXONE IV (FOR PTA / DISCHARGE USE ONLY): 2.0000 g | INTRAVENOUS | 0 refills | Status: AC

## 2018-02-27 MED ORDER — ACETAMINOPHEN 500 MG PO TABS: 1000.0000 mg | ORAL_TABLET | Freq: Three times a day (TID) | ORAL | 0 refills | Status: DC

## 2018-02-27 MED ORDER — METHOCARBAMOL 500 MG PO TABS: 500.0000 mg | ORAL_TABLET | Freq: Four times a day (QID) | ORAL | 0 refills | Status: DC | PRN

## 2018-02-27 MED ORDER — POLYETHYLENE GLYCOL 3350 17 G PO PACK: 17.0000 g | PACK | Freq: Two times a day (BID) | ORAL | 0 refills | Status: DC

## 2018-02-27 MED ORDER — SODIUM CHLORIDE 0.9 % IV SOLN
2.0000 g | INTRAVENOUS | Status: DC
  Administered 2018-02-27: 2 g via INTRAVENOUS
  Filled 2018-02-27: qty 2
  Filled 2018-02-27: qty 20

## 2018-02-27 NOTE — Care Management Note (Signed)
Case Management Note  Patient Details  Name: DERREON CONSALVO MRN: [id number] Date of Birth: 05/07/1950  Subjective/Objective:    Discharge planning, spoke with patient at beside. Chose [facility] for [facility] services, RN for IV abx.   Action/Plan: Contacted [facility] for referral. They have accepted. [phone]               Expected Discharge Date:                  Expected Discharge Plan:  [place]  In-House Referral:  NA  Discharge planning Services  CM Consult  Post Acute Care Choice:  Home Health Choice offered to:  Patient  DME Arranged:  N/A DME Agency:  NA  HH Arranged:  RN, Disease Management [place] Agency:  [facility]  Status of Service:  Completed, signed off  If discussed at [place] of Stay Meetings, dates discussed:    Additional Comments:  Guadalupe Maple, RN 02/27/2018, 4:23 PM

## 2018-02-27 NOTE — Progress Notes (Signed)
PHARMACY CONSULT NOTE FOR:  OUTPATIENT  PARENTERAL ANTIBIOTIC THERAPY (OPAT)  Indication: Prosthetic Joint Infection Regimen: Vancomycin 1gm q12 & Ceftriaxone 2gm q24 End date: 04/10/2018  IV antibiotic discharge orders are pended. To discharging provider:  please sign these orders via discharge navigator,  Select New Orders & click on the button choice - Manage This Unsigned Work.     Thank you for allowing pharmacy to be a part of this patient's care.  Nyoka Cowden, Avelyn Touch L 02/27/2018, 3:00 PM

## 2018-02-27 NOTE — Progress Notes (Signed)
[facility] Infusion Coordinator will follow pt with ID team to support home IVABX at DC. [facility] will partner with patients [facility] agency of choice for [facility] services.  If patient discharges after hours, please call [phone].   Larry Sierras 02/27/2018, 7:59 AM

## 2018-02-27 NOTE — Progress Notes (Signed)
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  Pt ambulated well in hallway and practiced safe stair technique.  Pt reports being familiar with restrictions, precautions and exercises since last hip surgery.  Pt encouraged to only perform gentle AROM exercises and let pain be a guide.  Pt eager for d/c home asap.   Follow Up Recommendations  No PT follow up;Follow surgeon's recommendation for DC plan and follow-up therapies     Equipment Recommendations  Other (comment)(pt requesting shower chair for tub shower)    Recommendations for Other Services       Precautions / Restrictions Precautions Precautions: Fall;Posterior Hip Restrictions Weight Bearing Restrictions: Yes RLE Weight Bearing: Partial weight bearing RLE Partial Weight Bearing Percentage or Pounds: 50%    Mobility  Bed Mobility Overal bed mobility: Needs Assistance Bed Mobility: Supine to Sit     Supine to sit: Supervision     General bed mobility comments: supervision for cues for THP  Transfers Overall transfer level: Needs assistance Equipment used: Rolling walker (2 wheeled) Transfers: Sit to/from Stand Sit to Stand: Supervision         General transfer comment: verbal cues for technique and maintaining precautions, cues for PWB status  Ambulation/Gait Ambulation/Gait assistance: Min guard;Supervision Gait Distance (Feet): 240 Feet Assistive device: Rolling walker (2 wheeled) Gait Pattern/deviations: Antalgic;Step-through pattern     General Gait Details: verbal cues for  sequence, posture, use of RW to maintain PWB status; decreased heel strike    Stairs Stairs: Yes Stairs assistance: Min guard Stair Management: Step to pattern;Forwards;Two rails Number of Stairs: 3 General stair comments: verbal cues for PWB, sequence, safety   Wheelchair Mobility    Modified Rankin (Stroke Patients Only)       Balance                                            Cognition Arousal/Alertness: Awake/alert Behavior During Therapy: WFL for tasks assessed/performed Overall Cognitive Status: Within Functional Limits for tasks assessed                                        Exercises      General Comments        Pertinent Vitals/Pain Pain Assessment: 0-10 Pain Score: 2  Pain Location: R hip Pain Descriptors / Indicators: Tender;Sore Pain Intervention(s): Monitored during session    Home Living                      Prior Function            PT Goals (current goals can now be found in the care plan section) Progress towards PT goals: Progressing toward goals    Frequency    7X/week      PT Plan Current plan remains appropriate    Co-evaluation  AM-PAC PT "6 Clicks" Mobility   Outcome Measure  Help needed turning from your back to your side while in a flat bed without using bedrails?: A Little Help needed moving from lying on your back to sitting on the side of a flat bed without using bedrails?: A Little Help needed moving to and from a bed to a chair (including a wheelchair)?: A Little Help needed standing up from a chair using your arms (e.g., wheelchair or bedside chair)?: A Little Help needed to walk in hospital room?: A Little Help needed climbing 3-5 steps with a railing? : A Little 6 Click Score: 18    End of Session Equipment Utilized During Treatment: Gait belt Activity Tolerance: Patient tolerated treatment well Patient left: with call bell/phone within  reach;Other (comment)(standing at sink to brush teeth, recliner behind pt to sit when finished, call bell in reach)   PT Visit Diagnosis: Other abnormalities of gait and mobility (R26.89)     Time: 8833-7445 PT Time Calculation (min) (ACUTE ONLY): 18 min  Charges:  $Gait Training: 8-22 mins                     Carmelia Bake, PT, DPT Acute Rehabilitation Services Office: [phone] Pager: [phone]  Trena Platt 02/27/2018, 12:54 PM

## 2018-02-27 NOTE — Progress Notes (Signed)
Pharmacy Antibiotic Note  KEBRON PULSE is a 68 y.o. 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  He is s/p right hip debridement & replacement of antibiotic spacer today.  Pharmacy has been consulted for Ancef & Vancomycin dosing. 02/27/2018:  Afebrile  No leukocytosis  Renal function at patient's baseline  Plan: Per ID: Discontinue Ancef Begin Ceftriaxone 2gm q24 Vancomycin 1gm IV q12h (target AUC 400-500) Plan 6 weeks IV abx, followed by po abx  Obtain peak and trough between 4th and 5th doses, not yet at steady state, but can guide therapy  Height: 6\' 1"  (185.4 cm) Weight: 193 lb (87.5 kg) IBW/kg (Calculated) : 79.9  Temp (24hrs), Avg:97.4 F (36.3 C), Min:97.4 F (36.3 C), Max:97.4 F (36.3 C)  Recent Labs  Lab 02/26/18 0511 02/27/18 0458  WBC 9.5 9.0  CREATININE 0.99 0.94    Estimated Creatinine Clearance: 88.5 mL/min (by C-G formula based on SCr of 0.94 mg/dL).    No Known Allergies  Antimicrobials this admission: 1/13 Vanc >>  1/13 Ancef >> 1/15 1/15 Ceftriaxone >>  Dose adjustments this admission:  Microbiology results: 1/13 Surgical wound cx: no organisms seen, ngtd 1/6 MRSA PCR: negative  Thank you for allowing pharmacy to be a part of this patient's care.  Minda Ditto 02/27/2018 2:47 PM

## 2018-02-27 NOTE — Plan of Care (Signed)
Patient discharged home in stable condition. PICC line intact, was seen by IV nurse before discharge

## 2018-02-27 NOTE — Progress Notes (Signed)
     Subjective: 2 Days Post-Op Procedure(s) (LRB): Excisional and nonexcisional debridement left hip, revision cemented antibiotic acetabulum. (Right)   Patient reports pain as mild, controlled. No events throughout the night. States that he is really looking forward to getting home. I have discussed that we need some recs from ID prior to d/c.  Patient states he understands.  We have discussed the procedure and the placement of another antibiotic spacer.  He states that he is disappointed that he didn't get a real hip, but that he understands for the potential for infection.     Objective:   VITALS:   Vitals:   02/26/18 2150 02/27/18 0627  BP: 135/75 128/71  Pulse: 77 62  Resp: 14 17  Temp: (!) 97.4 F (36.3 C) (!) 97.4 F (36.3 C)  SpO2: 96% 98%    Dorsiflexion/Plantar flexion intact Incision: dressing C/D/I No cellulitis present Compartment soft  LABS Recent Labs    02/26/18 0511 02/27/18 0458  HGB 12.0* 10.7*  HCT 37.2* 33.2*  WBC 9.5 9.0  PLT 236 196    Recent Labs    02/26/18 0511 02/27/18 0458  NA 138 140  K 4.5 4.0  BUN 14 15  CREATININE 0.99 0.94  GLUCOSE 133* 158*     Assessment/Plan: 2 Days Post-Op Procedure(s) (LRB): Excisional and nonexcisional debridement left hip, revision cemented antibiotic acetabulum. (Right) Appreciate ID and the assistance with this patient Up with therapy Discharge home with home health when ready Follow up in 2 weeks at [facility] Follow up with OLIN,Narda Fundora D in 2 weeks.  Contact information:  [facility]) [address] [place]   PAC  02/27/2018, 9:51 AM

## 2018-02-27 NOTE — Progress Notes (Addendum)
ID PROGRESS NOTE   24Hr: afebrile,looking forward to going home. Had picc line placed last night without difficulty OR cultures are NGTD at 48 hrs  ROS: no diarrhea, fever, chills, minimal pain to right hip  . aspirin  81 mg Oral BID  . celecoxib  200 mg Oral BID  . docusate sodium  100 mg Oral BID  . ferrous sulfate  325 mg Oral TID PC  . metoprolol succinate  50 mg Oral QPM  . nicotine  14 mg Transdermal Daily  . polyethylene glycol  17 g Oral BID  . simvastatin  20 mg Oral QPM  . temazepam  30 mg Oral QHS  BP 120/64 (BP Location: Left Arm)   Pulse 67   Temp 97.6 F (36.4 C) (Oral)   Resp 16   Ht _0  (1.854 m)   Wt 87.5 kg   SpO2 97%   BMI 25.46 kg/m  Physical Exam  Constitutional: He is oriented to person, place, and time. He appears well-developed and well-nourished. No distress.  HENT:  Mouth/Throat: Oropharynx is clear and moist. No oropharyngeal exudate.  Cardiovascular: Normal rate, regular rhythm and normal heart sounds. Exam reveals no gallop and no friction rub.  No murmur heard.  Pulmonary/Chest: Effort normal and breath sounds normal. No respiratory distress. He has no wheezes.  Abdominal: Soft. Bowel sounds are normal. He exhibits no distension. There is no tenderness.  Lymphadenopathy:  He has no cervical adenopathy.  Neurological: He is alert and oriented to person, place, and time.  Skin: Skin is warm and dry. No rash noted. No erythema.  GNF:AOZHY hip bandaged, and right arm picc line is c/d/i Psychiatric: He has a normal mood and affect. His behavior is normal.    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  Will recommend to change abtx to vancomycin and ceftriaxone 2gm IV Q day x 6 wk, then change to oral abtx thereafter vanco trough 15-20 Labs weekly picc line care per protocol  vanco dosing per protocol I have discussed info with home health coordinator and primary team And opat orders Will see back in the ID clinic in 4-6 wk  Will  call micro to hold plates for 2 wk  --------------------- Diagnosis: pji  Culture Result: culture negative  No Known Allergies  OPAT Orders Discharge antibiotics: Per pharmacy protocol  Vancomycin plus ceftriaxone Aim for Vancomycin trough 15-20 (unless otherwise indicated) Duration: 6 wk End Date: Feb 26th  [place] Per Protocol:  Labs weekly while on IV antibiotics: __x CBC with differential _x_ BMP __ CMP _x_ CRP _x_ ESR _x_ Vancomycin trough   _x_ Please pull PIC at completion of IV antibiotics   Fax weekly labs to [phone]  Clinic Follow Up Appt: 4-6 wk  @ [place] [address] [place] [phone]

## 2018-02-28 ENCOUNTER — Telehealth: Payer: Self-pay | Admitting: *Deleted

## 2018-02-28 DIAGNOSIS — A419 Sepsis, unspecified organism: Secondary | ICD-10-CM | POA: Diagnosis not present

## 2018-02-28 DIAGNOSIS — T8459XA Infection and inflammatory reaction due to other internal joint prosthesis, initial encounter: Secondary | ICD-10-CM | POA: Diagnosis not present

## 2018-02-28 DIAGNOSIS — Z22321 Carrier or suspected carrier of Methicillin susceptible Staphylococcus aureus: Secondary | ICD-10-CM | POA: Diagnosis not present

## 2018-02-28 DIAGNOSIS — T8451XA Infection and inflammatory reaction due to internal right hip prosthesis, initial encounter: Secondary | ICD-10-CM | POA: Diagnosis not present

## 2018-02-28 DIAGNOSIS — Z792 Long term (current) use of antibiotics: Secondary | ICD-10-CM | POA: Diagnosis not present

## 2018-02-28 DIAGNOSIS — A4901 Methicillin susceptible Staphylococcus aureus infection, unspecified site: Secondary | ICD-10-CM | POA: Diagnosis not present

## 2018-02-28 DIAGNOSIS — Z7982 Long term (current) use of aspirin: Secondary | ICD-10-CM | POA: Diagnosis not present

## 2018-02-28 DIAGNOSIS — Z5181 Encounter for therapeutic drug level monitoring: Secondary | ICD-10-CM | POA: Diagnosis not present

## 2018-02-28 DIAGNOSIS — Z452 Encounter for adjustment and management of vascular access device: Secondary | ICD-10-CM | POA: Diagnosis not present

## 2018-02-28 NOTE — Discharge Summary (Signed)
Physician Discharge Summary  Patient ID: Jon Choi MRN: [id number] DOB/AGE: 04/25/1950 68 y.o.  Admit date: 02/25/2018 Discharge date: 02/27/2018   Procedures:  Procedure(s) (LRB): Excisional and nonexcisional debridement left hip, revision cemented antibiotic acetabulum. (Right)  Attending Physician:  Dr. 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 Arthritis   . Back pain   . History of kidney stones   . Hypertension   . Insomnia   . Melanoma (Jon Choi) 2010   facial--multiple excision with plastic surgery     HPI:    Pt is a 68 y.o. male for a revision / reimplantation right THA with removal of antibiotic spacer.  He has had his back surgery and feel that he is at a point he can have the hip revised.  X-rays in the clinic show a spacer in proper position. Pt has tried various conservative treatments which have failed to alleviate their symptoms. Various options are discussed with the patient. Risks, benefits and expectations were discussed with the patient. Patient understand the risks, benefits and expectations and wishes to proceed with surgery.   PCP: [name], MD   Discharged Condition: good  Hospital Course:  Patient underwent the above stated procedure on 02/25/2018. Patient tolerated the procedure well and brought to the recovery room in good condition and subsequently to the floor.  POD #1 BP: 101/66 ; Pulse: 49 ; Temp: 97.6 F (36.4 C) ; Resp: 16 Patient reports pain as mild to moderate, no real complaints this am. Reviewed with him the intra-op findings and subsequent plans. Neurovascular intact and incision: dressing C/D/I.   LABS  Basename    HGB     12.0  HCT     37.2   POD #2  BP: 128/71 ; Pulse: 62 ; Temp: 97.4 F (36.3 C) ; Resp:  17 Patient reports pain as mild, controlled. No events throughout the night. States that he is really looking forward to getting home. I have discussed that we need some recs from ID prior to d/c.  Patient states he understands.  We have discussed the procedure and the placement of another antibiotic spacer.  He states that he is disappointed that he didn't get a real hip, but that he understands for the potential for infection.  Dorsiflexion/plantar flexion intact, incision: dressing C/D/I, no cellulitis present and compartment soft.   LABS  Basename    HGB     10.7  HCT     33.2    Discharge Exam: General appearance: alert, cooperative and no distress Extremities: Homans sign is negative, no sign of DVT, no edema, redness or tenderness in the calves or thighs and no ulcers, gangrene or trophic changes  Disposition:  Home with follow up in 2 weeks   Follow-up Information    [name], MD. Schedule an appointment as soon as possible for a visit in 2 weeks.   Specialty:  Orthopedic Surgery Contact information: [address] [place] [address] [phone]        [facility], [facility]-Home Follow up.   Specialty:  [place]:  nurse to assist with antibiotic Contact information: [address] [place] [address] [phone]           Discharge Instructions  Call MD / Call 911   Complete by:  As directed    If you experience chest pain or shortness of breath, CALL 911 and be transported to the hospital emergency room.  If you develope a fever above 101 F, pus (white drainage) or increased drainage or redness at the wound, or calf pain, call your surgeon's office.   Change dressing   Complete by:  As directed    Maintain surgical dressing until follow up in the clinic. If the edges start to pull up, may reinforce with tape. If the dressing is no longer working, may remove and cover with gauze and tape, but must keep  the area dry and clean.  Call with any questions or concerns.   Constipation Prevention   Complete by:  As directed    Drink plenty of fluids.  Prune juice may be helpful.  You may use a stool softener, such as Colace (over the counter) 100 mg twice a day.  Use MiraLax (over the counter) for constipation as needed.   Diet - low sodium heart healthy   Complete by:  As directed    Discharge instructions   Complete by:  As directed    Maintain surgical dressing until follow up in the clinic. If the edges start to pull up, may reinforce with tape. If the dressing is no longer working, may remove and cover with gauze and tape, but must keep the area dry and clean.  Follow up in 2 weeks at [facility]. Call with any questions or concerns.   Follow the hip precautions as taught in Physical Therapy   Complete by:  As directed    Home infusion instructions [facility] May follow [place] Dosing Protocol; May administer Cathflo as needed to maintain patency of vascular access device.; Flushing of vascular access device: per [facility] Protocol: 0.9% NaCl pre/post medica...   Complete by:  As directed    Instructions:  May follow [place] Dosing Protocol   Instructions:  May administer Cathflo as needed to maintain patency of vascular access device.   Instructions:  Flushing of vascular access device: per [facility] Protocol: 0.9% NaCl pre/post medication administration and prn patency; Heparin 100 u/ml, 76m for implanted ports and Heparin 10u/ml, 539mfor all other central venous catheters.   Instructions:  May follow [facility] Anaphylaxis Protocol for First Dose Administration in the home: 0.9% NaCl at 25-50 ml/hr to maintain IV access for protocol meds. Epinephrine 0.3 ml IV/IM PRN and Benadryl 25-50 IV/IM PRN s/s of anaphylaxis.   Instructions:  [facility] Coord[facility] (RN) to assist per patient IV care needs in the home PRN.   Increase activity slowly as tolerated   Complete by:  As  directed    Weight bearing as tolerated with assist device (walker, cane, etc) as directed, use it as long as suggested by your surgeon or therapist, typically at least 4-6 weeks.   TED hose   Complete by:  As directed    Use stockings (TED hose) for 2 weeks on both leg(s).  You may remove them at night for sleeping.      Allergies as of 02/27/2018   No Known Allergies     Medication List    STOP taking these medications   cyclobenzaprine 10 MG tablet Commonly known as:  FLEXERIL   metoCLOPramide 5 MG tablet Commonly known as:  REGLAN   naproxen sodium 220 MG tablet Commonly known as:  ALEVE   pantoprazole 40  MG tablet Commonly known as:  PROTONIX     TAKE these medications   acetaminophen 500 MG tablet Commonly known as:  TYLENOL Take 2 tablets (1,000 mg total) by mouth every 8 (eight) hours.   aspirin 81 MG chewable tablet Commonly known as:  ASPIRIN CHILDRENS Chew 1 tablet (81 mg total) by mouth 2 (two) times daily for 30 days. Take for 4 weeks, then resume regular dose.   cefTRIAXone  IVPB Commonly known as:  ROCEPHIN Inject 2 g into the vein daily for 42 doses. Indication:  Prosthetic Joing infection Last Day of Therapy:  04/10/2018 Labs - Once weekly:  CBC/D and BMP,  Labs - Every other week:  ESR and CRP   celecoxib 200 MG capsule Commonly known as:  CELEBREX Take 200 mg by mouth every evening.   docusate sodium 100 MG capsule Commonly known as:  COLACE Take 1 capsule (100 mg total) by mouth 2 (two) times daily.   ferrous sulfate 325 (65 FE) MG tablet Commonly known as:  FERROUSUL Take 1 tablet (325 mg total) by mouth 3 (three) times daily with meals. What changed:  when to take this   methocarbamol 500 MG tablet Commonly known as:  ROBAXIN Take 1 tablet (500 mg total) by mouth every 6 (six) hours as needed for muscle spasms.   metoprolol succinate 25 MG 24 hr tablet Commonly known as:  TOPROL-XL Take 50 mg by mouth every evening.   oxyCODONE 5  MG immediate release tablet Commonly known as:  Oxy IR/ROXICODONE Take 1-2 tablets (5-10 mg total) by mouth every 4 (four) hours as needed for moderate pain or severe pain. What changed:  Another medication with the same name was removed. Continue taking this medication, and follow the directions you see here.   polyethylene glycol packet Commonly known as:  MIRALAX / GLYCOLAX Take 17 g by mouth 2 (two) times daily.   simvastatin 20 MG tablet Commonly known as:  ZOCOR Take 20 mg by mouth every evening.   temazepam 30 MG capsule Commonly known as:  RESTORIL Take 30 mg by mouth at bedtime.   vancomycin  IVPB Inject 1,000 mg into the vein every 12 (twelve) hours for 84 doses. Indication:  Prosthetic Joint Infection Last Day of Therapy:  04/10/2018 Labs - Sunday/Monday:  CBC/D, BMP, and vancomycin trough. Labs - Thursday:  BMP and vancomycin trough Labs - Every other week:  ESR and CRP            Home Infusion Instuctions  (From admission, onward)         Start     Ordered   02/27/18 0000  Home infusion instructions [facility] May follow [place] Dosing Protocol; May administer Cathflo as needed to maintain patency of vascular access device.; Flushing of vascular access device: per [facility] Protocol: 0.9% NaCl pre/post medica...    Question Answer Comment  Instructions May follow [place] Dosing Protocol   Instructions May administer Cathflo as needed to maintain patency of vascular access device.   Instructions Flushing of vascular access device: per [facility] Protocol: 0.9% NaCl pre/post medication administration and prn patency; Heparin 100 u/ml, 42m for implanted ports and Heparin 10u/ml, 548mfor all other central venous catheters.   Instructions May follow [facility] Anaphylaxis Protocol for First Dose Administration in the home: 0.9% NaCl at 25-50 ml/hr to maintain IV access for protocol meds. Epinephrine 0.3 ml IV/IM PRN and Benadryl 25-50 IV/IM PRN s/s of anaphylaxis.     Instructions  [facility] Infusion Coordinator (RN) to assist per patient IV care needs in the home PRN.      02/27/18 1640           Discharge Care Instructions  (From admission, onward)         Start     Ordered   02/27/18 0000  Change dressing    Comments:  Maintain surgical dressing until follow up in the clinic. If the edges start to pull up, may reinforce with tape. If the dressing is no longer working, may remove and cover with gauze and tape, but must keep the area dry and clean.  Call with any questions or concerns.   02/27/18 1641           Signed: [place]. Lyra Alaimo   PA-C  02/28/2018, 8:35 AM

## 2018-02-28 NOTE — Telephone Encounter (Signed)
-----   Message from Carlyle Basques, MD sent at 02/27/2018  4:47 PM EST ----- Can I see back in 4-5 wk. He is on vanco and ceftriaxone for pji

## 2018-02-28 NOTE — Telephone Encounter (Signed)
Per Dr Cleda Clarks, Lanice Schwab, RN

## 2018-03-01 ENCOUNTER — Telehealth: Payer: Self-pay

## 2018-03-01 DIAGNOSIS — Z7982 Long term (current) use of aspirin: Secondary | ICD-10-CM | POA: Diagnosis not present

## 2018-03-01 DIAGNOSIS — Z22321 Carrier or suspected carrier of Methicillin susceptible Staphylococcus aureus: Secondary | ICD-10-CM | POA: Diagnosis not present

## 2018-03-01 DIAGNOSIS — Z792 Long term (current) use of antibiotics: Secondary | ICD-10-CM | POA: Diagnosis not present

## 2018-03-01 DIAGNOSIS — T8451XA Infection and inflammatory reaction due to internal right hip prosthesis, initial encounter: Secondary | ICD-10-CM | POA: Diagnosis not present

## 2018-03-01 DIAGNOSIS — Z5181 Encounter for therapeutic drug level monitoring: Secondary | ICD-10-CM | POA: Diagnosis not present

## 2018-03-01 DIAGNOSIS — Z452 Encounter for adjustment and management of vascular access device: Secondary | ICD-10-CM | POA: Diagnosis not present

## 2018-03-01 NOTE — Telephone Encounter (Signed)
Joyce Copa case manager left message on machine: pt complaing of chest tightness with infusion of vancomycin. She states he has reported this to home health nurse and had no return response.  She is calling to make sure Dr Baxter Flattery is aware.   I attempted to reach patient, no answer.  Left message to call our office.   Laverle Patter, RN

## 2018-03-02 LAB — AEROBIC/ANAEROBIC CULTURE (SURGICAL/DEEP WOUND)

## 2018-03-02 LAB — AEROBIC/ANAEROBIC CULTURE W GRAM STAIN (SURGICAL/DEEP WOUND): Culture: NO GROWTH

## 2018-03-05 DIAGNOSIS — Z22321 Carrier or suspected carrier of Methicillin susceptible Staphylococcus aureus: Secondary | ICD-10-CM | POA: Diagnosis not present

## 2018-03-05 DIAGNOSIS — Z792 Long term (current) use of antibiotics: Secondary | ICD-10-CM | POA: Diagnosis not present

## 2018-03-05 DIAGNOSIS — Z7982 Long term (current) use of aspirin: Secondary | ICD-10-CM | POA: Diagnosis not present

## 2018-03-05 DIAGNOSIS — Z5181 Encounter for therapeutic drug level monitoring: Secondary | ICD-10-CM | POA: Diagnosis not present

## 2018-03-05 DIAGNOSIS — Z452 Encounter for adjustment and management of vascular access device: Secondary | ICD-10-CM | POA: Diagnosis not present

## 2018-03-05 DIAGNOSIS — T8451XA Infection and inflammatory reaction due to internal right hip prosthesis, initial encounter: Secondary | ICD-10-CM | POA: Diagnosis not present

## 2018-03-06 DIAGNOSIS — Z5181 Encounter for therapeutic drug level monitoring: Secondary | ICD-10-CM | POA: Diagnosis not present

## 2018-03-06 DIAGNOSIS — Z792 Long term (current) use of antibiotics: Secondary | ICD-10-CM | POA: Diagnosis not present

## 2018-03-06 DIAGNOSIS — Z452 Encounter for adjustment and management of vascular access device: Secondary | ICD-10-CM | POA: Diagnosis not present

## 2018-03-06 DIAGNOSIS — Z22321 Carrier or suspected carrier of Methicillin susceptible Staphylococcus aureus: Secondary | ICD-10-CM | POA: Diagnosis not present

## 2018-03-06 DIAGNOSIS — Z7982 Long term (current) use of aspirin: Secondary | ICD-10-CM | POA: Diagnosis not present

## 2018-03-06 DIAGNOSIS — T8451XA Infection and inflammatory reaction due to internal right hip prosthesis, initial encounter: Secondary | ICD-10-CM | POA: Diagnosis not present

## 2018-03-07 DIAGNOSIS — A499 Bacterial infection, unspecified: Secondary | ICD-10-CM | POA: Diagnosis not present

## 2018-03-07 DIAGNOSIS — Z452 Encounter for adjustment and management of vascular access device: Secondary | ICD-10-CM | POA: Diagnosis not present

## 2018-03-07 DIAGNOSIS — Z5181 Encounter for therapeutic drug level monitoring: Secondary | ICD-10-CM | POA: Diagnosis not present

## 2018-03-07 DIAGNOSIS — Z22321 Carrier or suspected carrier of Methicillin susceptible Staphylococcus aureus: Secondary | ICD-10-CM | POA: Diagnosis not present

## 2018-03-07 DIAGNOSIS — Z7982 Long term (current) use of aspirin: Secondary | ICD-10-CM | POA: Diagnosis not present

## 2018-03-07 DIAGNOSIS — T8451XA Infection and inflammatory reaction due to internal right hip prosthesis, initial encounter: Secondary | ICD-10-CM | POA: Diagnosis not present

## 2018-03-07 DIAGNOSIS — Z792 Long term (current) use of antibiotics: Secondary | ICD-10-CM | POA: Diagnosis not present

## 2018-03-08 DIAGNOSIS — A4901 Methicillin susceptible Staphylococcus aureus infection, unspecified site: Secondary | ICD-10-CM | POA: Diagnosis not present

## 2018-03-08 DIAGNOSIS — T8459XA Infection and inflammatory reaction due to other internal joint prosthesis, initial encounter: Secondary | ICD-10-CM | POA: Diagnosis not present

## 2018-03-08 DIAGNOSIS — A419 Sepsis, unspecified organism: Secondary | ICD-10-CM | POA: Diagnosis not present

## 2018-03-09 DIAGNOSIS — T8459XA Infection and inflammatory reaction due to other internal joint prosthesis, initial encounter: Secondary | ICD-10-CM | POA: Diagnosis not present

## 2018-03-09 DIAGNOSIS — A4901 Methicillin susceptible Staphylococcus aureus infection, unspecified site: Secondary | ICD-10-CM | POA: Diagnosis not present

## 2018-03-09 DIAGNOSIS — A419 Sepsis, unspecified organism: Secondary | ICD-10-CM | POA: Diagnosis not present

## 2018-03-11 DIAGNOSIS — Z7982 Long term (current) use of aspirin: Secondary | ICD-10-CM | POA: Diagnosis not present

## 2018-03-11 DIAGNOSIS — T8451XA Infection and inflammatory reaction due to internal right hip prosthesis, initial encounter: Secondary | ICD-10-CM | POA: Diagnosis not present

## 2018-03-11 DIAGNOSIS — Z22321 Carrier or suspected carrier of Methicillin susceptible Staphylococcus aureus: Secondary | ICD-10-CM | POA: Diagnosis not present

## 2018-03-11 DIAGNOSIS — Z452 Encounter for adjustment and management of vascular access device: Secondary | ICD-10-CM | POA: Diagnosis not present

## 2018-03-11 DIAGNOSIS — Z792 Long term (current) use of antibiotics: Secondary | ICD-10-CM | POA: Diagnosis not present

## 2018-03-11 DIAGNOSIS — Z5181 Encounter for therapeutic drug level monitoring: Secondary | ICD-10-CM | POA: Diagnosis not present

## 2018-03-12 DIAGNOSIS — Z7982 Long term (current) use of aspirin: Secondary | ICD-10-CM | POA: Diagnosis not present

## 2018-03-12 DIAGNOSIS — Z5181 Encounter for therapeutic drug level monitoring: Secondary | ICD-10-CM | POA: Diagnosis not present

## 2018-03-12 DIAGNOSIS — T8451XA Infection and inflammatory reaction due to internal right hip prosthesis, initial encounter: Secondary | ICD-10-CM | POA: Diagnosis not present

## 2018-03-12 DIAGNOSIS — Z22321 Carrier or suspected carrier of Methicillin susceptible Staphylococcus aureus: Secondary | ICD-10-CM | POA: Diagnosis not present

## 2018-03-12 DIAGNOSIS — Z792 Long term (current) use of antibiotics: Secondary | ICD-10-CM | POA: Diagnosis not present

## 2018-03-12 DIAGNOSIS — Z452 Encounter for adjustment and management of vascular access device: Secondary | ICD-10-CM | POA: Diagnosis not present

## 2018-03-13 DIAGNOSIS — Z792 Long term (current) use of antibiotics: Secondary | ICD-10-CM | POA: Diagnosis not present

## 2018-03-13 DIAGNOSIS — Z7982 Long term (current) use of aspirin: Secondary | ICD-10-CM | POA: Diagnosis not present

## 2018-03-13 DIAGNOSIS — Z5181 Encounter for therapeutic drug level monitoring: Secondary | ICD-10-CM | POA: Diagnosis not present

## 2018-03-13 DIAGNOSIS — T8451XA Infection and inflammatory reaction due to internal right hip prosthesis, initial encounter: Secondary | ICD-10-CM | POA: Diagnosis not present

## 2018-03-13 DIAGNOSIS — Z22321 Carrier or suspected carrier of Methicillin susceptible Staphylococcus aureus: Secondary | ICD-10-CM | POA: Diagnosis not present

## 2018-03-13 DIAGNOSIS — Z452 Encounter for adjustment and management of vascular access device: Secondary | ICD-10-CM | POA: Diagnosis not present

## 2018-03-14 DIAGNOSIS — A419 Sepsis, unspecified organism: Secondary | ICD-10-CM | POA: Diagnosis not present

## 2018-03-14 DIAGNOSIS — A4901 Methicillin susceptible Staphylococcus aureus infection, unspecified site: Secondary | ICD-10-CM | POA: Diagnosis not present

## 2018-03-14 DIAGNOSIS — T8459XA Infection and inflammatory reaction due to other internal joint prosthesis, initial encounter: Secondary | ICD-10-CM | POA: Diagnosis not present

## 2018-03-15 DIAGNOSIS — Z22321 Carrier or suspected carrier of Methicillin susceptible Staphylococcus aureus: Secondary | ICD-10-CM | POA: Diagnosis not present

## 2018-03-15 DIAGNOSIS — T8451XA Infection and inflammatory reaction due to internal right hip prosthesis, initial encounter: Secondary | ICD-10-CM | POA: Diagnosis not present

## 2018-03-15 DIAGNOSIS — Z452 Encounter for adjustment and management of vascular access device: Secondary | ICD-10-CM | POA: Diagnosis not present

## 2018-03-15 DIAGNOSIS — Z792 Long term (current) use of antibiotics: Secondary | ICD-10-CM | POA: Diagnosis not present

## 2018-03-15 DIAGNOSIS — Z5181 Encounter for therapeutic drug level monitoring: Secondary | ICD-10-CM | POA: Diagnosis not present

## 2018-03-15 DIAGNOSIS — Z7982 Long term (current) use of aspirin: Secondary | ICD-10-CM | POA: Diagnosis not present

## 2018-03-18 DIAGNOSIS — Z7982 Long term (current) use of aspirin: Secondary | ICD-10-CM | POA: Diagnosis not present

## 2018-03-18 DIAGNOSIS — T8451XA Infection and inflammatory reaction due to internal right hip prosthesis, initial encounter: Secondary | ICD-10-CM | POA: Diagnosis not present

## 2018-03-18 DIAGNOSIS — Z792 Long term (current) use of antibiotics: Secondary | ICD-10-CM | POA: Diagnosis not present

## 2018-03-18 DIAGNOSIS — Z452 Encounter for adjustment and management of vascular access device: Secondary | ICD-10-CM | POA: Diagnosis not present

## 2018-03-18 DIAGNOSIS — Z22321 Carrier or suspected carrier of Methicillin susceptible Staphylococcus aureus: Secondary | ICD-10-CM | POA: Diagnosis not present

## 2018-03-18 DIAGNOSIS — Z5181 Encounter for therapeutic drug level monitoring: Secondary | ICD-10-CM | POA: Diagnosis not present

## 2018-03-19 DIAGNOSIS — T8451XA Infection and inflammatory reaction due to internal right hip prosthesis, initial encounter: Secondary | ICD-10-CM | POA: Diagnosis not present

## 2018-03-19 DIAGNOSIS — Z22321 Carrier or suspected carrier of Methicillin susceptible Staphylococcus aureus: Secondary | ICD-10-CM | POA: Diagnosis not present

## 2018-03-19 DIAGNOSIS — Z5181 Encounter for therapeutic drug level monitoring: Secondary | ICD-10-CM | POA: Diagnosis not present

## 2018-03-19 DIAGNOSIS — Z452 Encounter for adjustment and management of vascular access device: Secondary | ICD-10-CM | POA: Diagnosis not present

## 2018-03-19 DIAGNOSIS — Z792 Long term (current) use of antibiotics: Secondary | ICD-10-CM | POA: Diagnosis not present

## 2018-03-19 DIAGNOSIS — Z7982 Long term (current) use of aspirin: Secondary | ICD-10-CM | POA: Diagnosis not present

## 2018-03-21 DIAGNOSIS — Z452 Encounter for adjustment and management of vascular access device: Secondary | ICD-10-CM | POA: Diagnosis not present

## 2018-03-21 DIAGNOSIS — Z7982 Long term (current) use of aspirin: Secondary | ICD-10-CM | POA: Diagnosis not present

## 2018-03-21 DIAGNOSIS — Z22321 Carrier or suspected carrier of Methicillin susceptible Staphylococcus aureus: Secondary | ICD-10-CM | POA: Diagnosis not present

## 2018-03-21 DIAGNOSIS — T8451XA Infection and inflammatory reaction due to internal right hip prosthesis, initial encounter: Secondary | ICD-10-CM | POA: Diagnosis not present

## 2018-03-21 DIAGNOSIS — Z5181 Encounter for therapeutic drug level monitoring: Secondary | ICD-10-CM | POA: Diagnosis not present

## 2018-03-21 DIAGNOSIS — Z792 Long term (current) use of antibiotics: Secondary | ICD-10-CM | POA: Diagnosis not present

## 2018-03-22 DIAGNOSIS — A419 Sepsis, unspecified organism: Secondary | ICD-10-CM | POA: Diagnosis not present

## 2018-03-22 DIAGNOSIS — T8459XA Infection and inflammatory reaction due to other internal joint prosthesis, initial encounter: Secondary | ICD-10-CM | POA: Diagnosis not present

## 2018-03-22 DIAGNOSIS — A4901 Methicillin susceptible Staphylococcus aureus infection, unspecified site: Secondary | ICD-10-CM | POA: Diagnosis not present

## 2018-03-25 DIAGNOSIS — T8451XA Infection and inflammatory reaction due to internal right hip prosthesis, initial encounter: Secondary | ICD-10-CM | POA: Diagnosis not present

## 2018-03-25 DIAGNOSIS — Z792 Long term (current) use of antibiotics: Secondary | ICD-10-CM | POA: Diagnosis not present

## 2018-03-25 DIAGNOSIS — Z452 Encounter for adjustment and management of vascular access device: Secondary | ICD-10-CM | POA: Diagnosis not present

## 2018-03-25 DIAGNOSIS — Z7982 Long term (current) use of aspirin: Secondary | ICD-10-CM | POA: Diagnosis not present

## 2018-03-25 DIAGNOSIS — Z5181 Encounter for therapeutic drug level monitoring: Secondary | ICD-10-CM | POA: Diagnosis not present

## 2018-03-25 DIAGNOSIS — Z22321 Carrier or suspected carrier of Methicillin susceptible Staphylococcus aureus: Secondary | ICD-10-CM | POA: Diagnosis not present

## 2018-03-28 DIAGNOSIS — Z7982 Long term (current) use of aspirin: Secondary | ICD-10-CM | POA: Diagnosis not present

## 2018-03-28 DIAGNOSIS — Z452 Encounter for adjustment and management of vascular access device: Secondary | ICD-10-CM | POA: Diagnosis not present

## 2018-03-28 DIAGNOSIS — Z5181 Encounter for therapeutic drug level monitoring: Secondary | ICD-10-CM | POA: Diagnosis not present

## 2018-03-28 DIAGNOSIS — Z792 Long term (current) use of antibiotics: Secondary | ICD-10-CM | POA: Diagnosis not present

## 2018-03-28 DIAGNOSIS — Z22321 Carrier or suspected carrier of Methicillin susceptible Staphylococcus aureus: Secondary | ICD-10-CM | POA: Diagnosis not present

## 2018-03-28 DIAGNOSIS — T8451XA Infection and inflammatory reaction due to internal right hip prosthesis, initial encounter: Secondary | ICD-10-CM | POA: Diagnosis not present

## 2018-03-29 DIAGNOSIS — T8451XA Infection and inflammatory reaction due to internal right hip prosthesis, initial encounter: Secondary | ICD-10-CM | POA: Diagnosis not present

## 2018-03-29 DIAGNOSIS — Z7982 Long term (current) use of aspirin: Secondary | ICD-10-CM | POA: Diagnosis not present

## 2018-03-29 DIAGNOSIS — Z22321 Carrier or suspected carrier of Methicillin susceptible Staphylococcus aureus: Secondary | ICD-10-CM | POA: Diagnosis not present

## 2018-03-29 DIAGNOSIS — Z452 Encounter for adjustment and management of vascular access device: Secondary | ICD-10-CM | POA: Diagnosis not present

## 2018-03-29 DIAGNOSIS — Z792 Long term (current) use of antibiotics: Secondary | ICD-10-CM | POA: Diagnosis not present

## 2018-03-29 DIAGNOSIS — Z5181 Encounter for therapeutic drug level monitoring: Secondary | ICD-10-CM | POA: Diagnosis not present

## 2018-03-30 DIAGNOSIS — T8459XA Infection and inflammatory reaction due to other internal joint prosthesis, initial encounter: Secondary | ICD-10-CM | POA: Diagnosis not present

## 2018-03-30 DIAGNOSIS — A4901 Methicillin susceptible Staphylococcus aureus infection, unspecified site: Secondary | ICD-10-CM | POA: Diagnosis not present

## 2018-03-30 DIAGNOSIS — A419 Sepsis, unspecified organism: Secondary | ICD-10-CM | POA: Diagnosis not present

## 2018-04-01 DIAGNOSIS — T8451XA Infection and inflammatory reaction due to internal right hip prosthesis, initial encounter: Secondary | ICD-10-CM | POA: Diagnosis not present

## 2018-04-01 DIAGNOSIS — Z792 Long term (current) use of antibiotics: Secondary | ICD-10-CM | POA: Diagnosis not present

## 2018-04-01 DIAGNOSIS — Z22321 Carrier or suspected carrier of Methicillin susceptible Staphylococcus aureus: Secondary | ICD-10-CM | POA: Diagnosis not present

## 2018-04-01 DIAGNOSIS — Z7982 Long term (current) use of aspirin: Secondary | ICD-10-CM | POA: Diagnosis not present

## 2018-04-01 DIAGNOSIS — Z452 Encounter for adjustment and management of vascular access device: Secondary | ICD-10-CM | POA: Diagnosis not present

## 2018-04-01 DIAGNOSIS — Z5181 Encounter for therapeutic drug level monitoring: Secondary | ICD-10-CM | POA: Diagnosis not present

## 2018-04-04 DIAGNOSIS — Z5181 Encounter for therapeutic drug level monitoring: Secondary | ICD-10-CM | POA: Diagnosis not present

## 2018-04-04 DIAGNOSIS — Z22321 Carrier or suspected carrier of Methicillin susceptible Staphylococcus aureus: Secondary | ICD-10-CM | POA: Diagnosis not present

## 2018-04-04 DIAGNOSIS — T8451XA Infection and inflammatory reaction due to internal right hip prosthesis, initial encounter: Secondary | ICD-10-CM | POA: Diagnosis not present

## 2018-04-04 DIAGNOSIS — Z452 Encounter for adjustment and management of vascular access device: Secondary | ICD-10-CM | POA: Diagnosis not present

## 2018-04-04 DIAGNOSIS — Z7982 Long term (current) use of aspirin: Secondary | ICD-10-CM | POA: Diagnosis not present

## 2018-04-04 DIAGNOSIS — Z792 Long term (current) use of antibiotics: Secondary | ICD-10-CM | POA: Diagnosis not present

## 2018-04-05 DIAGNOSIS — Z4789 Encounter for other orthopedic aftercare: Secondary | ICD-10-CM | POA: Diagnosis not present

## 2018-04-06 DIAGNOSIS — A4901 Methicillin susceptible Staphylococcus aureus infection, unspecified site: Secondary | ICD-10-CM | POA: Diagnosis not present

## 2018-04-06 DIAGNOSIS — A419 Sepsis, unspecified organism: Secondary | ICD-10-CM | POA: Diagnosis not present

## 2018-04-06 DIAGNOSIS — T8459XA Infection and inflammatory reaction due to other internal joint prosthesis, initial encounter: Secondary | ICD-10-CM | POA: Diagnosis not present

## 2018-04-08 DIAGNOSIS — Z452 Encounter for adjustment and management of vascular access device: Secondary | ICD-10-CM | POA: Diagnosis not present

## 2018-04-08 DIAGNOSIS — T8451XA Infection and inflammatory reaction due to internal right hip prosthesis, initial encounter: Secondary | ICD-10-CM | POA: Diagnosis not present

## 2018-04-08 DIAGNOSIS — Z792 Long term (current) use of antibiotics: Secondary | ICD-10-CM | POA: Diagnosis not present

## 2018-04-08 DIAGNOSIS — Z7982 Long term (current) use of aspirin: Secondary | ICD-10-CM | POA: Diagnosis not present

## 2018-04-08 DIAGNOSIS — Z22321 Carrier or suspected carrier of Methicillin susceptible Staphylococcus aureus: Secondary | ICD-10-CM | POA: Diagnosis not present

## 2018-04-08 DIAGNOSIS — Z5181 Encounter for therapeutic drug level monitoring: Secondary | ICD-10-CM | POA: Diagnosis not present

## 2018-04-11 DIAGNOSIS — T8451XA Infection and inflammatory reaction due to internal right hip prosthesis, initial encounter: Secondary | ICD-10-CM | POA: Diagnosis not present

## 2018-04-11 DIAGNOSIS — Z5181 Encounter for therapeutic drug level monitoring: Secondary | ICD-10-CM | POA: Diagnosis not present

## 2018-04-11 DIAGNOSIS — Z22321 Carrier or suspected carrier of Methicillin susceptible Staphylococcus aureus: Secondary | ICD-10-CM | POA: Diagnosis not present

## 2018-04-11 DIAGNOSIS — Z7982 Long term (current) use of aspirin: Secondary | ICD-10-CM | POA: Diagnosis not present

## 2018-04-11 DIAGNOSIS — Z452 Encounter for adjustment and management of vascular access device: Secondary | ICD-10-CM | POA: Diagnosis not present

## 2018-04-11 DIAGNOSIS — Z792 Long term (current) use of antibiotics: Secondary | ICD-10-CM | POA: Diagnosis not present

## 2018-04-16 ENCOUNTER — Ambulatory Visit (INDEPENDENT_AMBULATORY_CARE_PROVIDER_SITE_OTHER): Payer: BLUE CROSS/BLUE SHIELD | Admitting: Internal Medicine

## 2018-04-16 ENCOUNTER — Other Ambulatory Visit: Payer: Self-pay

## 2018-04-16 ENCOUNTER — Encounter: Payer: Self-pay | Admitting: Internal Medicine

## 2018-04-16 VITALS — BP 146/79 | HR 65 | Temp 97.6°F | Wt 197.0 lb

## 2018-04-16 DIAGNOSIS — K6812 Psoas muscle abscess: Secondary | ICD-10-CM

## 2018-04-16 NOTE — Progress Notes (Signed)
Patient ID: Jon Choi, male   DOB: 1950-12-15, 68 y.o.   MRN: [id number]  HPI Started doxy on Friday. Hx of melanoma Outpatient Encounter Medications as of 04/16/2018  Medication Sig  . acetaminophen (TYLENOL) 500 MG tablet Take 2 tablets (1,000 mg total) by mouth every 8 (eight) hours.  . celecoxib (CELEBREX) 200 MG capsule Take 200 mg by mouth every evening.   . docusate sodium (COLACE) 100 MG capsule Take 1 capsule (100 mg total) by mouth 2 (two) times daily.  Marland Kitchen doxycycline (VIBRAMYCIN) 100 MG capsule   . metoprolol succinate (TOPROL-XL) 25 MG 24 hr tablet Take 50 mg by mouth every evening.   Marland Kitchen oxyCODONE (OXY IR/ROXICODONE) 5 MG immediate release tablet Take 1-2 tablets (5-10 mg total) by mouth every 4 (four) hours as needed for moderate pain or severe pain.  . simvastatin (ZOCOR) 20 MG tablet Take 20 mg by mouth every evening.   . temazepam (RESTORIL) 30 MG capsule Take 30 mg by mouth at bedtime.   . ferrous sulfate (FERROUSUL) 325 (65 FE) MG tablet Take 1 tablet (325 mg total) by mouth 3 (three) times daily with meals. (Patient not taking: Reported on 04/16/2018)  . methocarbamol (ROBAXIN) 500 MG tablet Take 1 tablet (500 mg total) by mouth every 6 (six) hours as needed for muscle spasms. (Patient not taking: Reported on 04/16/2018)  . polyethylene glycol (MIRALAX / GLYCOLAX) packet Take 17 g by mouth 2 (two) times daily. (Patient not taking: Reported on 04/16/2018)   No facility-administered encounter medications on file as of 04/16/2018.      Patient Active Problem List   Diagnosis Date Noted  . 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 Spondylolisthesis at L4-L5 level 02/23/2017  . Medication monitoring encounter 10/12/2016  . Abscess   . Prosthetic hip infection (HCC)   . MSSA (methicillin susceptible Staphylococcus aureus) infection   . 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-L5 level 09/09/2016  . ARF  (acute renal failure) (HCC) 09/09/2016  . HLD (hyperlipidemia) 06/22/2015  . Hypertension 06/22/2015  . Malignant melanoma (HCC) 06/22/2015  . Gonalgia 07/01/2013     Health Maintenance Due  Topic Date Due  . TETANUS/TDAP  01/17/1972  . COLONOSCOPY  01/17/2003  . INFLUENZA VACCINE  09/13/2017  . PNA vac Low Risk Adult (1 of 2 - PCV13) 01/16/2018     Review of Systems  Physical Exam   BP (!) 146/79   Pulse 65   Temp 97.6 F (36.4 C) (Oral)   Wt 197 lb (89.4 kg)   BMI 25.99 kg/m    No results found for: CD4TCELL No results found for: CD4TABS No results found for: HIV1RNAQUANT No results found for: HEPBSAB No results found for: RPR, LABRPR  CBC Lab Results  Component Value Date   WBC 9.0 02/27/2018   RBC 3.29 (L) 02/27/2018   HGB 10.7 (L) 02/27/2018   HCT 33.2 (L) 02/27/2018   PLT 196 02/27/2018   MCV 100.9 (H) 02/27/2018   MCH 32.5 02/27/2018   MCHC 32.2 02/27/2018   RDW 12.3 02/27/2018   LYMPHSABS 0.8 09/09/2016   MONOABS 0.7 09/09/2016   EOSABS 0.0 09/09/2016    BMET Lab Results  Component Value Date   NA 140 02/27/2018   K 4.0 02/27/2018   CL 109 02/27/2018   CO2 23 02/27/2018   GLUCOSE 158 (H) 02/27/2018   BUN 15 02/27/2018   CREATININE 0.94  02/27/2018   CALCIUM 8.2 (L) 02/27/2018   GFRNONAA >60 02/27/2018   GFRAA >60 02/27/2018      Assessment and Plan

## 2018-07-15 ENCOUNTER — Ambulatory Visit: Payer: Medicare Other | Admitting: Internal Medicine

## 2018-07-30 IMAGING — XA Imaging study
2 series · 2 of 2 positions shown · non-contrast
Comparison: none

CLINICAL DATA: Chronic low back pain. The patient had excellent
relief of pain from a previous epidural steroid injection. The pain
recurred after 2.5 months. The pain is mostly in his low back with
some radicular pain extending to the left hip. Displacement of the
L4-5 lumbar disc.

[Series 1: ortho standard · 1 of 1 slices shown (1 of 2)]
[im 1/1]
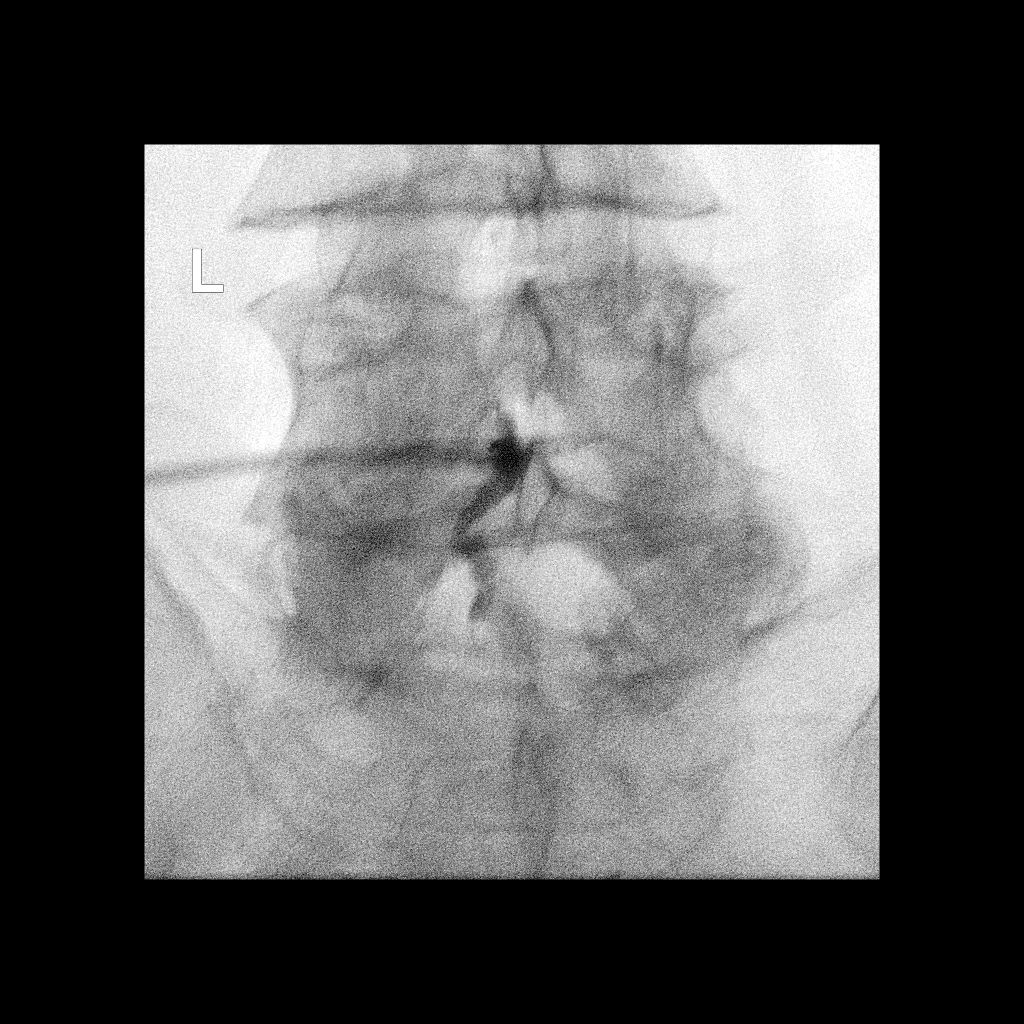

[Series 2: ortho standard · 1 of 1 slices shown (2 of 2)]
[im 1/1]
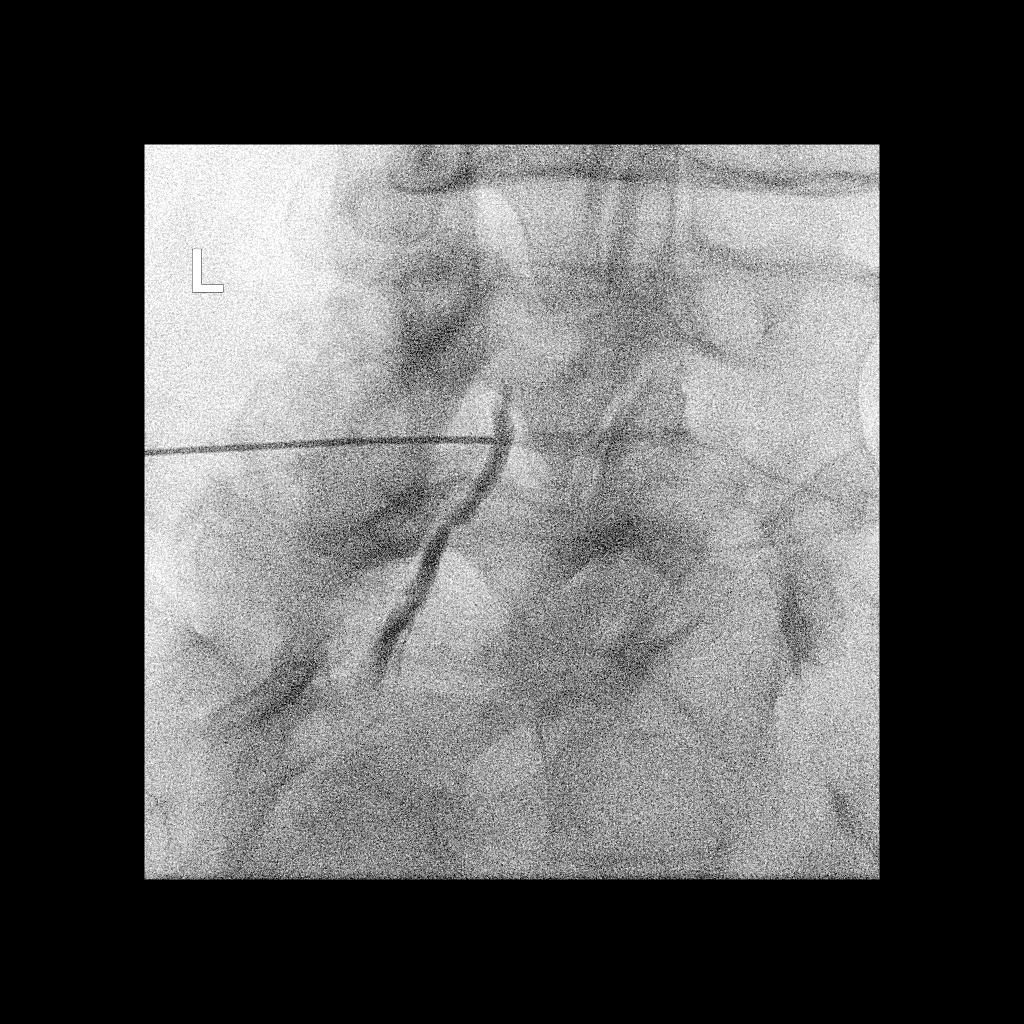

[2 of 2 positions shown; findings below may reference images not displayed]

FLUOROSCOPY TIME:  Radiation Exposure Index (as provided by the
fluoroscopic device): 11.24 uGy*m2

Fluoroscopy Time:  14 seconds

Number of Acquired Images:  0

PROCEDURE:
The procedure, risks, benefits, and alternatives were explained to
the patient. Questions regarding the procedure were encouraged and
answered. The patient understands and consents to the procedure.

LUMBAR EPIDURAL INJECTION:

An interlaminar approach was performed on left at L4-5. The
overlying skin was cleansed and anesthetized. A 20 gauge epidural
needle was advanced using loss-of-resistance technique.

DIAGNOSTIC EPIDURAL INJECTION:

Injection of Isovue-M 200 shows a good epidural pattern with spread
above and below the level of needle placement, primarily on the left
no vascular opacification is seen.

THERAPEUTIC EPIDURAL INJECTION:

120 Mg of Depo-Medrol mixed with 3 mL 1% lidocaine were instilled.
The procedure was well-tolerated, and the patient was discharged
thirty minutes following the injection in good condition.

COMPLICATIONS:
None
IMPRESSION: Technically successful epidural injection on the left L4-5 # to

## 2018-08-19 ENCOUNTER — Encounter: Payer: Self-pay | Admitting: Internal Medicine

## 2018-08-19 ENCOUNTER — Ambulatory Visit (INDEPENDENT_AMBULATORY_CARE_PROVIDER_SITE_OTHER): Payer: BC Managed Care – PPO | Admitting: Internal Medicine

## 2018-08-19 ENCOUNTER — Other Ambulatory Visit: Payer: Self-pay

## 2018-08-19 VITALS — BP 123/75 | HR 56 | Temp 97.5°F | Wt 196.0 lb

## 2018-08-19 DIAGNOSIS — Z96649 Presence of unspecified artificial hip joint: Secondary | ICD-10-CM | POA: Diagnosis not present

## 2018-08-19 DIAGNOSIS — Z5181 Encounter for therapeutic drug level monitoring: Secondary | ICD-10-CM

## 2018-08-19 DIAGNOSIS — T8459XD Infection and inflammatory reaction due to other internal joint prosthesis, subsequent encounter: Secondary | ICD-10-CM | POA: Diagnosis not present

## 2018-08-19 MED ORDER — CEPHALEXIN 500 MG PO CAPS: 500.0000 mg | ORAL_CAPSULE | Freq: Three times a day (TID) | ORAL | 3 refills | Status: DC

## 2018-08-19 NOTE — Progress Notes (Signed)
RFV: follow up for pji  Patient ID: Jon Choi, male   DOB: January 03, 1951, 68 y.o.   MRN: [id number]  HPI S/P resection of right THA and placement of antibiotic spacer. Has been on doxy since beginning of march 2020 but did not tolerate due to skin sensitivity switched to cephalexin bid  Outpatient Encounter Medications as of 08/19/2018  Medication Sig  . acetaminophen (TYLENOL) 500 MG tablet Take 2 tablets (1,000 mg total) by mouth every 8 (eight) hours.  . celecoxib (CELEBREX) 200 MG capsule Take 200 mg by mouth every evening.   Marland Kitchen doxycycline (VIBRAMYCIN) 100 MG capsule   . metoprolol succinate (TOPROL-XL) 25 MG 24 hr tablet Take 50 mg by mouth every evening.   Marland Kitchen oxyCODONE (OXY IR/ROXICODONE) 5 MG immediate release tablet Take 1-2 tablets (5-10 mg total) by mouth every 4 (four) hours as needed for moderate pain or severe pain.  . cephALEXin (KEFLEX) 500 MG capsule   . docusate sodium (COLACE) 100 MG capsule Take 1 capsule (100 mg total) by mouth 2 (two) times daily. (Patient not taking: Reported on 08/19/2018)  . ferrous sulfate (FERROUSUL) 325 (65 FE) MG tablet Take 1 tablet (325 mg total) by mouth 3 (three) times daily with meals. (Patient not taking: Reported on 04/16/2018)  . methocarbamol (ROBAXIN) 500 MG tablet Take 1 tablet (500 mg total) by mouth every 6 (six) hours as needed for muscle spasms. (Patient not taking: Reported on 04/16/2018)  . polyethylene glycol (MIRALAX / GLYCOLAX) packet Take 17 g by mouth 2 (two) times daily. (Patient not taking: Reported on 08/19/2018)  . simvastatin (ZOCOR) 20 MG tablet Take 20 mg by mouth every evening.   . temazepam (RESTORIL) 30 MG capsule Take 30 mg by mouth at bedtime.    No facility-administered encounter medications on file as of 08/19/2018.      Patient Active Problem List   Diagnosis Date Noted  . 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 Infected prosthesis of right hip ([place]) 02/25/2018  . Spondylolisthesis at L4-L5 level 02/23/2017  .  Medication monitoring encounter 10/12/2016  . Abscess   . Prosthetic hip infection ([place])   . MSSA (methicillin susceptible Staphylococcus aureus) infection   . Iliopsoas abscess ([place]) 09/09/2016  . 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-L5 level 09/09/2016  . ARF (acute renal failure) ([place]) 09/09/2016  . HLD (hyperlipidemia) 06/22/2015  . Hypertension 06/22/2015  . Malignant melanoma ([place]) 06/22/2015  . Gonalgia 07/01/2013     Health Maintenance Due  Topic Date Due  . TETANUS/TDAP  01/17/1972  . COLONOSCOPY  01/17/2003  . PNA vac Low Risk Adult (1 of 2 - PCV13) 01/16/2018     Review of Systems Review of Systems  Constitutional: Negative for fever, chills, diaphoresis, activity change, appetite change, fatigue and unexpected weight change.  HENT: Negative for congestion, sore throat, rhinorrhea, sneezing, trouble swallowing and sinus pressure.  Eyes: Negative for photophobia and visual disturbance.  Respiratory: Negative for cough, chest tightness, shortness of breath, wheezing and stridor.  Cardiovascular: Negative for chest pain, palpitations and leg swelling.  Gastrointestinal: Negative for nausea, vomiting, abdominal pain, diarrhea, constipation, blood in stool, abdominal distention and anal bleeding.  Genitourinary: Negative for dysuria, hematuria, flank pain and difficulty urinating.  Musculoskeletal: Negative for myalgias, back pain, joint swelling, arthralgias and gait problem.  Skin: Negative for color change, pallor, rash and wound.  Neurological: Negative for dizziness, tremors, weakness and light-headedness.  Hematological: Negative for adenopathy. Does not bruise/bleed easily.  Psychiatric/Behavioral: Negative for behavioral problems, confusion,  sleep disturbance, dysphoric mood, decreased concentration and agitation.    Physical Exam   BP 123/75   Pulse (!) 56   Temp (!) 97.5 F (36.4 C) (Oral)   Wt 196 lb (88.9 kg)   BMI 25.86 kg/m   Physical Exam  Constitutional: He is  oriented to person, place, and time. He appears well-developed and well-nourished. No distress.  HENT:  Mouth/Throat: Oropharynx is clear and moist. No oropharyngeal exudate.  Cardiovascular: Normal rate, regular rhythm and normal heart sounds. Exam reveals no gallop and no friction rub.  No murmur heard.  Pulmonary/Chest: Effort normal and breath sounds normal. No respiratory distress. He has no wheezes.   Neurological: He is alert and oriented to person, place, and time.  Skin: Skin is warm and dry.  Psychiatric: He has a normal mood and affect. His behavior is normal.    CBC Lab Results  Component Value Date   WBC 9.0 02/27/2018   RBC 3.29 (L) 02/27/2018   HGB 10.7 (L) 02/27/2018   HCT 33.2 (L) 02/27/2018   PLT 196 02/27/2018   MCV 100.9 (H) 02/27/2018   MCH 32.5 02/27/2018   MCHC 32.2 02/27/2018   RDW 12.3 02/27/2018   LYMPHSABS 0.8 09/09/2016   MONOABS 0.7 09/09/2016   EOSABS 0.0 09/09/2016    BMET Lab Results  Component Value Date   NA 140 02/27/2018   K 4.0 02/27/2018   CL 109 02/27/2018   CO2 23 02/27/2018   GLUCOSE 158 (H) 02/27/2018   BUN 15 02/27/2018   CREATININE 0.94 02/27/2018   CALCIUM 8.2 (L) 02/27/2018   GFRNONAA >60 02/27/2018   GFRAA >60 02/27/2018   Lab Results  Component Value Date   ESRSEDRATE 2 08/19/2018   Lab Results  Component Value Date   CRP 0.4 08/19/2018      Assessment and Plan Culture negative pji for right THA Give refill for tid cephalexin Will check sed rate and crp to ensure WNL Will see back in 4 wk

## 2018-08-20 LAB — CBC WITH DIFFERENTIAL/PLATELET
Absolute Monocytes: 549 cells/uL (ref 200–950)
Basophils Absolute: 67 cells/uL (ref 0–200)
Basophils Relative: 1 %
Eosinophils Absolute: 101 cells/uL (ref 15–500)
Eosinophils Relative: 1.5 %
HCT: 43.4 % (ref 38.5–50.0)
Hemoglobin: 14.9 g/dL (ref 13.2–17.1)
Lymphs Abs: 2111 cells/uL (ref 850–3900)
MCH: 32.4 pg (ref 27.0–33.0)
MCHC: 34.3 g/dL (ref 32.0–36.0)
MCV: 94.3 fL (ref 80.0–100.0)
MPV: 9.8 fL (ref 7.5–12.5)
Monocytes Relative: 8.2 %
Neutro Abs: 3873 cells/uL (ref 1500–7800)
Neutrophils Relative %: 57.8 %
Platelets: 254 10*3/uL (ref 140–400)
RBC: 4.6 10*6/uL (ref 4.20–5.80)
RDW: 12 % (ref 11.0–15.0)
Total Lymphocyte: 31.5 %
WBC: 6.7 10*3/uL (ref 3.8–10.8)

## 2018-08-20 LAB — BASIC METABOLIC PANEL
BUN: 14 mg/dL (ref 7–25)
CO2: 26 mmol/L (ref 20–32)
Calcium: 9.7 mg/dL (ref 8.6–10.3)
Chloride: 104 mmol/L (ref 98–110)
Creat: 0.9 mg/dL (ref 0.70–1.25)
Glucose, Bld: 106 mg/dL — ABNORMAL HIGH (ref 65–99)
Potassium: 4.4 mmol/L (ref 3.5–5.3)
Sodium: 137 mmol/L (ref 135–146)

## 2018-08-20 LAB — SEDIMENTATION RATE: Sed Rate: 2 mm/h (ref 0–20)

## 2018-08-20 LAB — C-REACTIVE PROTEIN: CRP: 0.4 mg/L (ref <8.0)

## 2018-10-30 ENCOUNTER — Telehealth: Payer: Self-pay

## 2018-10-30 NOTE — Telephone Encounter (Signed)
Received documentation from insurance company regarding long term disability claim. I am not sure Dr Baxter Flattery is the provider to complete this form but will forward forms if this is correct.   Laverle Patter, RN

## 2018-11-20 ENCOUNTER — Ambulatory Visit: Payer: BC Managed Care – PPO | Admitting: Internal Medicine

## 2018-12-30 ENCOUNTER — Telehealth: Payer: Self-pay

## 2018-12-30 NOTE — Telephone Encounter (Signed)
Patient is calling to see if you wanted him to continue cephalexin.  He was not aware he needed labs or an office visit and states he is willing to reschedule.   Your next available is February 05, 2019.   Please advise on cephalexin refill.

## 2019-01-01 ENCOUNTER — Other Ambulatory Visit: Payer: Self-pay

## 2019-01-01 MED ORDER — CEPHALEXIN 500 MG PO CAPS: 500.0000 mg | ORAL_CAPSULE | Freq: Three times a day (TID) | ORAL | 3 refills | Status: DC

## 2019-01-01 NOTE — Telephone Encounter (Signed)
Continue antibiotic.  Office visit in December.    Left message on machine.   Laverle Patter, RN

## 2019-01-01 NOTE — Telephone Encounter (Signed)
Refill on cephalexin would be great, then see him in dec 23rd.

## 2019-01-03 DIAGNOSIS — E782 Mixed hyperlipidemia: Secondary | ICD-10-CM | POA: Diagnosis not present

## 2019-01-03 DIAGNOSIS — I10 Essential (primary) hypertension: Secondary | ICD-10-CM | POA: Diagnosis not present

## 2019-01-03 DIAGNOSIS — T8451XD Infection and inflammatory reaction due to internal right hip prosthesis, subsequent encounter: Secondary | ICD-10-CM | POA: Diagnosis not present

## 2019-01-03 DIAGNOSIS — G4700 Insomnia, unspecified: Secondary | ICD-10-CM | POA: Diagnosis not present

## 2019-01-15 ENCOUNTER — Other Ambulatory Visit: Payer: BC Managed Care – PPO

## 2019-01-21 DIAGNOSIS — Z125 Encounter for screening for malignant neoplasm of prostate: Secondary | ICD-10-CM | POA: Diagnosis not present

## 2019-01-21 DIAGNOSIS — E782 Mixed hyperlipidemia: Secondary | ICD-10-CM | POA: Diagnosis not present

## 2019-01-21 DIAGNOSIS — I10 Essential (primary) hypertension: Secondary | ICD-10-CM | POA: Diagnosis not present

## 2019-01-28 DIAGNOSIS — D0462 Carcinoma in situ of skin of left upper limb, including shoulder: Secondary | ICD-10-CM | POA: Diagnosis not present

## 2019-01-28 DIAGNOSIS — L821 Other seborrheic keratosis: Secondary | ICD-10-CM | POA: Diagnosis not present

## 2019-01-28 DIAGNOSIS — D1801 Hemangioma of skin and subcutaneous tissue: Secondary | ICD-10-CM | POA: Diagnosis not present

## 2019-01-28 DIAGNOSIS — L570 Actinic keratosis: Secondary | ICD-10-CM | POA: Diagnosis not present

## 2019-01-28 DIAGNOSIS — C44319 Basal cell carcinoma of skin of other parts of face: Secondary | ICD-10-CM | POA: Diagnosis not present

## 2019-01-28 DIAGNOSIS — Z85828 Personal history of other malignant neoplasm of skin: Secondary | ICD-10-CM | POA: Diagnosis not present

## 2019-01-31 ENCOUNTER — Other Ambulatory Visit: Payer: Self-pay

## 2019-01-31 ENCOUNTER — Other Ambulatory Visit: Payer: Medicare Other

## 2019-01-31 DIAGNOSIS — A4901 Methicillin susceptible Staphylococcus aureus infection, unspecified site: Secondary | ICD-10-CM

## 2019-01-31 DIAGNOSIS — T8451XD Infection and inflammatory reaction due to internal right hip prosthesis, subsequent encounter: Secondary | ICD-10-CM | POA: Diagnosis not present

## 2019-02-01 LAB — C-REACTIVE PROTEIN: CRP: 0.3 mg/L (ref <8.0)

## 2019-02-01 LAB — SEDIMENTATION RATE: Sed Rate: 2 mm/h (ref 0–20)

## 2019-04-29 ENCOUNTER — Other Ambulatory Visit: Payer: Self-pay

## 2019-04-29 MED ORDER — CEPHALEXIN 500 MG PO CAPS: 500.0000 mg | ORAL_CAPSULE | Freq: Three times a day (TID) | ORAL | 3 refills | Status: DC

## 2019-06-14 IMAGING — DX DG HIP (WITH OR WITHOUT PELVIS) 2-3V*R*
4 series · 4 of 4 positions shown · non-contrast
Comparison: CT pelvis for abscess drain catheter placement
09/09/2016. CT pelvis 09/08/2016

CLINICAL DATA: Right hip pain.  No known injury.

EXAM:
DG HIP (WITH OR WITHOUT PELVIS) 2-3V RIGHT

[pelvis ap]
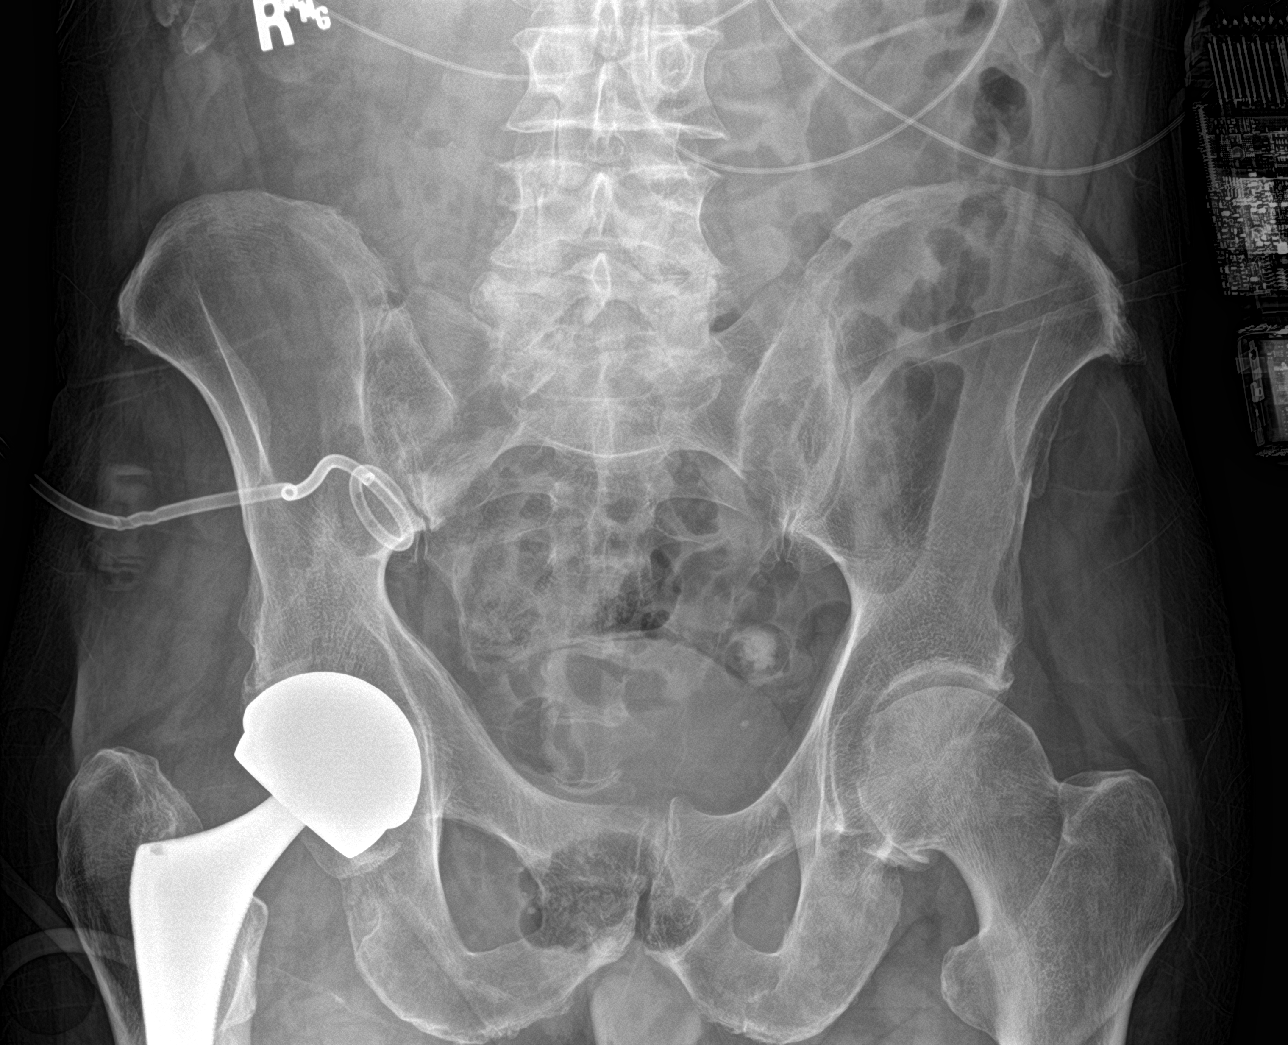

[hip ap (1 of 2)]
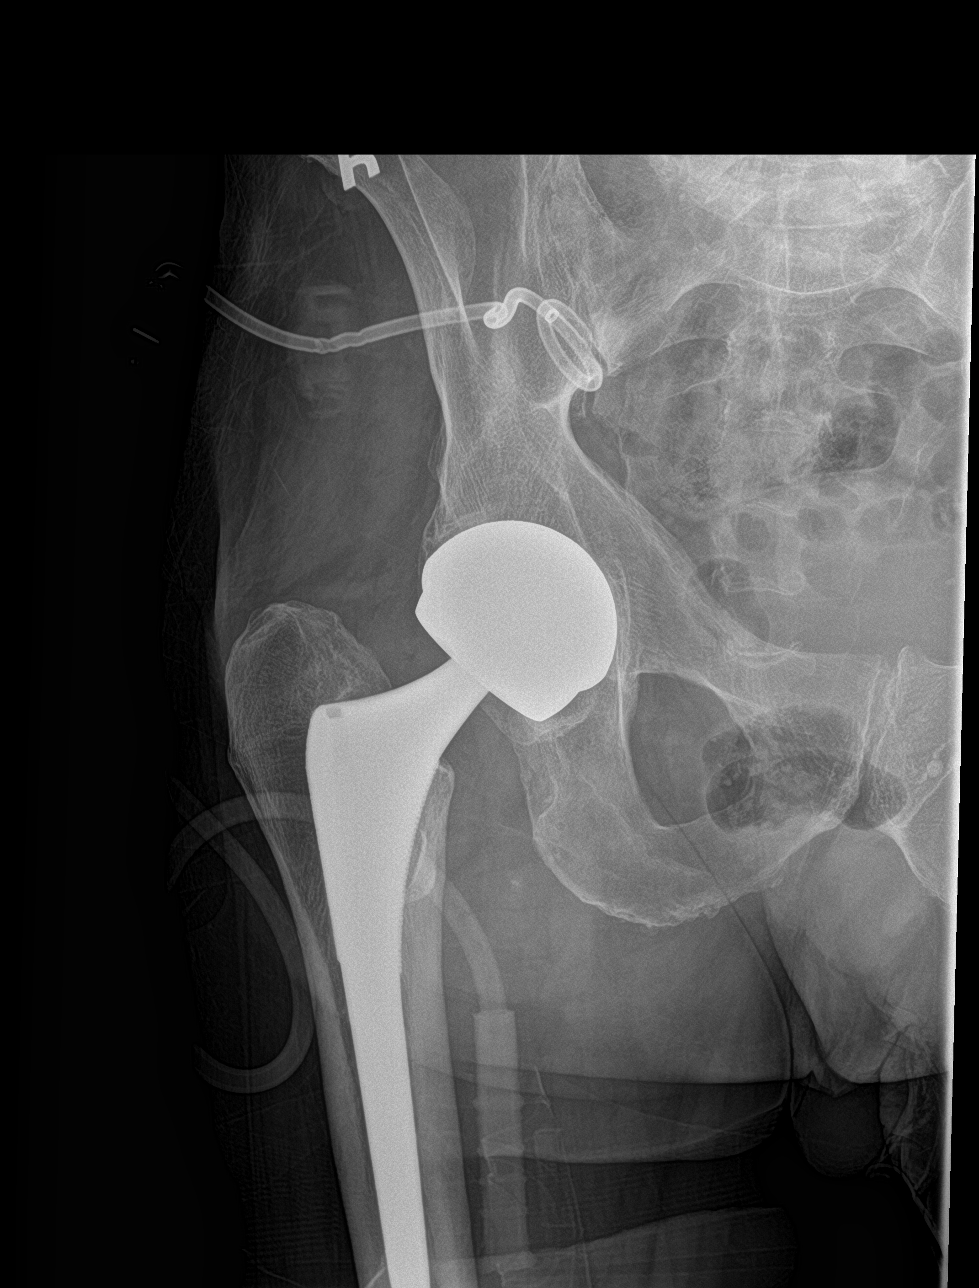

[hip lat]
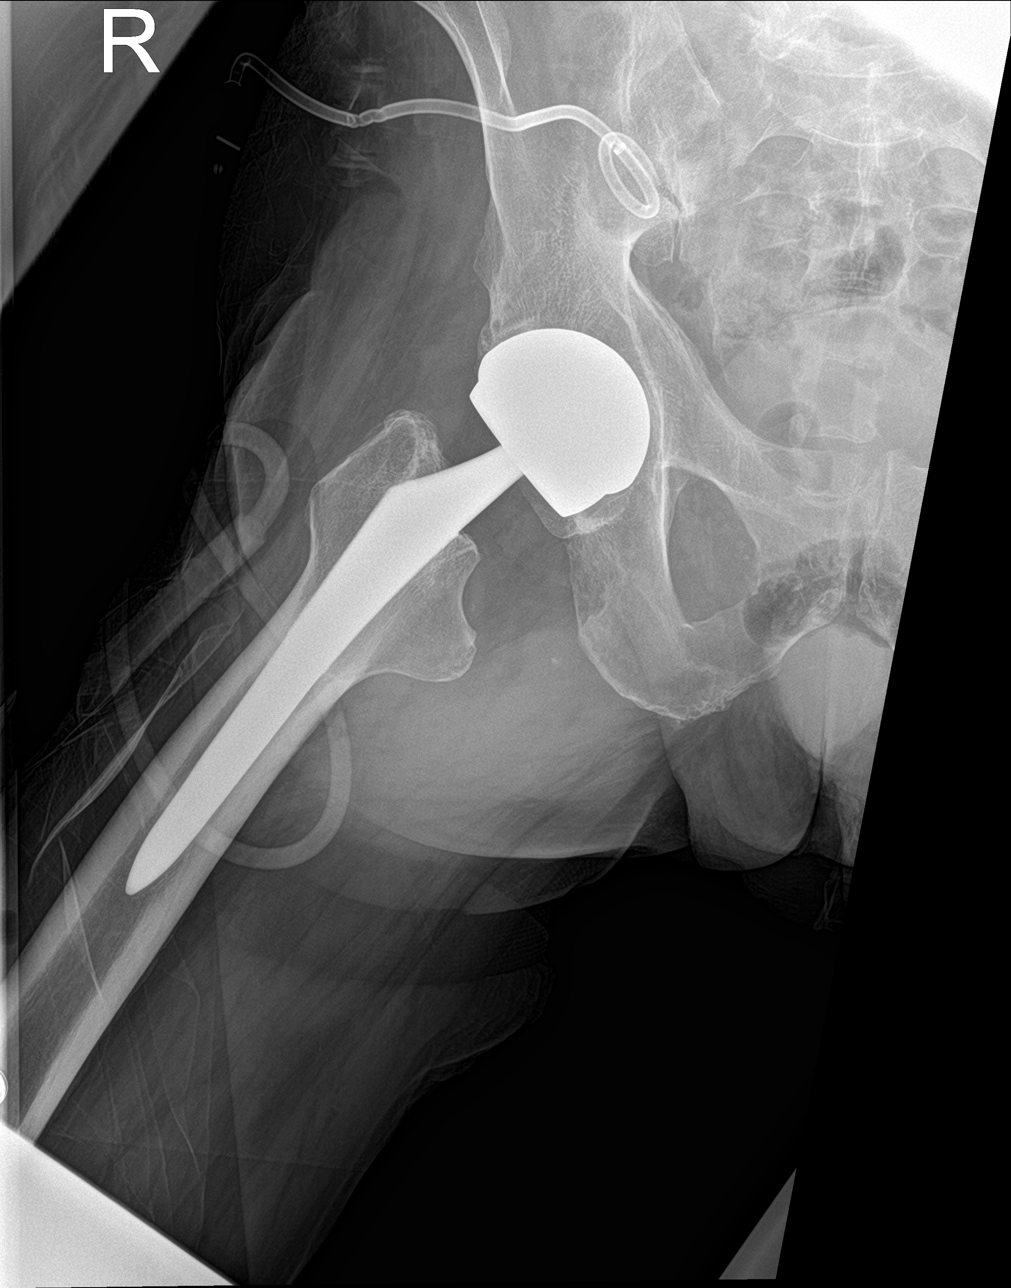

[hip ap (2 of 2)]
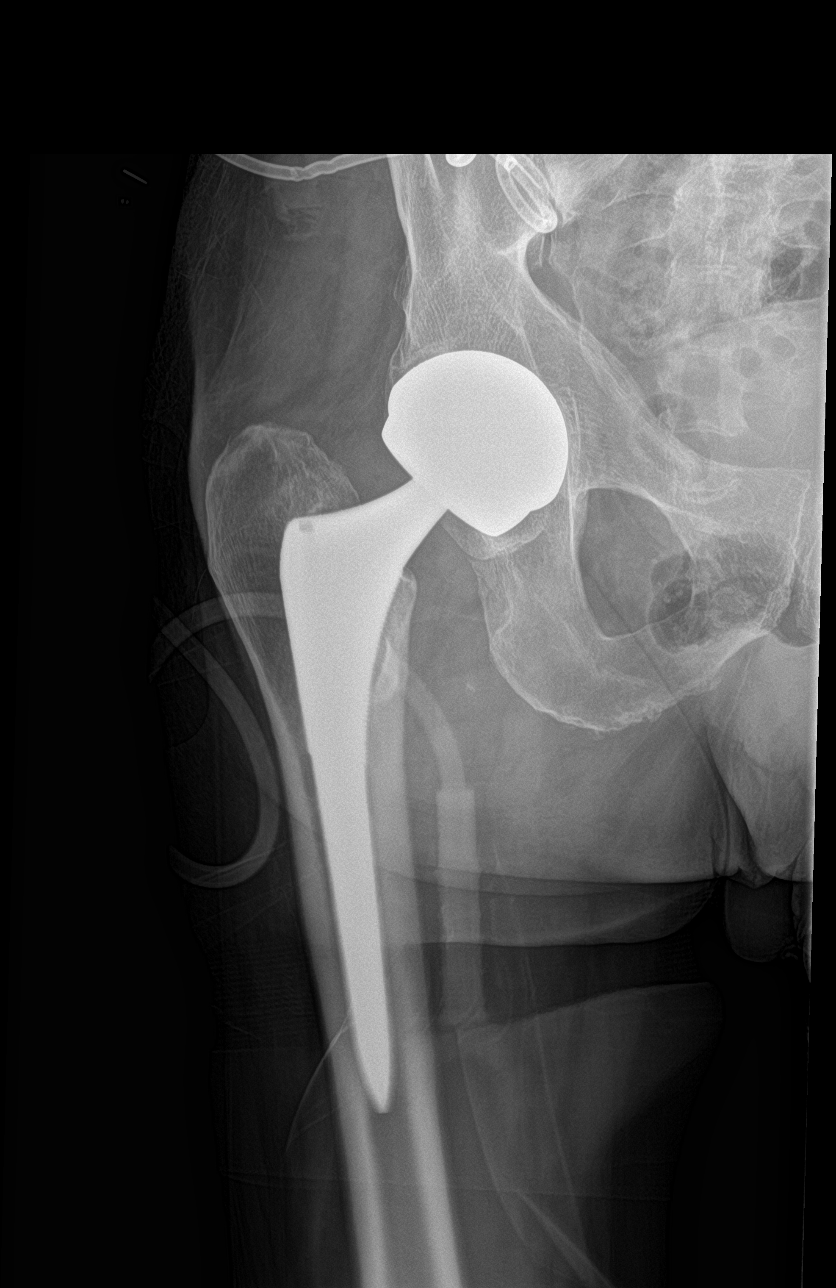

[4 of 4 positions shown; findings below may reference images not displayed]

FINDINGS: Postoperative left hip hemiarthroplasty with non cemented
components. Components appear well seated. No evidence of acute
fracture or dislocation in the pelvis or right hip. Pigtail drainage
catheter projected over the right pelvis. SI joints and symphysis
pubis are not displaced. Left hip demonstrates degenerative changes.
IMPRESSION: Right hip hemiarthroplasty appears well seated. No acute fracture or
dislocation. Drainage catheter projected over the right pelvis.

## 2019-08-04 DIAGNOSIS — L281 Prurigo nodularis: Secondary | ICD-10-CM | POA: Diagnosis not present

## 2019-08-04 DIAGNOSIS — Z85828 Personal history of other malignant neoplasm of skin: Secondary | ICD-10-CM | POA: Diagnosis not present

## 2019-08-04 DIAGNOSIS — L820 Inflamed seborrheic keratosis: Secondary | ICD-10-CM | POA: Diagnosis not present

## 2019-08-04 DIAGNOSIS — L821 Other seborrheic keratosis: Secondary | ICD-10-CM | POA: Diagnosis not present

## 2019-08-04 DIAGNOSIS — L570 Actinic keratosis: Secondary | ICD-10-CM | POA: Diagnosis not present

## 2019-08-04 DIAGNOSIS — C44212 Basal cell carcinoma of skin of right ear and external auricular canal: Secondary | ICD-10-CM | POA: Diagnosis not present

## 2019-09-08 ENCOUNTER — Other Ambulatory Visit: Payer: Self-pay | Admitting: Internal Medicine

## 2019-09-08 ENCOUNTER — Telehealth: Payer: Self-pay

## 2019-09-08 NOTE — Telephone Encounter (Signed)
Called patient after receiving refill request for Keflex from pharmacy. Per last phone note patient was supposed to follow up with MD in December 2020.  Has not been in office since July 2020. Left voicemail with patient requesting he call office back and schedule appointment.  [place]

## 2019-09-15 DIAGNOSIS — C44212 Basal cell carcinoma of skin of right ear and external auricular canal: Secondary | ICD-10-CM | POA: Diagnosis not present

## 2020-01-23 DIAGNOSIS — G4700 Insomnia, unspecified: Secondary | ICD-10-CM | POA: Diagnosis not present

## 2020-01-23 DIAGNOSIS — I10 Essential (primary) hypertension: Secondary | ICD-10-CM | POA: Diagnosis not present

## 2020-01-23 DIAGNOSIS — Z23 Encounter for immunization: Secondary | ICD-10-CM | POA: Diagnosis not present

## 2020-01-23 DIAGNOSIS — E782 Mixed hyperlipidemia: Secondary | ICD-10-CM | POA: Diagnosis not present

## 2020-01-23 DIAGNOSIS — Z1159 Encounter for screening for other viral diseases: Secondary | ICD-10-CM | POA: Diagnosis not present

## 2020-01-23 DIAGNOSIS — Z0000 Encounter for general adult medical examination without abnormal findings: Secondary | ICD-10-CM | POA: Diagnosis not present

## 2020-01-23 DIAGNOSIS — M48061 Spinal stenosis, lumbar region without neurogenic claudication: Secondary | ICD-10-CM | POA: Diagnosis not present

## 2020-01-23 DIAGNOSIS — Z125 Encounter for screening for malignant neoplasm of prostate: Secondary | ICD-10-CM | POA: Diagnosis not present

## 2020-01-26 ENCOUNTER — Other Ambulatory Visit: Payer: Self-pay | Admitting: Family Medicine

## 2020-01-26 DIAGNOSIS — Z87891 Personal history of nicotine dependence: Secondary | ICD-10-CM

## 2020-01-28 DIAGNOSIS — C44229 Squamous cell carcinoma of skin of left ear and external auricular canal: Secondary | ICD-10-CM | POA: Diagnosis not present

## 2020-01-28 DIAGNOSIS — C44712 Basal cell carcinoma of skin of right lower limb, including hip: Secondary | ICD-10-CM | POA: Diagnosis not present

## 2020-01-28 DIAGNOSIS — Z85828 Personal history of other malignant neoplasm of skin: Secondary | ICD-10-CM | POA: Diagnosis not present

## 2020-01-28 DIAGNOSIS — L821 Other seborrheic keratosis: Secondary | ICD-10-CM | POA: Diagnosis not present

## 2020-02-11 ENCOUNTER — Other Ambulatory Visit: Payer: Self-pay | Admitting: Advanced Practice Midwife

## 2020-02-11 ENCOUNTER — Other Ambulatory Visit: Payer: Self-pay

## 2020-02-17 ENCOUNTER — Ambulatory Visit
Admission: RE | Admit: 2020-02-17 | Discharge: 2020-02-17 | Disposition: A | Discharge status: Home / Self Care | Payer: Medicare Other | Source: Ambulatory Visit | Attending: Family Medicine | Admitting: Family Medicine

## 2020-02-17 DIAGNOSIS — Z87891 Personal history of nicotine dependence: Secondary | ICD-10-CM

## 2020-02-17 DIAGNOSIS — Z136 Encounter for screening for cardiovascular disorders: Secondary | ICD-10-CM | POA: Diagnosis not present

## 2020-08-03 DIAGNOSIS — L570 Actinic keratosis: Secondary | ICD-10-CM | POA: Diagnosis not present

## 2020-08-03 DIAGNOSIS — D692 Other nonthrombocytopenic purpura: Secondary | ICD-10-CM | POA: Diagnosis not present

## 2020-08-03 DIAGNOSIS — D225 Melanocytic nevi of trunk: Secondary | ICD-10-CM | POA: Diagnosis not present

## 2020-08-03 DIAGNOSIS — Z85828 Personal history of other malignant neoplasm of skin: Secondary | ICD-10-CM | POA: Diagnosis not present

## 2020-08-03 DIAGNOSIS — L821 Other seborrheic keratosis: Secondary | ICD-10-CM | POA: Diagnosis not present

## 2021-02-08 DIAGNOSIS — M48061 Spinal stenosis, lumbar region without neurogenic claudication: Secondary | ICD-10-CM | POA: Diagnosis not present

## 2021-02-08 DIAGNOSIS — G4700 Insomnia, unspecified: Secondary | ICD-10-CM | POA: Diagnosis not present

## 2021-02-08 DIAGNOSIS — Z0000 Encounter for general adult medical examination without abnormal findings: Secondary | ICD-10-CM | POA: Diagnosis not present

## 2021-02-08 DIAGNOSIS — Z125 Encounter for screening for malignant neoplasm of prostate: Secondary | ICD-10-CM | POA: Diagnosis not present

## 2021-02-08 DIAGNOSIS — Z23 Encounter for immunization: Secondary | ICD-10-CM | POA: Diagnosis not present

## 2021-02-08 DIAGNOSIS — I10 Essential (primary) hypertension: Secondary | ICD-10-CM | POA: Diagnosis not present

## 2021-02-08 DIAGNOSIS — E782 Mixed hyperlipidemia: Secondary | ICD-10-CM | POA: Diagnosis not present

## 2021-02-23 DIAGNOSIS — C44712 Basal cell carcinoma of skin of right lower limb, including hip: Secondary | ICD-10-CM | POA: Diagnosis not present

## 2021-02-23 DIAGNOSIS — Z85828 Personal history of other malignant neoplasm of skin: Secondary | ICD-10-CM | POA: Diagnosis not present

## 2021-02-23 DIAGNOSIS — D692 Other nonthrombocytopenic purpura: Secondary | ICD-10-CM | POA: Diagnosis not present

## 2021-02-23 DIAGNOSIS — L570 Actinic keratosis: Secondary | ICD-10-CM | POA: Diagnosis not present

## 2021-02-23 DIAGNOSIS — D1801 Hemangioma of skin and subcutaneous tissue: Secondary | ICD-10-CM | POA: Diagnosis not present

## 2021-02-23 DIAGNOSIS — L821 Other seborrheic keratosis: Secondary | ICD-10-CM | POA: Diagnosis not present

## 2021-11-01 DIAGNOSIS — Z85828 Personal history of other malignant neoplasm of skin: Secondary | ICD-10-CM | POA: Diagnosis not present

## 2021-11-01 DIAGNOSIS — D225 Melanocytic nevi of trunk: Secondary | ICD-10-CM | POA: Diagnosis not present

## 2021-11-01 DIAGNOSIS — L821 Other seborrheic keratosis: Secondary | ICD-10-CM | POA: Diagnosis not present

## 2021-11-01 DIAGNOSIS — L570 Actinic keratosis: Secondary | ICD-10-CM | POA: Diagnosis not present

## 2022-02-22 DIAGNOSIS — Z23 Encounter for immunization: Secondary | ICD-10-CM | POA: Diagnosis not present

## 2022-02-22 DIAGNOSIS — Z125 Encounter for screening for malignant neoplasm of prostate: Secondary | ICD-10-CM | POA: Diagnosis not present

## 2022-02-22 DIAGNOSIS — Z0000 Encounter for general adult medical examination without abnormal findings: Secondary | ICD-10-CM | POA: Diagnosis not present

## 2022-02-22 DIAGNOSIS — I10 Essential (primary) hypertension: Secondary | ICD-10-CM | POA: Diagnosis not present

## 2022-02-22 DIAGNOSIS — E782 Mixed hyperlipidemia: Secondary | ICD-10-CM | POA: Diagnosis not present

## 2022-11-07 DIAGNOSIS — D692 Other nonthrombocytopenic purpura: Secondary | ICD-10-CM | POA: Diagnosis not present

## 2022-11-07 DIAGNOSIS — L570 Actinic keratosis: Secondary | ICD-10-CM | POA: Diagnosis not present

## 2022-11-07 DIAGNOSIS — Z85828 Personal history of other malignant neoplasm of skin: Secondary | ICD-10-CM | POA: Diagnosis not present

## 2022-11-07 DIAGNOSIS — L821 Other seborrheic keratosis: Secondary | ICD-10-CM | POA: Diagnosis not present

## 2022-11-07 DIAGNOSIS — C4441 Basal cell carcinoma of skin of scalp and neck: Secondary | ICD-10-CM | POA: Diagnosis not present

## 2023-02-27 ENCOUNTER — Ambulatory Visit (INDEPENDENT_AMBULATORY_CARE_PROVIDER_SITE_OTHER): Payer: BC Managed Care – PPO | Admitting: Physician Assistant

## 2023-02-27 ENCOUNTER — Encounter: Payer: Self-pay | Admitting: Physician Assistant

## 2023-02-27 ENCOUNTER — Other Ambulatory Visit (INDEPENDENT_AMBULATORY_CARE_PROVIDER_SITE_OTHER): Payer: BC Managed Care – PPO

## 2023-02-27 DIAGNOSIS — G8929 Other chronic pain: Secondary | ICD-10-CM | POA: Diagnosis not present

## 2023-02-27 DIAGNOSIS — M25561 Pain in right knee: Secondary | ICD-10-CM | POA: Diagnosis not present

## 2023-02-27 MED ORDER — LIDOCAINE HCL 1 % IJ SOLN
3.0000 mL | INTRAMUSCULAR | Status: AC | PRN
  Administered 2023-02-27: 3 mL

## 2023-02-27 MED ORDER — METHYLPREDNISOLONE ACETATE 40 MG/ML IJ SUSP
40.0000 mg | INTRAMUSCULAR | Status: AC | PRN
  Administered 2023-02-27: 40 mg via INTRA_ARTICULAR

## 2023-02-27 NOTE — Progress Notes (Signed)
 Office Visit Note   Patient: Jon Choi           Date of Birth: 02-03-1951           MRN: [id number] Visit Date: 02/27/2023              Requested by: Loreli Kins, MD [address] [place],  [place] [address] PCP: Loreli Kins, MD   Assessment & Plan: Visit Diagnoses:  1. Chronic pain of right knee     Plan: Patient is a pleasant 73 year old gentleman with a 2-week history of right knee swelling.  He believes he has had fluid accumulate in the knee.  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  He has had no problems with this and has been off antibiotics over a year.  He currently has a spacer in but is hesitant to transition to a new total hip because he is doing fairly well.  He is had some type of surgery on both of his knees but is unsure what this is.  It was done in the 70s.  No signs of infection today I did go forward and aspirate approximately 45 cc of blood-tinged fluid no evidence of any purulence or infection.  May follow-up with me as needed  Follow-Up Instructions: No follow-ups on file.   Orders:  Orders Placed This Encounter  Procedures  . XR Knee 1-2 Views Right   No orders of the defined types were placed in this encounter.     Procedures: Large Joint Inj on 02/27/2023 9:08 AM Indications: pain and diagnostic evaluation Details: 25 G 1.5 in needle  Arthrogram: No  Medications: 40 mg methylPREDNISolone  acetate 40 MG/ML; 3 mL lidocaine  1 % Aspirate: blood-tinged Outcome: tolerated well, no immediate complications  Will consent the superior lateral pouch was prepped with alcohol x 2 and Betadine  x 2.  4 cc of lidocaine  plain was injected on a 25-gauge needle.  After adequate analgesia area was reprepped with alcohol and Betadine .  45 cc of blood-tinged joint fluid was aspirated without difficulty.  40 mg of Depo-Medrol  and 2 cc of bupivacaine  were injected patient tolerated the procedure  well Band-Aid and Ace wrap applied Procedure, treatment alternatives, risks and benefits explained, specific risks discussed. Consent was given by the patient.     Clinical Data: No additional findings.   Subjective: No chief complaint on file.   HPI patient is a pleasant 73 year old gentleman who has a chief complaint of fluid on his right knee x 2 weeks.  Denies any particular injury.  Last saw Dr. Vernetta in 2018.  He has tried Aleve and cold packs with no relief.  He has had multiple surgeries on his right hip for staph infection.  Currently has a spacer.  Again denies any recent fever or chills has not had any antibiotics for over a year   Review of Systems  All other systems reviewed and are negative.    Objective: Vital Signs: There were no vitals taken for this visit.  Physical Exam Constitutional:      Appearance: Normal appearance.  Skin:    General: Skin is warm and dry.  Neurological:     General: No focal deficit present.     Mental Status: He is alert.  Psychiatric:        Mood and Affect: Mood normal.        Behavior: Behavior normal.  Ortho Exam Examination of his knee he has a moderate effusion no heat no erythema no cellulitis compartments are soft and compressible Specialty Comments:  No specialty comments available.  Imaging: XR Knee 1-2 Views Right Result Date: 02/27/2023 Radiographs of his right knee demonstrate advanced tricompartmental arthritis with bone-on-bone findings he does have some calcifications posteriorly no acute changes    PMFS History: Patient Active Problem List   Diagnosis Date Noted  . 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 Spondylolisthesis at L4-L5 level 02/23/2017  . Medication monitoring encounter 10/12/2016  . Abscess   . Prosthetic hip infection (HCC)   . MSSA (methicillin susceptible Staphylococcus aureus) infection   . 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-L5 level 09/09/2016  . ARF (acute renal failure) (HCC) 09/09/2016  . HLD (hyperlipidemia) 06/22/2015  . Hypertension 06/22/2015  . Malignant melanoma (HCC) 06/22/2015  . Pain in right knee 07/01/2013   Past Medical History:  Diagnosis Date  . Arthritis   . Back pain   . History of kidney stones   . Hypertension   . Insomnia   . Melanoma (HCC) 2010   facial--multiple excision with plastic surgery     Family History  Problem Relation Age of Onset  . Hypertension Other     Past Surgical History:  Procedure Laterality Date  . EXCISION OF BACK LESION    . KNEE ARTHROSCOPY     BILATERAL  . MELANOMA EXCISION     FACE +  SEVERAL SURGERIES    . REIMPLANTATION OF TOTAL HIP Right 02/25/2018   Procedure: Excisional and nonexcisional debridement left hip, revision cemented antibiotic acetabulum.;  Surgeon: Ernie Cough, MD;  Location: [facility];  Service: Orthopedics;  Laterality: Right;  120 mins  . TOTAL HIP ARTHROPLASTY Right 09/12/2016   Procedure: IRRIGATION AND DEBRIDEMENT HIP WITH FEMORAL HEAD AND ACETABULAR CUP EXCHANGE;  Surgeon: Ernie Cough, MD;  Location: [facility] OR;  Service: Orthopedics;  Laterality: Right;  . TOTAL HIP ARTHROPLASTY Right 2007   Social History   Occupational History  . Not on file  Tobacco Use  . Smoking status: Former    Types: E-cigarettes  . Smokeless tobacco: Never  . Tobacco comments:    quit 2015  Vaping Use  . Vaping status: Every Day  . Substances: Nicotine , Flavoring  Substance and Sexual Activity  . Alcohol use: Yes    Comment: occ  . Drug use: No  . Sexual activity: Not on file

## 2023-02-28 DIAGNOSIS — Z125 Encounter for screening for malignant neoplasm of prostate: Secondary | ICD-10-CM | POA: Diagnosis not present

## 2023-02-28 DIAGNOSIS — E782 Mixed hyperlipidemia: Secondary | ICD-10-CM | POA: Diagnosis not present

## 2023-02-28 DIAGNOSIS — Z0000 Encounter for general adult medical examination without abnormal findings: Secondary | ICD-10-CM | POA: Diagnosis not present

## 2023-02-28 DIAGNOSIS — G4700 Insomnia, unspecified: Secondary | ICD-10-CM | POA: Diagnosis not present

## 2023-02-28 DIAGNOSIS — I10 Essential (primary) hypertension: Secondary | ICD-10-CM | POA: Diagnosis not present

## 2023-04-23 ENCOUNTER — Ambulatory Visit: Payer: BC Managed Care – PPO | Admitting: Physician Assistant

## 2023-05-07 DIAGNOSIS — M1711 Unilateral primary osteoarthritis, right knee: Secondary | ICD-10-CM | POA: Diagnosis not present

## 2023-06-19 NOTE — Patient Instructions (Signed)
 SURGICAL WAITING ROOM VISITATION  Patients having surgery or a procedure may have no more than 2 support people in the waiting area - these visitors may rotate.    Children under the age of 43 must have an adult with them who is not the patient.  Due to an increase in RSV and influenza rates and associated hospitalizations, children ages 86 and under may not visit patients in [facility] hospitals.  Visitors with respiratory illnesses are discouraged from visiting and should remain at home.  If the patient needs to stay at the hospital during part of their recovery, the visitor guidelines for inpatient rooms apply. Pre-op nurse will coordinate an appropriate time for 1 support person to accompany patient in pre-op.  This support person may not rotate.    Please refer to the [facility] website for the visitor guidelines for Inpatients (after your surgery is over and you are in a regular room).       Your procedure is scheduled on: 07-03-23   Report to [facility] Main Entrance    Report to admitting at        10:25  AM   Call this number if you have problems the morning of surgery [phone]   Do not eat food :After Midnight.   After Midnight you may have the following liquids until _0955_____ AM/  DAY OF SURGERY   then nothing by mouth  Water  Non-Citrus Juices (without pulp, NO RED-Apple, White grape, White cranberry) Black Coffee (NO MILK/CREAM OR CREAMERS, sugar ok)  Clear Tea (NO MILK/CREAM OR CREAMERS, sugar ok) regular and decaf                             Plain Jell-O (NO RED)                                           Fruit ices (not with fruit pulp, NO RED)                                     Popsicles (NO RED)                                                               Sports drinks like Gatorade (NO RED)                 The day of surgery:  Drink ONE (1) Pre-Surgery Clear Ensure  BY 0955  AM the morning of surgery. Drink in one sitting. Do not sip.  This  drink was given to you during your hospital  pre-op appointment visit. Nothing else to drink after completing the  Pre-Surgery Clear Ensure .          If you have questions, please contact your surgeon's office.   FOLLOW ANY ADDITIONAL PRE OP INSTRUCTIONS YOU RECEIVED FROM YOUR SURGEON'S OFFICE!!!     Oral Hygiene is also important to reduce your risk of infection.  Remember - BRUSH YOUR TEETH THE MORNING OF SURGERY WITH YOUR REGULAR TOOTHPASTE  DENTURES WILL BE REMOVED PRIOR TO SURGERY PLEASE DO NOT APPLY "Poly grip" OR ADHESIVES!!!   Do NOT smoke after Midnight   Stop all vitamins and herbal supplements 7 days before surgery.   Take these medicines the morning of surgery with A SIP OF WATER : None , tylenol  if needed  DO NOT TAKE ANY ORAL DIABETIC MEDICATIONS DAY OF YOUR SURGERY  Bring CPAP mask and tubing day of surgery.                              You may not have any metal on your body including hair pins, jewelry, and body piercing             Do not wear , lotions, powders, /cologne, or deodorant                Men may shave face and neck.   Do not bring valuables to the hospital. [facility] IS NOT             RESPONSIBLE   FOR VALUABLES.   Contacts, glasses, dentures or bridgework may not be worn into surgery.   Bring small overnight bag day of surgery.   DO NOT BRING YOUR HOME MEDICATIONS TO THE HOSPITAL. PHARMACY WILL DISPENSE MEDICATIONS LISTED ON YOUR MEDICATION LIST TO YOU DURING YOUR ADMISSION IN THE HOSPITAL!    Patients discharged on the day of surgery will not be allowed to drive home.  Someone NEEDS to stay with you for the first 24 hours after anesthesia.   Special Instructions: Bring a copy of your healthcare power of attorney and living will documents the day of surgery if you haven't scanned them before.              Please read over the following fact sheets you were given: IF YOU HAVE QUESTIONS ABOUT YOUR  PRE-OP INSTRUCTIONS PLEASE CALL [phone]   . If you test positive for Covid or have been in contact with anyone that has tested positive in the last 10 days please notify you surgeon.      Pre-operative 5 CHG Bath Instructions   You can play a key role in reducing the risk of infection after surgery. Your skin needs to be as free of germs as possible. You can reduce the number of germs on your skin by washing with CHG (chlorhexidine  gluconate) soap before surgery. CHG is an antiseptic soap that kills germs and continues to kill germs even after washing.   DO NOT use if you have an allergy to chlorhexidine /CHG or antibacterial soaps. If your skin becomes reddened or irritated, stop using the CHG and notify one of our RNs at [phone].   Please shower with the CHG soap starting 4 days before surgery using the following schedule:     Please keep in mind the following:  DO NOT shave, including legs and underarms, starting the day of your first shower.   You may shave your face at any point before/day of surgery.  Place clean sheets on your bed the day you start using CHG soap. Use a clean washcloth (not used since being washed) for each shower. DO NOT sleep with pets once you start using the CHG.   CHG Shower Instructions:  If you choose to wash your hair and private area, wash first with your normal shampoo/soap.  After you  use shampoo/soap, rinse your hair and body thoroughly to remove shampoo/soap residue.  Turn the water  OFF and apply about 3 tablespoons (45 ml) of CHG soap to a CLEAN washcloth.  Apply CHG soap ONLY FROM YOUR NECK DOWN TO YOUR TOES (washing for 3-5 minutes)  DO NOT use CHG soap on face, private areas, open wounds, or sores.  Pay special attention to the area where your surgery is being performed.  If you are having back surgery, having someone wash your back for you may be helpful. Wait 2 minutes after CHG soap is applied, then you may rinse off the CHG soap.   Pat dry with a clean towel  Put on clean clothes/pajamas   If you choose to wear lotion, please use ONLY the CHG-compatible lotions on the back of this paper.     Additional instructions for the day of surgery: DO NOT APPLY any lotions, deodorants, cologne, or perfumes.   Put on clean/comfortable clothes.  Brush your teeth.  Ask your nurse before applying any prescription medications to the skin.      CHG Compatible Lotions   Aveeno Moisturizing lotion  Cetaphil Moisturizing Cream  Cetaphil Moisturizing Lotion  Clairol Herbal Essence Moisturizing Lotion, Dry Skin  Clairol Herbal Essence Moisturizing Lotion, Extra Dry Skin  Clairol Herbal Essence Moisturizing Lotion, Normal Skin  Curel Age Defying Therapeutic Moisturizing Lotion with Alpha Hydroxy  Curel Extreme Care Body Lotion  Curel Soothing Hands Moisturizing Hand Lotion  Curel Therapeutic Moisturizing Cream, Fragrance-Free  Curel Therapeutic Moisturizing Lotion, Fragrance-Free  Curel Therapeutic Moisturizing Lotion, Original Formula  Eucerin Daily Replenishing Lotion  Eucerin Dry Skin Therapy Plus Alpha Hydroxy Crme  Eucerin Dry Skin Therapy Plus Alpha Hydroxy Lotion  Eucerin Original Crme  Eucerin Original Lotion  Eucerin Plus Crme Eucerin Plus Lotion  Eucerin TriLipid Replenishing Lotion  Keri Anti-Bacterial Hand Lotion  Keri Deep Conditioning Original Lotion Dry Skin Formula Softly Scented  Keri Deep Conditioning Original Lotion, Fragrance Free Sensitive Skin Formula  Keri Lotion Fast Absorbing Fragrance Free Sensitive Skin Formula  Keri Lotion Fast Absorbing Softly Scented Dry Skin Formula  Keri Original Lotion  Keri Skin Renewal Lotion Keri Silky Smooth Lotion  Keri Silky Smooth Sensitive Skin Lotion  Nivea Body Creamy Conditioning Oil  Nivea Body Extra Enriched Lotion  Nivea Body Original Lotion  Nivea Body Sheer Moisturizing Lotion Nivea Crme  Nivea Skin Firming Lotion  NutraDerm 30 Skin Lotion   NutraDerm Skin Lotion  NutraDerm Therapeutic Skin Cream  NutraDerm Therapeutic Skin Lotion  ProShield Protective Hand Cream  Provon moisturizing lotion    Incentive Spirometer  An incentive spirometer is a tool that can help keep your lungs clear and active. This tool measures how well you are filling your lungs with each breath. Taking long deep breaths may help reverse or decrease the chance of developing breathing (pulmonary) problems (especially infection) following: A long period of time when you are unable to move or be active. BEFORE THE PROCEDURE  If the spirometer includes an indicator to show your best effort, your nurse or respiratory therapist will set it to a desired goal. If possible, sit up straight or lean slightly forward. Try not to slouch. Hold the incentive spirometer in an upright position. INSTRUCTIONS FOR USE  Sit on the edge of your bed if possible, or sit up as far as you can in bed or on a chair. Hold the incentive spirometer in an upright position. Breathe out normally. Place the mouthpiece in your mouth and seal  your lips tightly around it. Breathe in slowly and as deeply as possible, raising the piston or the ball toward the top of the column. Hold your breath for 3-5 seconds or for as long as possible. Allow the piston or ball to fall to the bottom of the column. Remove the mouthpiece from your mouth and breathe out normally. Rest for a few seconds and repeat Steps 1 through 7 at least 10 times every 1-2 hours when you are awake. Take your time and take a few normal breaths between deep breaths. The spirometer may include an indicator to show your best effort. Use the indicator as a goal to work toward during each repetition. After each set of 10 deep breaths, practice coughing to be sure your lungs are clear. If you have an incision (the cut made at the time of surgery), support your incision when coughing by placing a pillow or rolled up towels firmly against  it. Once you are able to get out of bed, walk around indoors and cough well. You may stop using the incentive spirometer when instructed by your caregiver.  RISKS AND COMPLICATIONS Take your time so you do not get dizzy or light-headed. If you are in pain, you may need to take or ask for pain medication before doing incentive spirometry. It is harder to take a deep breath if you are having pain. AFTER USE Rest and breathe slowly and easily. It can be helpful to keep track of a log of your progress. Your caregiver can provide you with a simple table to help with this. If you are using the spirometer at home, follow these instructions: SEEK MEDICAL CARE IF:  You are having difficultly using the spirometer. You have trouble using the spirometer as often as instructed. Your pain medication is not giving enough relief while using the spirometer. You develop fever of 100.5 F (38.1 C) or higher. SEEK IMMEDIATE MEDICAL CARE IF:  You cough up bloody sputum that had not been present before. You develop fever of 102 F (38.9 C) or greater. You develop worsening pain at or near the incision site. MAKE SURE YOU:  Understand these instructions. Will watch your condition. Will get help right away if you are not doing well or get worse. Document Released: 06/12/2006 Document Revised: 04/24/2011 Document Reviewed: 08/13/2006 [facility] Patient Information 2014 [place], [place].   ________________________________________________________________________

## 2023-06-19 NOTE — Progress Notes (Addendum)
 PCP - Glena Landau, MD Cardiologist - no  PPM/ICD -  Device Orders -  Rep Notified -   Chest x-ray -  EKG -  Stress Test -  ECHO -  Cardiac Cath -   Sleep Study -  CPAP -   Fasting Blood Sugar -  Checks Blood Sugar __N/A___ times a day  Blood Thinner Instructions: Aspirin  Instructions:81mg  asa stop 5 days   ERAS Protcol - PRE-SURGERY Ensure    COVID vaccine -yes  Activity--Able to complete ADL's with no CP or SoB still mows clean gutters and trims bushes Anesthesia review: HTN, Spinal stenosis L4-L5 s/p fusion  Patient denies shortness of breath, fever, cough and chest pain at PAT appointment   All instructions explained to the patient, with a verbal understanding of the material. Patient agrees to go over the instructions while at home for a better understanding. Patient also instructed to self quarantine after being tested for COVID-19. The opportunity to ask questions was provided.

## 2023-06-20 ENCOUNTER — Encounter (HOSPITAL_COMMUNITY)
Admission: RE | Admit: 2023-06-20 | Discharge: 2023-06-20 | Disposition: A | Discharge status: Home / Self Care | Source: Ambulatory Visit | Attending: Orthopedic Surgery | Admitting: Orthopedic Surgery

## 2023-06-20 ENCOUNTER — Other Ambulatory Visit: Payer: Self-pay

## 2023-06-20 ENCOUNTER — Encounter (HOSPITAL_COMMUNITY): Payer: Self-pay

## 2023-06-20 VITALS — BP 164/98 | HR 57 | Temp 98.0°F | Resp 16 | Ht 73.0 in | Wt 189.0 lb

## 2023-06-20 DIAGNOSIS — I10 Essential (primary) hypertension: Secondary | ICD-10-CM | POA: Diagnosis not present

## 2023-06-20 DIAGNOSIS — Z01818 Encounter for other preprocedural examination: Secondary | ICD-10-CM | POA: Insufficient documentation

## 2023-06-20 LAB — BASIC METABOLIC PANEL WITH GFR
Anion gap: 5 (ref 5–15)
BUN: 20 mg/dL (ref 8–23)
CO2: 29 mmol/L (ref 22–32)
Calcium: 9.1 mg/dL (ref 8.9–10.3)
Chloride: 103 mmol/L (ref 98–111)
Creatinine, Ser: 1.18 mg/dL (ref 0.61–1.24)
GFR, Estimated: >60 mL/min (ref >60)
Glucose, Bld: 108 mg/dL — ABNORMAL HIGH (ref 70–99)
Potassium: 4.3 mmol/L (ref 3.5–5.1)
Sodium: 137 mmol/L (ref 135–145)

## 2023-06-20 LAB — SURGICAL PCR SCREEN
MRSA, PCR: NEGATIVE
Staphylococcus aureus: POSITIVE — AB

## 2023-06-20 LAB — CBC
HCT: 45.6 % (ref 39.0–52.0)
Hemoglobin: 15.2 g/dL (ref 13.0–17.0)
MCH: 33.9 pg (ref 26.0–34.0)
MCHC: 33.3 g/dL (ref 30.0–36.0)
MCV: 101.8 fL — ABNORMAL HIGH (ref 80.0–100.0)
Platelets: 247 10*3/uL (ref 150–400)
RBC: 4.48 MIL/uL (ref 4.22–5.81)
RDW: 12.5 % (ref 11.5–15.5)
WBC: 8.2 10*3/uL (ref 4.0–10.5)
nRBC: 0 % (ref 0.0–0.2)

## 2023-06-22 ENCOUNTER — Emergency Department (HOSPITAL_COMMUNITY)

## 2023-06-22 ENCOUNTER — Emergency Department (HOSPITAL_COMMUNITY)
Admission: EM | Admit: 2023-06-22 | Discharge: 2023-06-22 | Discharge status: Against Medical Advice | Attending: Emergency Medicine | Admitting: Emergency Medicine

## 2023-06-22 ENCOUNTER — Other Ambulatory Visit: Payer: Self-pay

## 2023-06-22 DIAGNOSIS — R231 Pallor: Secondary | ICD-10-CM | POA: Diagnosis not present

## 2023-06-22 DIAGNOSIS — R42 Dizziness and giddiness: Secondary | ICD-10-CM | POA: Diagnosis not present

## 2023-06-22 DIAGNOSIS — J9811 Atelectasis: Secondary | ICD-10-CM | POA: Diagnosis not present

## 2023-06-22 DIAGNOSIS — R0989 Other specified symptoms and signs involving the circulatory and respiratory systems: Secondary | ICD-10-CM | POA: Diagnosis not present

## 2023-06-22 DIAGNOSIS — F10929 Alcohol use, unspecified with intoxication, unspecified: Secondary | ICD-10-CM | POA: Diagnosis not present

## 2023-06-22 DIAGNOSIS — Z5321 Procedure and treatment not carried out due to patient leaving prior to being seen by health care provider: Secondary | ICD-10-CM | POA: Diagnosis not present

## 2023-06-22 DIAGNOSIS — R55 Syncope and collapse: Secondary | ICD-10-CM | POA: Diagnosis not present

## 2023-06-22 LAB — BASIC METABOLIC PANEL WITH GFR
Anion gap: 12 (ref 5–15)
BUN: 18 mg/dL (ref 8–23)
CO2: 24 mmol/L (ref 22–32)
Calcium: 9.2 mg/dL (ref 8.9–10.3)
Chloride: 100 mmol/L (ref 98–111)
Creatinine, Ser: 1.2 mg/dL (ref 0.61–1.24)
GFR, Estimated: >60 mL/min (ref >60)
Glucose, Bld: 114 mg/dL — ABNORMAL HIGH (ref 70–99)
Potassium: 3.9 mmol/L (ref 3.5–5.1)
Sodium: 136 mmol/L (ref 135–145)

## 2023-06-22 LAB — CBC
HCT: 45.8 % (ref 39.0–52.0)
Hemoglobin: 15.5 g/dL (ref 13.0–17.0)
MCH: 33.3 pg (ref 26.0–34.0)
MCHC: 33.8 g/dL (ref 30.0–36.0)
MCV: 98.5 fL (ref 80.0–100.0)
Platelets: 270 10*3/uL (ref 150–400)
RBC: 4.65 MIL/uL (ref 4.22–5.81)
RDW: 12.4 % (ref 11.5–15.5)
WBC: 13.1 10*3/uL — ABNORMAL HIGH (ref 4.0–10.5)
nRBC: 0 % (ref 0.0–0.2)

## 2023-06-22 LAB — TROPONIN I (HIGH SENSITIVITY): Troponin I (High Sensitivity): 2 ng/L (ref <18)

## 2023-06-22 NOTE — ED Provider Triage Note (Signed)
 Emergency Medicine Provider Triage Evaluation Note  Jon Choi , a 73 y.o. male  was evaluated in triage.  Pt complains of LOC  Patient reports he had 2 beers and took a toke of friends' marijuana which he never does Got dizzy after and had brief LOC Feels back to normal now, denies headache  Review of Systems  Positive: LOC Negative: Headache, chest pain  Physical Exam  BP 138/77   Pulse (!) 59   Resp 17   SpO2 97%  Gen:   Awake, no distress   Resp:  Normal effort  MSK:   Moves extremities without difficulty  Other:  Ambulating steadily  Medical Decision Making  Medically screening exam initiated at 3:46 PM.  Appropriate orders placed.  Jon Choi was informed that the remainder of the evaluation will be completed by another provider, this initial triage assessment does not replace that evaluation, and the importance of remaining in the ED until their evaluation is complete.  Near syncope vs syncope eval No report or evidence of head injury, confusion, or ICH No hx or report of seizure activity today   Jon Birmingham, MD 06/22/23 1547

## 2023-06-22 NOTE — ED Triage Notes (Signed)
 BIBA from a restaurant for syncopal episode. Pt reports he had a couple beers, and hit a vape. Pt became pale, diaphoretic, and lightheaded. Pt did not sustain any injuries.  130/90 BP 157 cbg 76 HR 95% room air 18 g left forearm

## 2023-07-02 NOTE — H&P (Signed)
 TOTAL KNEE ADMISSION H&P  Patient is being admitted for right total knee arthroplasty.  Therapy Plans: outpatient therapy Disposition: Home with Planned DVT Prophylaxis: aspirin  81mg  BID DME needed: ice machine PCP: Dr. 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  HPI: Jon Choi, 73 y.o. male, has a history of pain and functional disability in the right knee due to arthritis and has failed non-surgical conservative treatments for greater than 12 weeks to includeNSAID's and/or analgesics, corticosteriod injections, and activity modification.  Onset of symptoms was gradual, starting 2 years ago with gradually worsening course since that time. The patient noted no past surgery on the right knee(s).  Patient currently rates pain in the right knee(s) at 8 out of 10 with activity. Patient has worsening of pain with activity and weight bearing and pain that interferes with activities of daily living.  Patient has evidence of joint space narrowing by imaging studies.  There is no active infection.  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   Spinal  stenosis at L4-L5 level 09/09/2016   ARF (acute renal failure) (HCC) 09/09/2016   HLD (hyperlipidemia) 06/22/2015   Hypertension 06/22/2015   Malignant melanoma (HCC) 06/22/2015   Pain in right knee 07/01/2013   Past Medical History:  Diagnosis Date   Arthritis    Back pain    History of kidney stones    Hypertension    Insomnia    Melanoma (HCC) 2010   facial--multiple excision with plastic surgery    Staph infection 2018   Started right hip    Past Surgical History:  Procedure Laterality Date   EXCISION OF BACK LESION     KNEE ARTHROSCOPY     BILATERAL   MELANOMA EXCISION     FACE +  SEVERAL SURGERIES     REIMPLANTATION OF TOTAL HIP Right 02/25/2018   Procedure: Excisional and nonexcisional debridement left hip, revision cemented antibiotic acetabulum.;  Surgeon: Claiborne Crew, MD;  Location: [facility];  Service: Orthopedics;  Laterality: Right;  120 mins   TOTAL HIP ARTHROPLASTY Right 09/12/2016   Procedure: IRRIGATION AND DEBRIDEMENT HIP WITH FEMORAL HEAD AND ACETABULAR CUP EXCHANGE;  Surgeon: Claiborne Crew, MD;  Location: [facility] OR;  Service: Orthopedics;  Laterality: Right;   TOTAL HIP ARTHROPLASTY Right 2007    No current facility-administered medications for this encounter.   Current Outpatient Medications  Medication Sig Dispense Refill Last Dose/Taking   aspirin  EC 81 MG tablet Take 81 mg by mouth at bedtime. Swallow whole.   Taking   celecoxib  (CELEBREX ) 200 MG capsule Take 200 mg by mouth every evening.   6 Taking   diclofenac (  VOLTAREN) 75 MG EC tablet Take 75 mg by mouth 2 (two) times daily as needed for moderate pain (pain score 4-6).   Taking As Needed   metoprolol  succinate (TOPROL -XL) 25 MG 24 hr tablet Take 50 mg by mouth every evening.    Taking   naproxen sodium (ALEVE) 220 MG tablet Take 440 mg by mouth 2 (two) times daily as needed (pain).   Taking As Needed   sildenafil (VIAGRA) 100 MG tablet 100 mg as needed.   Taking As Needed   simvastatin  (ZOCOR ) 20 MG  tablet Take 20 mg by mouth every evening.    Taking   temazepam  (RESTORIL ) 30 MG capsule Take 30 mg by mouth at bedtime.   0 Taking   No Known Allergies  Social History   Tobacco Use   Smoking status: Former    Types: E-cigarettes   Smokeless tobacco: Never   Tobacco comments:    quit 2015  Substance Use Topics   Alcohol use: Yes    Comment: occ    Family History  Problem Relation Age of Onset   Hypertension Other      Review of Systems  Constitutional:  Negative for chills and fever.  Respiratory:  Negative for cough and shortness of breath.   Cardiovascular:  Negative for chest pain.  Gastrointestinal:  Negative for nausea and vomiting.  Musculoskeletal:  Positive for arthralgias.     Objective:  Physical Exam Well nourished and well developed. General: Alert and oriented x3, cooperative and pleasant, no acute distress.  Musculoskeletal: Right knee exam: Today no palpable effusion, warmth or erythema Valgus right knee with tenderness over the lateral and anterior aspect knee Slight flexion contracture Passively correctable valgus with flexion over 110 degrees with tightness over the anterior lateral knee No lower extremity edema, erythema or calf tenderness  Calves soft and nontender. Motor function intact in LE. Strength 5/5 LE bilaterally. Neuro: Distal pulses 2+. Sensation to light touch intact in LE.  Vital signs in last 24 hours:    Labs:   Estimated body mass index is 24.94 kg/m as calculated from the following:   Height as of 06/20/23: 6\' 1"  (1.854 m).   Weight as of 06/20/23: 85.7 kg.   Imaging Review Plain radiographs demonstrate severe degenerative joint disease of the right knee(s). The overall alignment isneutral. The bone quality appears to be adequate for age and reported activity level.      Assessment/Plan:  End stage arthritis, right knee   The patient history, physical examination, clinical judgment of the provider and imaging  studies are consistent with end stage degenerative joint disease of the right knee(s) and total knee arthroplasty is deemed medically necessary. The treatment options including medical management, injection therapy arthroscopy and arthroplasty were discussed at length. The risks and benefits of total knee arthroplasty were presented and reviewed. The risks due to aseptic loosening, infection, stiffness, patella tracking problems, thromboembolic complications and other imponderables were discussed. The patient acknowledged the explanation, agreed to proceed with the plan and consent was signed. Patient is being admitted for inpatient treatment for surgery, pain control, PT, OT, prophylactic antibiotics, VTE prophylaxis, progressive ambulation and ADL's and discharge planning. The patient is planning to be discharged home.     Patient's anticipated LOS is less than 2 midnights, meeting these requirements: - Younger than 12 - Lives within 1 hour of care - Has a competent adult at home to recover with post-op recover - NO history of  - Chronic pain requiring  opiods  - Diabetes  - Coronary Artery Disease  - Heart failure  - Heart attack  - Stroke  - DVT/VTE  - Cardiac arrhythmia  - Respiratory Failure/COPD  - Renal failure  - Anemia  - Advanced Liver disease  Kim Pen, PA-C Orthopedic Surgery [facility] [place] [phone]

## 2023-07-03 ENCOUNTER — Other Ambulatory Visit: Payer: Self-pay

## 2023-07-03 ENCOUNTER — Ambulatory Visit (HOSPITAL_COMMUNITY): Admitting: Registered Nurse

## 2023-07-03 ENCOUNTER — Encounter (HOSPITAL_COMMUNITY): Payer: Self-pay | Admitting: Orthopedic Surgery

## 2023-07-03 ENCOUNTER — Observation Stay (HOSPITAL_COMMUNITY)
Admission: RE | Admit: 2023-07-03 | Discharge: 2023-07-04 | Disposition: A | Discharge status: Home / Self Care | Source: Ambulatory Visit | Attending: Orthopedic Surgery | Admitting: Orthopedic Surgery

## 2023-07-03 ENCOUNTER — Encounter (HOSPITAL_COMMUNITY)
Admission: RE | Disposition: A | Discharge status: Home / Self Care | Payer: Self-pay | Source: Ambulatory Visit | Attending: Orthopedic Surgery

## 2023-07-03 DIAGNOSIS — M1711 Unilateral primary osteoarthritis, right knee: Secondary | ICD-10-CM | POA: Diagnosis not present

## 2023-07-03 DIAGNOSIS — Z79899 Other long term (current) drug therapy: Secondary | ICD-10-CM | POA: Insufficient documentation

## 2023-07-03 DIAGNOSIS — G8918 Other acute postprocedural pain: Secondary | ICD-10-CM | POA: Diagnosis not present

## 2023-07-03 DIAGNOSIS — Z96641 Presence of right artificial hip joint: Secondary | ICD-10-CM | POA: Insufficient documentation

## 2023-07-03 DIAGNOSIS — I10 Essential (primary) hypertension: Secondary | ICD-10-CM | POA: Insufficient documentation

## 2023-07-03 DIAGNOSIS — Z85828 Personal history of other malignant neoplasm of skin: Secondary | ICD-10-CM | POA: Insufficient documentation

## 2023-07-03 DIAGNOSIS — Z87891 Personal history of nicotine dependence: Secondary | ICD-10-CM | POA: Diagnosis not present

## 2023-07-03 DIAGNOSIS — Z7982 Long term (current) use of aspirin: Secondary | ICD-10-CM | POA: Diagnosis not present

## 2023-07-03 DIAGNOSIS — Z96651 Presence of right artificial knee joint: Principal | ICD-10-CM

## 2023-07-03 HISTORY — PX: TOTAL KNEE ARTHROPLASTY: SHX125

## 2023-07-03 SURGERY — ARTHROPLASTY, KNEE, TOTAL
Anesthesia: Spinal | Site: Knee | Laterality: Right

## 2023-07-03 MED ORDER — BISACODYL 10 MG RE SUPP: 10.0000 mg | Freq: Every day | RECTAL | Status: DC | PRN

## 2023-07-03 MED ORDER — EPHEDRINE SULFATE-NACL 50-0.9 MG/10ML-% IV SOSY
PREFILLED_SYRINGE | INTRAVENOUS | Status: DC | PRN
  Administered 2023-07-03 (×2): 5 mg via INTRAVENOUS

## 2023-07-03 MED ORDER — NICOTINE 21 MG/24HR TD PT24
21.0000 mg | MEDICATED_PATCH | Freq: Every day | TRANSDERMAL | Status: DC
  Administered 2023-07-03 – 2023-07-04 (×2): 21 mg via TRANSDERMAL
  Filled 2023-07-03 (×2): qty 1

## 2023-07-03 MED ORDER — CHLORHEXIDINE GLUCONATE 0.12 % MT SOLN
15.0000 mL | Freq: Once | OROMUCOSAL | Status: AC
  Administered 2023-07-03: 15 mL via OROMUCOSAL

## 2023-07-03 MED ORDER — ONDANSETRON HCL 4 MG/2ML IJ SOLN: 4.0000 mg | Freq: Four times a day (QID) | INTRAMUSCULAR | Status: DC | PRN

## 2023-07-03 MED ORDER — PROPOFOL 500 MG/50ML IV EMUL
INTRAVENOUS | Status: DC | PRN
Start: 2023-07-03 — End: 2023-07-03
  Administered 2023-07-03: 100 ug/kg/min via INTRAVENOUS

## 2023-07-03 MED ORDER — METOCLOPRAMIDE HCL 5 MG/ML IJ SOLN: 5.0000 mg | Freq: Three times a day (TID) | INTRAMUSCULAR | Status: DC | PRN

## 2023-07-03 MED ORDER — 0.9 % SODIUM CHLORIDE (POUR BTL) OPTIME
TOPICAL | Status: DC | PRN
  Administered 2023-07-03: 1000 mL

## 2023-07-03 MED ORDER — ALUM & MAG HYDROXIDE-SIMETH 200-200-20 MG/5ML PO SUSP: 30.0000 mL | ORAL | Status: DC | PRN

## 2023-07-03 MED ORDER — MIDAZOLAM HCL 2 MG/2ML IJ SOLN
2.0000 mg | Freq: Once | INTRAMUSCULAR | Status: AC
  Administered 2023-07-03: 1 mg via INTRAVENOUS
  Filled 2023-07-03: qty 2

## 2023-07-03 MED ORDER — ONDANSETRON HCL 4 MG/2ML IJ SOLN
INTRAMUSCULAR | Status: DC | PRN
  Administered 2023-07-03: 4 mg via INTRAVENOUS

## 2023-07-03 MED ORDER — ONDANSETRON HCL 4 MG PO TABS: 4.0000 mg | ORAL_TABLET | Freq: Four times a day (QID) | ORAL | Status: DC | PRN

## 2023-07-03 MED ORDER — POLYETHYLENE GLYCOL 3350 17 G PO PACK
17.0000 g | PACK | Freq: Two times a day (BID) | ORAL | Status: DC
  Administered 2023-07-03 – 2023-07-04 (×2): 17 g via ORAL
  Filled 2023-07-03 (×2): qty 1

## 2023-07-03 MED ORDER — MEPERIDINE HCL 50 MG/ML IJ SOLN: 6.2500 mg | INTRAMUSCULAR | Status: DC | PRN

## 2023-07-03 MED ORDER — FENTANYL CITRATE PF 50 MCG/ML IJ SOSY
100.0000 ug | PREFILLED_SYRINGE | Freq: Once | INTRAMUSCULAR | Status: AC
  Administered 2023-07-03: 50 ug via INTRAVENOUS
  Filled 2023-07-03: qty 2

## 2023-07-03 MED ORDER — ASPIRIN 81 MG PO CHEW
81.0000 mg | CHEWABLE_TABLET | Freq: Two times a day (BID) | ORAL | Status: DC
  Administered 2023-07-03 – 2023-07-04 (×2): 81 mg via ORAL
  Filled 2023-07-03 (×2): qty 1

## 2023-07-03 MED ORDER — METOPROLOL SUCCINATE ER 50 MG PO TB24
50.0000 mg | ORAL_TABLET | Freq: Every evening | ORAL | Status: DC
  Administered 2023-07-03: 50 mg via ORAL
  Filled 2023-07-03: qty 1

## 2023-07-03 MED ORDER — KETOROLAC TROMETHAMINE 30 MG/ML IJ SOLN
INTRAMUSCULAR | Status: AC
  Filled 2023-07-03: qty 1

## 2023-07-03 MED ORDER — BUPIVACAINE IN DEXTROSE 0.75-8.25 % IT SOLN
INTRATHECAL | Status: DC | PRN
  Administered 2023-07-03: 2 mL via INTRATHECAL

## 2023-07-03 MED ORDER — TEMAZEPAM 15 MG PO CAPS
30.0000 mg | ORAL_CAPSULE | Freq: Every day | ORAL | Status: DC
  Administered 2023-07-03: 30 mg via ORAL
  Filled 2023-07-03: qty 2

## 2023-07-03 MED ORDER — TRANEXAMIC ACID-NACL 1000-0.7 MG/100ML-% IV SOLN
1000.0000 mg | Freq: Once | INTRAVENOUS | Status: AC
  Administered 2023-07-03: 1000 mg via INTRAVENOUS
  Filled 2023-07-03: qty 100

## 2023-07-03 MED ORDER — ORAL CARE MOUTH RINSE: 15.0000 mL | OROMUCOSAL | Status: DC | PRN

## 2023-07-03 MED ORDER — PROPOFOL 10 MG/ML IV BOLUS
INTRAVENOUS | Status: DC | PRN
  Administered 2023-07-03 (×2): 20 mg via INTRAVENOUS

## 2023-07-03 MED ORDER — CEFAZOLIN SODIUM-DEXTROSE 2-4 GM/100ML-% IV SOLN
2.0000 g | INTRAVENOUS | Status: AC
  Administered 2023-07-03: 2 g via INTRAVENOUS
  Filled 2023-07-03: qty 100

## 2023-07-03 MED ORDER — STERILE WATER FOR IRRIGATION IR SOLN
Status: DC | PRN
  Administered 2023-07-03: 1000 mL

## 2023-07-03 MED ORDER — EPHEDRINE 5 MG/ML INJ
INTRAVENOUS | Status: AC
  Filled 2023-07-03: qty 5

## 2023-07-03 MED ORDER — OXYCODONE HCL 5 MG/5ML PO SOLN: 5.0000 mg | Freq: Once | ORAL | Status: DC | PRN

## 2023-07-03 MED ORDER — SIMVASTATIN 20 MG PO TABS
20.0000 mg | ORAL_TABLET | Freq: Every evening | ORAL | Status: DC
  Administered 2023-07-03: 20 mg via ORAL
  Filled 2023-07-03: qty 1

## 2023-07-03 MED ORDER — PROPOFOL 1000 MG/100ML IV EMUL
INTRAVENOUS | Status: AC
  Filled 2023-07-03: qty 100

## 2023-07-03 MED ORDER — LIDOCAINE 2% (20 MG/ML) 5 ML SYRINGE
INTRAMUSCULAR | Status: DC | PRN
  Administered 2023-07-03: 30 mg via INTRAVENOUS

## 2023-07-03 MED ORDER — CELECOXIB 200 MG PO CAPS
200.0000 mg | ORAL_CAPSULE | Freq: Two times a day (BID) | ORAL | Status: DC
  Administered 2023-07-03 – 2023-07-04 (×2): 200 mg via ORAL
  Filled 2023-07-03 (×2): qty 1

## 2023-07-03 MED ORDER — CHLORHEXIDINE GLUCONATE 4 % EX SOLN
1.0000 | CUTANEOUS | 1 refills | Status: AC
Start: 2023-07-03 — End: ?

## 2023-07-03 MED ORDER — BUPIVACAINE-EPINEPHRINE (PF) 0.25% -1:200000 IJ SOLN
INTRAMUSCULAR | Status: AC
  Filled 2023-07-03: qty 30

## 2023-07-03 MED ORDER — METOCLOPRAMIDE HCL 5 MG PO TABS: 5.0000 mg | ORAL_TABLET | Freq: Three times a day (TID) | ORAL | Status: DC | PRN

## 2023-07-03 MED ORDER — HYDROMORPHONE HCL 1 MG/ML IJ SOLN: 0.2500 mg | INTRAMUSCULAR | Status: DC | PRN

## 2023-07-03 MED ORDER — DEXAMETHASONE SODIUM PHOSPHATE 10 MG/ML IJ SOLN
8.0000 mg | Freq: Once | INTRAMUSCULAR | Status: AC
  Administered 2023-07-03: 8 mg via INTRAVENOUS

## 2023-07-03 MED ORDER — MUPIROCIN 2 % EX OINT: 1.0000 | TOPICAL_OINTMENT | Freq: Two times a day (BID) | CUTANEOUS | 0 refills | Status: AC

## 2023-07-03 MED ORDER — PHENOL 1.4 % MT LIQD: 1.0000 | OROMUCOSAL | Status: DC | PRN

## 2023-07-03 MED ORDER — METHOCARBAMOL 1000 MG/10ML IJ SOLN: 500.0000 mg | Freq: Four times a day (QID) | INTRAMUSCULAR | Status: DC | PRN

## 2023-07-03 MED ORDER — ACETAMINOPHEN 500 MG PO TABS
1000.0000 mg | ORAL_TABLET | Freq: Once | ORAL | Status: AC
  Administered 2023-07-03: 1000 mg via ORAL
  Filled 2023-07-03: qty 2

## 2023-07-03 MED ORDER — POVIDONE-IODINE 10 % EX SWAB: 2.0000 | Freq: Once | CUTANEOUS | Status: DC

## 2023-07-03 MED ORDER — HYDROMORPHONE HCL 1 MG/ML IJ SOLN: 0.5000 mg | INTRAMUSCULAR | Status: DC | PRN

## 2023-07-03 MED ORDER — SODIUM CHLORIDE 0.9 % IR SOLN
Status: DC | PRN
  Administered 2023-07-03: 1000 mL

## 2023-07-03 MED ORDER — TRANEXAMIC ACID-NACL 1000-0.7 MG/100ML-% IV SOLN
1000.0000 mg | INTRAVENOUS | Status: AC
  Administered 2023-07-03: 1000 mg via INTRAVENOUS
  Filled 2023-07-03: qty 100

## 2023-07-03 MED ORDER — PHENYLEPHRINE HCL-NACL 20-0.9 MG/250ML-% IV SOLN
INTRAVENOUS | Status: DC | PRN
  Administered 2023-07-03: 30 ug/min via INTRAVENOUS

## 2023-07-03 MED ORDER — MENTHOL 3 MG MT LOZG: 1.0000 | LOZENGE | OROMUCOSAL | Status: DC | PRN

## 2023-07-03 MED ORDER — BUPIVACAINE-EPINEPHRINE (PF) 0.25% -1:200000 IJ SOLN
INTRAMUSCULAR | Status: DC | PRN
  Administered 2023-07-03: 30 mL via PERINEURAL

## 2023-07-03 MED ORDER — DIPHENHYDRAMINE HCL 12.5 MG/5ML PO ELIX: 12.5000 mg | ORAL_SOLUTION | ORAL | Status: DC | PRN

## 2023-07-03 MED ORDER — KETOROLAC TROMETHAMINE 30 MG/ML IJ SOLN
INTRAMUSCULAR | Status: DC | PRN
  Administered 2023-07-03: 30 mg via INTRAVENOUS

## 2023-07-03 MED ORDER — SODIUM CHLORIDE 0.9% FLUSH: 3.0000 mL | INTRAVENOUS | Status: DC | PRN

## 2023-07-03 MED ORDER — SODIUM CHLORIDE (PF) 0.9 % IJ SOLN
INTRAMUSCULAR | Status: AC
  Filled 2023-07-03: qty 30

## 2023-07-03 MED ORDER — SODIUM CHLORIDE (PF) 0.9 % IJ SOLN
INTRAMUSCULAR | Status: DC | PRN
  Administered 2023-07-03: 30 mL via INTRAVENOUS

## 2023-07-03 MED ORDER — MIDAZOLAM HCL 2 MG/2ML IJ SOLN: 0.5000 mg | Freq: Once | INTRAMUSCULAR | Status: DC | PRN

## 2023-07-03 MED ORDER — ONDANSETRON HCL 4 MG/2ML IJ SOLN
INTRAMUSCULAR | Status: AC
  Filled 2023-07-03: qty 2

## 2023-07-03 MED ORDER — LACTATED RINGERS IV SOLN: INTRAVENOUS | Status: DC

## 2023-07-03 MED ORDER — ACETAMINOPHEN 500 MG PO TABS
1000.0000 mg | ORAL_TABLET | Freq: Four times a day (QID) | ORAL | Status: DC
  Administered 2023-07-03 – 2023-07-04 (×3): 1000 mg via ORAL
  Filled 2023-07-03 (×3): qty 2

## 2023-07-03 MED ORDER — ORAL CARE MOUTH RINSE: 15.0000 mL | Freq: Once | OROMUCOSAL | Status: AC

## 2023-07-03 MED ORDER — DEXAMETHASONE SODIUM PHOSPHATE 10 MG/ML IJ SOLN
10.0000 mg | Freq: Once | INTRAMUSCULAR | Status: AC
Start: 2023-07-04 — End: 2023-07-04
  Administered 2023-07-04: 10 mg via INTRAVENOUS
  Filled 2023-07-03: qty 1

## 2023-07-03 MED ORDER — LIDOCAINE HCL (PF) 2 % IJ SOLN
INTRAMUSCULAR | Status: AC
  Filled 2023-07-03: qty 5

## 2023-07-03 MED ORDER — SODIUM CHLORIDE 0.9% FLUSH
3.0000 mL | Freq: Two times a day (BID) | INTRAVENOUS | Status: DC
  Administered 2023-07-04: 5 mL via INTRAVENOUS

## 2023-07-03 MED ORDER — SENNA 8.6 MG PO TABS
2.0000 | ORAL_TABLET | Freq: Every day | ORAL | Status: DC
  Administered 2023-07-03: 17.2 mg via ORAL
  Filled 2023-07-03: qty 2

## 2023-07-03 MED ORDER — ROPIVACAINE HCL 7.5 MG/ML IJ SOLN
INTRAMUSCULAR | Status: DC | PRN
  Administered 2023-07-03: 20 mL via PERINEURAL

## 2023-07-03 MED ORDER — DEXAMETHASONE SODIUM PHOSPHATE 10 MG/ML IJ SOLN
INTRAMUSCULAR | Status: AC
  Filled 2023-07-03: qty 1

## 2023-07-03 MED ORDER — CEFAZOLIN SODIUM-DEXTROSE 2-4 GM/100ML-% IV SOLN
2.0000 g | Freq: Four times a day (QID) | INTRAVENOUS | Status: AC
  Administered 2023-07-03 – 2023-07-04 (×2): 2 g via INTRAVENOUS
  Filled 2023-07-03 (×2): qty 100

## 2023-07-03 MED ORDER — METHOCARBAMOL 500 MG PO TABS: 500.0000 mg | ORAL_TABLET | Freq: Four times a day (QID) | ORAL | Status: DC | PRN

## 2023-07-03 MED ORDER — OXYCODONE HCL 5 MG PO TABS: 10.0000 mg | ORAL_TABLET | ORAL | Status: DC | PRN

## 2023-07-03 MED ORDER — OXYCODONE HCL 5 MG PO TABS
5.0000 mg | ORAL_TABLET | ORAL | Status: DC | PRN
  Administered 2023-07-03 – 2023-07-04 (×2): 5 mg via ORAL
  Filled 2023-07-03 (×2): qty 1

## 2023-07-03 MED ORDER — OXYCODONE HCL 5 MG PO TABS: 5.0000 mg | ORAL_TABLET | Freq: Once | ORAL | Status: DC | PRN

## 2023-07-03 SURGICAL SUPPLY — 47 items
ATTUNE MED ANAT PAT 38 KNEE (Knees) IMPLANT
ATTUNE PS FEM RT SZ 6 CEM KNEE (Femur) IMPLANT
ATTUNE PSRP INSR SZ6 6 KNEE (Insert) IMPLANT
BAG COUNTER SPONGE SURGICOUNT (BAG) IMPLANT
BAG ZIPLOCK 12X15 (MISCELLANEOUS) ×1 IMPLANT
BASE TIBIAL ROT PLAT SZ 8 KNEE (Knees) IMPLANT
BLADE SAW SGTL 13.0X1.19X90.0M (BLADE) ×1 IMPLANT
BNDG ELASTIC 6INX 5YD STR LF (GAUZE/BANDAGES/DRESSINGS) ×1 IMPLANT
BOWL SMART MIX CTS (DISPOSABLE) ×1 IMPLANT
CEMENT HV SMART SET (Cement) ×2 IMPLANT
COOLER ICEMAN CLASSIC (MISCELLANEOUS) IMPLANT
COVER SURGICAL LIGHT HANDLE (MISCELLANEOUS) ×1 IMPLANT
CUFF TRNQT CYL 34X4.125X (TOURNIQUET CUFF) ×1 IMPLANT
DERMABOND ADVANCED .7 DNX12 (GAUZE/BANDAGES/DRESSINGS) ×1 IMPLANT
DRAPE U-SHAPE 47X51 STRL (DRAPES) ×1 IMPLANT
DRESSING AQUACEL AG SP 3.5X10 (GAUZE/BANDAGES/DRESSINGS) ×1 IMPLANT
DRSG AQUACEL AG ADV 3.5X14 (GAUZE/BANDAGES/DRESSINGS) IMPLANT
DURAPREP 26ML APPLICATOR (WOUND CARE) ×2 IMPLANT
ELECT REM PT RETURN 15FT ADLT (MISCELLANEOUS) ×1 IMPLANT
GLOVE BIO SURGEON STRL SZ 6 (GLOVE) ×1 IMPLANT
GLOVE BIOGEL PI IND STRL 6.5 (GLOVE) ×1 IMPLANT
GLOVE BIOGEL PI IND STRL 7.5 (GLOVE) ×1 IMPLANT
GLOVE ORTHO TXT STRL SZ7.5 (GLOVE) ×2 IMPLANT
GOWN STRL REUS W/ TWL LRG LVL3 (GOWN DISPOSABLE) ×2 IMPLANT
HOLDER FOLEY CATH W/STRAP (MISCELLANEOUS) IMPLANT
KIT TURNOVER KIT A (KITS) ×1 IMPLANT
MANIFOLD NEPTUNE II (INSTRUMENTS) ×1 IMPLANT
NDL SAFETY ECLIPSE 18X1.5 (NEEDLE) IMPLANT
NS IRRIG 1000ML POUR BTL (IV SOLUTION) ×1 IMPLANT
PACK TOTAL KNEE CUSTOM (KITS) ×1 IMPLANT
PAD COLD SHLDR WRAP-ON (PAD) IMPLANT
PENCIL SMOKE EVACUATOR (MISCELLANEOUS) ×1 IMPLANT
PIN FIX SIGMA LCS THRD HI (PIN) IMPLANT
PROTECTOR NERVE ULNAR (MISCELLANEOUS) ×1 IMPLANT
SET HNDPC FAN SPRY TIP SCT (DISPOSABLE) ×1 IMPLANT
SET PAD KNEE POSITIONER (MISCELLANEOUS) ×1 IMPLANT
SPIKE FLUID TRANSFER (MISCELLANEOUS) ×2 IMPLANT
SUT MNCRL AB 4-0 PS2 18 (SUTURE) ×1 IMPLANT
SUT STRATAFIX PDS+ 0 24IN (SUTURE) ×1 IMPLANT
SUT VIC AB 1 CT1 36 (SUTURE) ×1 IMPLANT
SUT VIC AB 2-0 CT1 TAPERPNT 27 (SUTURE) ×2 IMPLANT
SYR 3ML LL SCALE MARK (SYRINGE) ×1 IMPLANT
TOWEL GREEN STERILE FF (TOWEL DISPOSABLE) ×1 IMPLANT
TRAY FOLEY MTR SLVR 16FR STAT (SET/KITS/TRAYS/PACK) ×1 IMPLANT
TUBE SUCTION HIGH CAP CLEAR NV (SUCTIONS) ×1 IMPLANT
WATER STERILE IRR 1000ML POUR (IV SOLUTION) ×2 IMPLANT
WRAP KNEE MAXI GEL POST OP (GAUZE/BANDAGES/DRESSINGS) ×1 IMPLANT

## 2023-07-03 NOTE — Interval H&P Note (Signed)
 History and Physical Interval Note:  07/03/2023 11:07 AM  Jon Choi  has presented today for surgery, with the diagnosis of Right knee osteoarthritis.  The various methods of treatment have been discussed with the patient and family. After consideration of risks, benefits and other options for treatment, the patient has consented to  Procedure(s): ARTHROPLASTY, KNEE, TOTAL (Right) as a surgical intervention.  The patient's history has been reviewed, patient examined, no change in status, stable for surgery.  I have reviewed the patient's chart and labs.  Questions were answered to the patient's satisfaction.     Bevin Bucks

## 2023-07-03 NOTE — Anesthesia Preprocedure Evaluation (Addendum)
 Anesthesia Evaluation  Patient identified by MRN, date of birth, ID band Patient awake    Reviewed: Allergy & Precautions, NPO status , Patient's Chart, lab work & pertinent test results, reviewed documented beta blocker date and time   History of Anesthesia Complications Negative for: history of anesthetic complications  Airway Mallampati: II  TM Distance: >3 FB Neck ROM: Full    Dental  (+) Caps, Dental Advisory Given   Pulmonary former smoker   breath sounds clear to auscultation       Cardiovascular hypertension, Pt. on medications and Pt. on home beta blockers (-) angina  Rhythm:Regular Rate:Normal     Neuro/Psych negative neurological ROS     GI/Hepatic negative GI ROS, Neg liver ROS,,,  Endo/Other  negative endocrine ROS    Renal/GU negative Renal ROS     Musculoskeletal  (+) Arthritis , Osteoarthritis,    Abdominal   Peds  Hematology Hb 15.5, plt 270k   Anesthesia Other Findings   Reproductive/Obstetrics                             Anesthesia Physical Anesthesia Plan  ASA: 2  Anesthesia Plan: Spinal   Post-op Pain Management: Tylenol  PO (pre-op)* and Regional block*   Induction:   PONV Risk Score and Plan: 1 and Ondansetron  and Dexamethasone   Airway Management Planned: Natural Airway and Simple Face Mask  Additional Equipment: None  Intra-op Plan:   Post-operative Plan:   Informed Consent: I have reviewed the patients History and Physical, chart, labs and discussed the procedure including the risks, benefits and alternatives for the proposed anesthesia with the patient or authorized representative who has indicated his/her understanding and acceptance.     Dental advisory given  Plan Discussed with: CRNA and Surgeon  Anesthesia Plan Comments: (Plan routine monitors, SAB with adductor canal block for post op analgesia)        Anesthesia Quick  Evaluation

## 2023-07-03 NOTE — Discharge Instructions (Signed)

## 2023-07-03 NOTE — Op Note (Signed)
 NAME:  Jon Choi                      MEDICAL RECORD NO.:  [id number]                             FACILITY:  [facility]      PHYSICIAN:  Azalea Lento. Bernard Brick, M.D.  DATE OF BIRTH:  1951-01-20      DATE OF PROCEDURE:  07/03/2023                                     OPERATIVE REPORT         PREOPERATIVE DIAGNOSIS:  Right knee osteoarthritis.      POSTOPERATIVE DIAGNOSIS:  Right knee osteoarthritis.      FINDINGS:  The patient was noted to have complete loss of cartilage and   bone-on-bone arthritis with associated osteophytes in the lateral and patellofemoral compartments of   the knee with a significant synovitis and associated effusion.  The patient had failed months of conservative treatment including medications, injection therapy, activity modification.     PROCEDURE:  Right total knee replacement.      COMPONENTS USED:  DePuy Attune rotating platform posterior stabilized knee   system, a size 6 femur, 8 tibia, size 6 mm PS AOX insert, and 38 anatomic patellar   button.      SURGEON:  Azalea Lento. Bernard Brick, M.D.      ASSISTANT:  Kim Pen, PA-C.      ANESTHESIA:  Regional and Spinal.      SPECIMENS:  None.      COMPLICATION:  None.      DRAINS:  None.  EBL: <200 cc      TOURNIQUET TIME:  34 min at 225 mmHg     The patient was stable to the recovery room.      INDICATION FOR PROCEDURE:  [name] is a 73 y.o. male patient of   mine.  The patient had been seen, evaluated, and treated for months conservatively in the   office with medication, activity modification, and injections.  The patient had   radiographic changes of bone-on-bone arthritis with endplate sclerosis and osteophytes noted.  Based on the radiographic changes and failed conservative measures, the patient   decided to proceed with definitive treatment, total knee replacement.  Risks of infection, DVT, component failure, need for revision surgery, neurovascular injury were reviewed in the office setting.   The postop course was reviewed stressing the efforts to maximize post-operative satisfaction and function.  Consent was obtained for benefit of pain   relief.      PROCEDURE IN DETAIL:  The patient was brought to the operative theater.   Once adequate anesthesia, preoperative antibiotics, 2 gm of Ancef ,1 gm of Tranexamic Acid , and 10 mg of Decadron  administered, the patient was positioned supine with a right thigh tourniquet placed.  The  right lower extremity was prepped and draped in sterile fashion.  A time-   out was performed identifying the patient, planned procedure, and the appropriate extremity.      The right lower extremity was placed in the [facility] leg holder.  The leg was   exsanguinated, tourniquet elevated to 225 mmHg.  A midline incision was   made followed by median parapatellar arthrotomy.  Following initial   exposure, attention was  first directed to the patella.  Precut   measurement was noted to be 26 mm.  I resected down to 14 mm and used a   38 anatomic patellar button to restore patellar height as well as cover the cut surface.      The lug holes were drilled and a metal shim was placed to protect the   patella from retractors and saw blade during the procedure.      At this point, attention was now directed to the femur.  The femoral   canal was opened with a drill, irrigated to try to prevent fat emboli.  An   intramedullary rod was passed at 5 degrees valgus, 9 mm of bone was   resected off the distal femur.  Following this resection, the tibia was   subluxated anteriorly.  Using the extramedullary guide, 2 mm of bone was resected off   the proximal lateral tibia.  We confirmed the gap would be   stable medially and laterally with a size 5 spacer block as well as confirmed that the tibial cut was perpendicular in the coronal plane, checking with an alignment rod.      Once this was done, I sized the femur to be a size 6 in the anterior-   posterior dimension,  chose a standard component based on medial and   lateral dimension.  The size 6 rotation block was then pinned in   position anterior referenced using the C-clamp to set rotation.  The   anterior, posterior, and  chamfer cuts were made without difficulty nor   notching making certain that I was along the anterior cortex to help   with flexion gap stability.      The final box cut was made off the lateral aspect of distal femur.      At this point, the tibia was sized to be a size 8.  The size 8 tray was   then pinned in position through the medial third of the tubercle,   drilled, and keel punched.  Trial reduction was now carried with a 6 femur,  8 tibia, a size 6 mm PS insert, and the 38 anatomic patella botton.  The knee was brought to full extension with good flexion stability with the patella   tracking through the trochlea without application of pressure.  Given   all these findings the trial components removed.  Final components were   opened and cement was mixed.  The knee was irrigated with normal saline solution and pulse lavage.  The synovial lining was   then injected with 30 cc of 0.25% Marcaine  with epinephrine , 1 cc of Toradol  and 30 cc of NS for a total of 61 cc.     Final implants were then cemented onto cleaned and dried cut surfaces of bone with the knee brought to extension with a size 6 mm PS trial insert.      Once the cement had fully cured, excess cement was removed   throughout the knee.  I confirmed that I was satisfied with the range of   motion and stability, and the final size 6 mm PS AOX insert was chosen.  It was   placed into the knee.      The tourniquet had been let down at 34 minutes.  No significant   hemostasis was required.  The extensor mechanism was then reapproximated using #1 Vicryl and #1 Stratafix sutures with the knee   in flexion.  The  remaining wound was closed with 2-0 Vicryl and running 4-0 Monocryl.   The knee was cleaned, dried,  dressed sterilely using Dermabond and   Aquacel dressing.  The patient was then   brought to recovery room in stable condition, tolerating the procedure   well.   Please note that Physician Assistant, Kim Pen, PA-C was present for the entirety of the case, and was utilized for pre-operative positioning, peri-operative retractor management, general facilitation of the procedure and for primary wound closure at the end of the case.              Azalea Lento Bernard Brick, M.D.    07/03/2023 11:07 AM

## 2023-07-03 NOTE — Anesthesia Procedure Notes (Signed)
 Spinal  Patient location during procedure: OR End time: 07/03/2023 12:24 PM Reason for block: surgical anesthesia Staffing Performed: resident/CRNA  Resident/CRNA: Jonnie Nettles, CRNA Performed by: Analayah Brooke, Clinical cytogeneticist D, CRNA Authorized by: Jonne Netters, MD   Preanesthetic Checklist Completed: patient identified, IV checked, site marked, risks and benefits discussed, surgical consent, monitors and equipment checked, pre-op evaluation and timeout performed Spinal Block Patient position: sitting Prep: DuraPrep Patient monitoring: heart rate, continuous pulse ox and blood pressure Approach: midline Location: L3-4 Injection technique: single-shot Needle Needle type: Pencan  Needle gauge: 24 G Needle length: 9 cm Assessment Sensory level: T6 Events: CSF return

## 2023-07-03 NOTE — Transfer of Care (Signed)
 Immediate Anesthesia Transfer of Care Note  Patient: Jon Choi  Procedure(s) Performed: ARTHROPLASTY, KNEE, TOTAL (Right: Knee)  Patient Location: PACU  Anesthesia Type:Regional and Spinal  Level of Consciousness: awake, alert , and oriented  Airway & Oxygen Therapy: Patient Spontanous Breathing and Patient connected to face mask oxygen  Post-op Assessment: Report given to RN and Post -op Vital signs reviewed and stable  Post vital signs: Reviewed and stable  Last Vitals:  Vitals Value Taken Time  BP 102/80 07/03/23 1410  Temp    Pulse 79 07/03/23 1412  Resp 16 07/03/23 1412  SpO2 97 % 07/03/23 1412  Vitals shown include unfiled device data.  Last Pain:  Vitals:   07/03/23 1208  TempSrc:   PainSc: 0-No pain         Complications: No notable events documented.

## 2023-07-03 NOTE — Anesthesia Procedure Notes (Signed)
 Anesthesia Regional Block: Adductor canal block   Pre-Anesthetic Checklist: , timeout performed,  Correct Patient, Correct Site, Correct Laterality,  Correct Procedure, Correct Position, site marked,  Risks and benefits discussed,  Surgical consent,  Pre-op evaluation,  At surgeon's request and post-op pain management  Laterality: Right and Lower  Prep: chloraprep       Needles:  Injection technique: Single-shot  Needle Type: Echogenic Needle     Needle Length: 9cm  Needle Gauge: 21     Additional Needles:   Procedures:,,,, ultrasound used (permanent image in chart),,    Narrative:  Start time: 07/03/2023 11:48 AM End time: 07/03/2023 11:54 AM Injection made incrementally with aspirations every 5 mL.  Performed by: Personally  Anesthesiologist: Jonne Netters, MD  Additional Notes: Pt identified in Holding room.  Monitors applied. Working IV access confirmed. Timeout, Sterile prep R thigh.  #21ga ECHOgenic Arrow block needle into adductor canal with US  guidance.  20cc 0.75% Ropivacaine injected incrementally after negative test dose.  Patient asymptomatic, VSS, no heme aspirated, tolerated well.   Fay Hoop, MD

## 2023-07-03 NOTE — Anesthesia Postprocedure Evaluation (Signed)
 Anesthesia Post Note  Patient: Jon Choi  Procedure(s) Performed: ARTHROPLASTY, KNEE, TOTAL (Right: Knee)     Patient location during evaluation: PACU Anesthesia Type: Spinal Level of consciousness: oriented, awake and alert and patient cooperative Pain management: pain level controlled Vital Signs Assessment: post-procedure vital signs reviewed and stable Respiratory status: spontaneous breathing, respiratory function stable and nonlabored ventilation Cardiovascular status: blood pressure returned to baseline and stable Postop Assessment: no headache, no backache, no apparent nausea or vomiting and patient able to bend at knees Anesthetic complications: no   No notable events documented.  Last Vitals:  Vitals:   07/03/23 1430 07/03/23 1445  BP: 110/87 116/79  Pulse: 73 71  Resp: 17 (!) 23  Temp:    SpO2: 97% 93%    Last Pain:  Vitals:   07/03/23 1445  TempSrc:   PainSc: 0-No pain                 Aydden Cumpian,E. Urijah Raynor

## 2023-07-03 NOTE — Plan of Care (Signed)
  Problem: Health Behavior/Discharge Planning: Goal: Ability to manage health-related needs will improve Outcome: Progressing   Problem: Clinical Measurements: Goal: Ability to maintain clinical measurements within normal limits will improve Outcome: Progressing Goal: Will remain free from infection Outcome: Progressing Goal: Diagnostic test results will improve Outcome: Progressing Goal: Respiratory complications will improve Outcome: Progressing Goal: Cardiovascular complication will be avoided Outcome: Progressing   Problem: Activity: Goal: Risk for activity intolerance will decrease Outcome: Progressing   Problem: Nutrition: Goal: Adequate nutrition will be maintained Outcome: Progressing   Problem: Coping: Goal: Level of anxiety will decrease Outcome: Progressing   Problem: Elimination: Goal: Will not experience complications related to bowel motility Outcome: Progressing Goal: Will not experience complications related to urinary retention Outcome: Progressing   Problem: Pain Managment: Goal: General experience of comfort will improve and/or be controlled Outcome: Progressing   Problem: Safety: Goal: Ability to remain free from injury will improve Outcome: Progressing   Problem: Skin Integrity: Goal: Risk for impaired skin integrity will decrease Outcome: Progressing   Problem: Education: Goal: Knowledge of the prescribed therapeutic regimen will improve Outcome: Progressing   Problem: Activity: Goal: Ability to avoid complications of mobility impairment will improve Outcome: Progressing Goal: Range of joint motion will improve Outcome: Progressing   Problem: Clinical Measurements: Goal: Postoperative complications will be avoided or minimized Outcome: Progressing   Problem: Pain Management: Goal: Pain level will decrease with appropriate interventions Outcome: Progressing   Problem: Skin Integrity: Goal: Will show signs of wound healing Outcome:  Progressing

## 2023-07-04 ENCOUNTER — Encounter (HOSPITAL_COMMUNITY): Payer: Self-pay | Admitting: Orthopedic Surgery

## 2023-07-04 DIAGNOSIS — M1711 Unilateral primary osteoarthritis, right knee: Secondary | ICD-10-CM | POA: Diagnosis not present

## 2023-07-04 DIAGNOSIS — Z87891 Personal history of nicotine dependence: Secondary | ICD-10-CM | POA: Diagnosis not present

## 2023-07-04 DIAGNOSIS — Z85828 Personal history of other malignant neoplasm of skin: Secondary | ICD-10-CM | POA: Diagnosis not present

## 2023-07-04 DIAGNOSIS — Z96641 Presence of right artificial hip joint: Secondary | ICD-10-CM | POA: Diagnosis not present

## 2023-07-04 DIAGNOSIS — Z79899 Other long term (current) drug therapy: Secondary | ICD-10-CM | POA: Diagnosis not present

## 2023-07-04 DIAGNOSIS — I10 Essential (primary) hypertension: Secondary | ICD-10-CM | POA: Diagnosis not present

## 2023-07-04 DIAGNOSIS — Z7982 Long term (current) use of aspirin: Secondary | ICD-10-CM | POA: Diagnosis not present

## 2023-07-04 LAB — CBC
HCT: 36.2 % — ABNORMAL LOW (ref 39.0–52.0)
Hemoglobin: 11.9 g/dL — ABNORMAL LOW (ref 13.0–17.0)
MCH: 33.5 pg (ref 26.0–34.0)
MCHC: 32.9 g/dL (ref 30.0–36.0)
MCV: 102 fL — ABNORMAL HIGH (ref 80.0–100.0)
Platelets: 279 10*3/uL (ref 150–400)
RBC: 3.55 MIL/uL — ABNORMAL LOW (ref 4.22–5.81)
RDW: 11.9 % (ref 11.5–15.5)
WBC: 13.9 10*3/uL — ABNORMAL HIGH (ref 4.0–10.5)
nRBC: 0 % (ref 0.0–0.2)

## 2023-07-04 LAB — BASIC METABOLIC PANEL WITH GFR
Anion gap: 9 (ref 5–15)
BUN: 16 mg/dL (ref 8–23)
CO2: 21 mmol/L — ABNORMAL LOW (ref 22–32)
Calcium: 8.5 mg/dL — ABNORMAL LOW (ref 8.9–10.3)
Chloride: 104 mmol/L (ref 98–111)
Creatinine, Ser: 0.95 mg/dL (ref 0.61–1.24)
GFR, Estimated: >60 mL/min (ref >60)
Glucose, Bld: 133 mg/dL — ABNORMAL HIGH (ref 70–99)
Potassium: 4.3 mmol/L (ref 3.5–5.1)
Sodium: 134 mmol/L — ABNORMAL LOW (ref 135–145)

## 2023-07-04 MED ORDER — POLYETHYLENE GLYCOL 3350 17 G PO PACK: 17.0000 g | PACK | Freq: Two times a day (BID) | ORAL | 0 refills | Status: AC

## 2023-07-04 MED ORDER — SENNA 8.6 MG PO TABS: 2.0000 | ORAL_TABLET | Freq: Every day | ORAL | 0 refills | Status: AC

## 2023-07-04 MED ORDER — CEFADROXIL 500 MG PO CAPS: 500.0000 mg | ORAL_CAPSULE | Freq: Two times a day (BID) | ORAL | 0 refills | Status: AC

## 2023-07-04 MED ORDER — OXYCODONE HCL 5 MG PO TABS: 5.0000 mg | ORAL_TABLET | ORAL | 0 refills | Status: AC | PRN

## 2023-07-04 MED ORDER — METHOCARBAMOL 500 MG PO TABS: 500.0000 mg | ORAL_TABLET | Freq: Four times a day (QID) | ORAL | 2 refills | Status: AC | PRN

## 2023-07-04 MED ORDER — ASPIRIN 81 MG PO CHEW: 81.0000 mg | CHEWABLE_TABLET | Freq: Two times a day (BID) | ORAL | 0 refills | Status: AC

## 2023-07-04 NOTE — Progress Notes (Signed)
   Subjective: 1 Day Post-Op Procedure(s) (LRB): ARTHROPLASTY, KNEE, TOTAL (Right) Patient reports pain as mild.   Patient seen in rounds with Dr. Bernard Brick. Patient is well, and has had no acute complaints or problems. No acute events overnight. Foley catheter removed. Patient has not been up with PT yet.  We will start therapy today.   Objective: Vital signs in last 24 hours: Temp:  [97.1 F (36.2 C)-98.3 F (36.8 C)] 97.7 F (36.5 C) (05/21 0531) Pulse Rate:  [49-75] 65 (05/21 0531) Resp:  [13-23] 17 (05/21 0531) BP: (108-154)/(72-91) 138/73 (05/21 0531) SpO2:  [93 %-99 %] 97 % (05/21 0531) Weight:  [85.7 kg] 85.7 kg (05/20 1047)  Intake/Output from previous day:  Intake/Output Summary (Last 24 hours) at 07/04/2023 0754 Last data filed at 07/04/2023 0620 Gross per 24 hour  Intake 2650 ml  Output 750 ml  Net 1900 ml     Intake/Output this shift: No intake/output data recorded.  Labs: Recent Labs    07/04/23 0349  HGB 11.9*   Recent Labs    07/04/23 0349  WBC 13.9*  RBC 3.55*  HCT 36.2*  PLT 279   Recent Labs    07/04/23 0349  NA 134*  K 4.3  CL 104  CO2 21*  BUN 16  CREATININE 0.95  GLUCOSE 133*  CALCIUM  8.5*   No results for input(s): "LABPT", "INR" in the last 72 hours.  Exam: General - Patient is Alert and Oriented Extremity - Neurologically intact Sensation intact distally Intact pulses distally Dorsiflexion/Plantar flexion intact Dressing - dressing C/D/I Motor Function - intact, moving foot and toes well on exam.   Past Medical History:  Diagnosis Date   Arthritis    Back pain    History of kidney stones    Hypertension    Insomnia    Melanoma (HCC) 2010   facial--multiple excision with plastic surgery    Staph infection 2018   Started right hip    Assessment/Plan: 1 Day Post-Op Procedure(s) (LRB): ARTHROPLASTY, KNEE, TOTAL (Right) Principal Problem:   S/P total knee arthroplasty, right  Estimated body mass index is 24.94  kg/m as calculated from the following:   Height as of this encounter: 6\' 1"  (1.854 m).   Weight as of this encounter: 85.7 kg. Advance diet Up with therapy D/C IV fluids   Patient's anticipated LOS is less than 2 midnights, meeting these requirements: - Younger than 79 - Lives within 1 hour of care - Has a competent adult at home to recover with post-op recover - NO history of  - Chronic pain requiring opiods  - Diabetes  - Coronary Artery Disease  - Heart failure  - Heart attack  - Stroke  - DVT/VTE  - Cardiac arrhythmia  - Respiratory Failure/COPD  - Renal failure  - Anemia  - Advanced Liver disease     DVT Prophylaxis - Aspirin  Weight bearing as tolerated.  Hgb stable at 11.9 this AM.  Will send home on abx as he has multiple risk factors for wound infection / delayed healing --- heavy smoker, arterial disease, hx of right hip PJI  Plan is to go Home after hospital stay. Plan for discharge today following 1-2 sessions of PT as long as they are meeting their goals. Patient is scheduled for OPPT. Follow up in the office in 2 weeks.   Kim Pen, PA-C Orthopedic Surgery [phone] 07/04/2023, 7:54 AM

## 2023-07-04 NOTE — TOC Transition Note (Signed)
 Transition of Care [facility]) - Discharge Note   Patient Details  Name: Jon Choi MRN: [id number] Date of Birth: [birth year]  [facility] CM/SW Contact:  Bari Leys, RN Phone Number: 07/04/2023, 10:30 AM   Clinical Narrative:   Met with patient at bedside to review dc therapy and home equipment needs, pt conformed OPPT, has RW at home, no home DME needs. No TOC needs.     Final next level of care: OP Rehab     Patient Goals and CMS Choice Patient states their goals for this hospitalization and ongoing recovery are:: return home          Discharge Placement                       Discharge Plan and Services Additional resources added to the After Visit Summary for                                       Social Drivers of Health (SDOH) Interventions SDOH Screenings   Food Insecurity: No Food Insecurity (07/03/2023)  Housing: Low Risk  (07/03/2023)  Transportation Needs: No Transportation Needs (07/03/2023)  Utilities: Not At Risk (07/03/2023)  Depression (PHQ2-9): Low Risk  (04/16/2018)  Tobacco Use: Medium Risk (07/03/2023)     Readmission Risk Interventions     No data to display

## 2023-07-04 NOTE — Evaluation (Signed)
 Physical Therapy Evaluation Patient Details Name: Jon Choi MRN: [id number] DOB: 1950-04-18 Today's Date: 07/04/2023  History of Present Illness  73 y.o. male admitted 07/03/23 for R TKA. PMH: L4-5 PLIF, HTN, R THA, melanoma, OA.  Clinical Impression  Pt is mobilizing well, he ambulated 180' with RW, no loss of balance. Stair training completed. Pt demonstrates good understanding of HEP. He is ready to DC home from a PT standpoint.         If plan is discharge home, recommend the following: A lot of help with bathing/dressing/bathroom;Assistance with cooking/housework;Assist for transportation;Help with stairs or ramp for entrance   Can travel by private vehicle        Equipment Recommendations None recommended by PT  Recommendations for Other Services       Functional Status Assessment Patient has had a recent decline in their functional status and demonstrates the ability to make significant improvements in function in a reasonable and predictable amount of time.     Precautions / Restrictions Precautions Precautions: Fall;Knee Precaution Booklet Issued: Yes (comment) Recall of Precautions/Restrictions: Intact Precaution/Restrictions Comments: reviewed no pillow under knee Restrictions Weight Bearing Restrictions Per Provider Order: No Other Position/Activity Restrictions: WBAT      Mobility  Bed Mobility Overal bed mobility: Modified Independent             General bed mobility comments: HOB up, used rail    Transfers Overall transfer level: Needs assistance Equipment used: Rolling walker (2 wheels) Transfers: Sit to/from Stand Sit to Stand: Supervision           General transfer comment: VCs hand placement    Ambulation/Gait Ambulation/Gait assistance: Supervision, Modified independent (Device/Increase time) Gait Distance (Feet): 180 Feet Assistive device: Rolling walker (2 wheels) Gait Pattern/deviations: Step-through pattern, Decreased  stride length Gait velocity: WFL     General Gait Details: steady, no loss of balance  Stairs Stairs: Yes Stairs assistance: Supervision Stair Management: One rail Right, Forwards, Step to pattern, With cane Number of Stairs: 3 General stair comments: VCs sequencing  Wheelchair Mobility     Tilt Bed    Modified Rankin (Stroke Patients Only)       Balance Overall balance assessment: Modified Independent                                           Pertinent Vitals/Pain Pain Assessment Pain Assessment: 0-10 Pain Score: 2  Pain Location: R knee Pain Descriptors / Indicators: Sore Pain Intervention(s): Limited activity within patient's tolerance, Monitored during session, Premedicated before session, Ice applied    Home Living Family/patient expects to be discharged to:: Private residence Living Arrangements: Spouse/significant other Available Help at Discharge: Family;Available 24 hours/day Type of Home: House Home Access: Stairs to enter Entrance Stairs-Rails: Right Entrance Stairs-Number of Steps: 3   Home Layout: One level Home Equipment: Agricultural consultant (2 wheels);BSC/3in1;Toilet riser;Cane - single point      Prior Function Prior Level of Function : Independent/Modified Independent             Mobility Comments: walked with [facility] for longer distances ADLs Comments: independent     Extremity/Trunk Assessment   Upper Extremity Assessment Upper Extremity Assessment: Overall WFL for tasks assessed    Lower Extremity Assessment Lower Extremity Assessment: RLE deficits/detail RLE Deficits / Details: SLR 3/5, knee AAROM ~5-65* RLE Sensation: WNL RLE Coordination: WNL    Cervical /  Trunk Assessment Cervical / Trunk Assessment: Normal  Communication   Communication Communication: No apparent difficulties    Cognition Arousal: Alert Behavior During Therapy: WFL for tasks assessed/performed   PT - Cognitive impairments: No apparent  impairments                         Following commands: Intact       Cueing Cueing Techniques: Verbal cues     General Comments      Exercises Total Joint Exercises Ankle Circles/Pumps: AROM, Both, 10 reps, Supine Quad Sets: AROM, Both, 5 reps, Supine Short Arc Quad: AROM, Right, 5 reps, Supine Heel Slides: AAROM, Right, 5 reps, Supine Hip ABduction/ADduction: AROM, Right, 5 reps, Supine Straight Leg Raises: AROM, Right, 5 reps, Supine Long Arc Quad: AROM, Right, 5 reps, Seated Knee Flexion: AAROM, Right, 10 reps, Seated Goniometric ROM: ~5-65* AAROM R knee   Assessment/Plan    PT Assessment Patient does not need any further PT services  PT Problem List         PT Treatment Interventions      PT Goals (Current goals can be found in the Care Plan section)  Acute Rehab PT Goals PT Goal Formulation: All assessment and education complete, DC therapy    Frequency       Co-evaluation               AM-PAC PT "6 Clicks" Mobility  Outcome Measure Help needed turning from your back to your side while in a flat bed without using bedrails?: None Help needed moving from lying on your back to sitting on the side of a flat bed without using bedrails?: None Help needed moving to and from a bed to a chair (including a wheelchair)?: None Help needed standing up from a chair using your arms (e.g., wheelchair or bedside chair)?: None Help needed to walk in hospital room?: None Help needed climbing 3-5 steps with a railing? : A Little 6 Click Score: 23    End of Session Equipment Utilized During Treatment: Gait belt Activity Tolerance: Patient tolerated treatment well Patient left: in bed;with call bell/phone within reach;with family/visitor present Nurse Communication: Mobility status      Time: 1610-9604 PT Time Calculation (min) (ACUTE ONLY): 26 min   Charges:   PT Evaluation $PT Eval Moderate Complexity: 1 Mod PT Treatments $Gait Training: 8-22  mins PT General Charges $$ ACUTE PT VISIT: 1 Visit         Jon Choi PT 07/04/2023  Acute Rehabilitation Services  Office [phone]

## 2023-07-04 NOTE — Progress Notes (Signed)
 Discharge instructions given to patient and wife questions asked and answered. Sylvia Everts  RN

## 2023-07-08 NOTE — Discharge Summary (Signed)
 Patient ID: Jon Choi MRN: [id number] DOB/AGE: 71-Mar-1952 71 y.o.  Admit date: 07/03/2023 Discharge date: 07/04/2023  Admission Diagnoses:  Right knee osteoarthritis  Discharge Diagnoses:  Principal Problem:   S/P total knee arthroplasty, right   Past Medical History:  Diagnosis Date   Arthritis    Back pain    History of kidney stones    Hypertension    Insomnia    Melanoma (HCC) 2010   facial--multiple excision with plastic surgery    Staph infection 2018   Started right hip    Surgeries: Procedure(s): ARTHROPLASTY, KNEE, TOTAL on 07/03/2023   Consultants:   Discharged Condition: Improved  Hospital Course: Jon Choi is an 73 y.o. male who was admitted 07/03/2023 for operative treatment ofS/P total knee arthroplasty, right. Patient has severe unremitting pain that affects sleep, daily activities, and work/hobbies. After pre-op clearance the patient was taken to the operating room on 07/03/2023 and underwent  Procedure(s): ARTHROPLASTY, KNEE, TOTAL.    Patient was given perioperative antibiotics:  Anti-infectives (From admission, onward)    Start     Dose/Rate Route Frequency Ordered Stop   07/04/23 0000  cefadroxil (DURICEF) 500 MG capsule        500 mg Oral 2 times daily 07/04/23 0756 07/11/23 2359   07/03/23 1800  ceFAZolin  (ANCEF ) IVPB 2g/100 mL premix        2 g 200 mL/hr over 30 Minutes Intravenous Every 6 hours 07/03/23 1541 07/04/23 1017   07/03/23 1045  ceFAZolin  (ANCEF ) IVPB 2g/100 mL premix        2 g 200 mL/hr over 30 Minutes Intravenous On call to O.R. 07/03/23 1043 07/03/23 1226        Patient was given sequential compression devices, early ambulation, and chemoprophylaxis to prevent DVT. Patient worked with PT and was meeting their goals regarding safe ambulation and transfers.  Patient benefited maximally from hospital stay and there were no complications.    Recent vital signs: No data found.   Recent laboratory studies: No results  for input(s): "WBC", "HGB", "HCT", "PLT", "NA", "K", "CL", "CO2", "BUN", "CREATININE", "GLUCOSE", "INR", "CALCIUM " in the last 72 hours.  Invalid input(s): "PT", "2"   Discharge Medications:   Allergies as of 07/04/2023   No Known Allergies      Medication List     STOP taking these medications    aspirin  EC 81 MG tablet Replaced by: aspirin  81 MG chewable tablet   diclofenac 75 MG EC tablet Commonly known as: VOLTAREN   naproxen sodium 220 MG tablet Commonly known as: ALEVE       TAKE these medications    aspirin  81 MG chewable tablet Chew 1 tablet (81 mg total) by mouth 2 (two) times daily for 28 days. Replaces: aspirin  EC 81 MG tablet   cefadroxil 500 MG capsule Commonly known as: DURICEF Take 1 capsule (500 mg total) by mouth 2 (two) times daily for 7 days.   celecoxib  200 MG capsule Commonly known as: CELEBREX  Take 200 mg by mouth every evening.   chlorhexidine  4 % external liquid Commonly known as: HIBICLENS  Apply 15 mLs (1 Application total) topically as directed for 30 doses. Use as directed daily for 5 days every other week for 6 weeks.   methocarbamol  500 MG tablet Commonly known as: ROBAXIN  Take 1 tablet (500 mg total) by mouth every 6 (six) hours as needed for muscle spasms.   metoprolol  succinate 25 MG 24 hr tablet Commonly known as: TOPROL -XL Take 50 mg  by mouth every evening.   mupirocin  ointment 2 % Commonly known as: BACTROBAN  Place 1 Application into the nose 2 (two) times daily for 60 doses. Use as directed 2 times daily for 5 days every other week for 6 weeks.   oxyCODONE  5 MG immediate release tablet Commonly known as: Oxy IR/ROXICODONE  Take 1 tablet (5 mg total) by mouth every 4 (four) hours as needed for severe pain (pain score 7-10).   polyethylene glycol 17 g packet Commonly known as: MIRALAX  / GLYCOLAX  Take 17 g by mouth 2 (two) times daily.   senna 8.6 MG Tabs tablet Commonly known as: SENOKOT Take 2 tablets (17.2 mg total)  by mouth at bedtime for 14 days.   sildenafil 100 MG tablet Commonly known as: VIAGRA 100 mg as needed.   simvastatin  20 MG tablet Commonly known as: ZOCOR  Take 20 mg by mouth every evening.   temazepam  30 MG capsule Commonly known as: RESTORIL  Take 30 mg by mouth at bedtime.               Discharge Care Instructions  (From admission, onward)           Start     Ordered   07/04/23 0000  Change dressing       Comments: Maintain surgical dressing until follow up in the clinic. If the edges start to pull up, may reinforce with tape. If the dressing is no longer working, may remove and cover with gauze and tape, but must keep the area dry and clean.  Call with any questions or concerns.   07/04/23 0756            Diagnostic Studies: DG Chest Portable 1 View Result Date: 06/22/2023 CLINICAL DATA:  Syncope. EXAM: PORTABLE CHEST 1 VIEW COMPARISON:  09/08/2016 FINDINGS: No lung volumes are low. The heart is normal in size. Stable mediastinal contours. There is eventration of right hemidiaphragm. Dependent atelectasis at the left lung base. No confluent consolidation. No pulmonary edema, large pleural effusion or pneumothorax. On limited assessment, no acute osseous findings. IMPRESSION: Low lung volumes with dependent atelectasis at the left lung base. Electronically Signed   By: Chadwick Colonel M.D.   On: 06/22/2023 17:25    Disposition: Discharge disposition: 01-Home or Self Care       Discharge Instructions     Call MD / Call 911   Complete by: As directed    If you experience chest pain or shortness of breath, CALL 911 and be transported to the hospital emergency room.  If you develope a fever above 101 F, pus (white drainage) or increased drainage or redness at the wound, or calf pain, call your surgeon's office.   Change dressing   Complete by: As directed    Maintain surgical dressing until follow up in the clinic. If the edges start to pull up, may reinforce  with tape. If the dressing is no longer working, may remove and cover with gauze and tape, but must keep the area dry and clean.  Call with any questions or concerns.   Constipation Prevention   Complete by: As directed    Drink plenty of fluids.  Prune juice may be helpful.  You may use a stool softener, such as Colace (over the counter) 100 mg twice a day.  Use MiraLax  (over the counter) for constipation as needed.   Diet - low sodium heart healthy   Complete by: As directed    Increase activity slowly as tolerated  Complete by: As directed    Weight bearing as tolerated with assist device (walker, cane, etc) as directed, use it as long as suggested by your surgeon or therapist, typically at least 4-6 weeks.   Post-operative opioid taper instructions:   Complete by: As directed    POST-OPERATIVE OPIOID TAPER INSTRUCTIONS: It is important to wean off of your opioid medication as soon as possible. If you do not need pain medication after your surgery it is ok to stop day one. Opioids include: Codeine, Hydrocodone (Norco, Vicodin), Oxycodone (Percocet, oxycontin ) and hydromorphone  amongst others.  Long term and even short term use of opiods can cause: Increased pain response Dependence Constipation Depression Respiratory depression And more.  Withdrawal symptoms can include Flu like symptoms Nausea, vomiting And more Techniques to manage these symptoms Hydrate well Eat regular healthy meals Stay active Use relaxation techniques(deep breathing, meditating, yoga) Do Not substitute Alcohol to help with tapering If you have been on opioids for less than two weeks and do not have pain than it is ok to stop all together.  Plan to wean off of opioids This plan should start within one week post op of your joint replacement. Maintain the same interval or time between taking each dose and first decrease the dose.  Cut the total daily intake of opioids by one tablet each day Next start to  increase the time between doses. The last dose that should be eliminated is the evening dose.      TED hose   Complete by: As directed    Use stockings (TED hose) for 2 weeks on both leg(s).  You may remove them at night for sleeping.        Follow-up Information     Claiborne Crew, MD. Schedule an appointment as soon as possible for a visit in 2 week(s).   Specialty: Orthopedic Surgery Contact information: [address] [place] [address] [place] [address] [phone]                  Signed: Earnie Gola 07/08/2023, 11:10 AM

## 2023-07-17 DIAGNOSIS — M25561 Pain in right knee: Secondary | ICD-10-CM | POA: Diagnosis not present

## 2023-07-18 DIAGNOSIS — Z471 Aftercare following joint replacement surgery: Secondary | ICD-10-CM | POA: Diagnosis not present

## 2023-07-18 DIAGNOSIS — Z96651 Presence of right artificial knee joint: Secondary | ICD-10-CM | POA: Diagnosis not present

## 2023-07-25 DIAGNOSIS — M25561 Pain in right knee: Secondary | ICD-10-CM | POA: Diagnosis not present

## 2023-07-31 DIAGNOSIS — M25561 Pain in right knee: Secondary | ICD-10-CM | POA: Diagnosis not present

## 2023-08-02 DIAGNOSIS — M25561 Pain in right knee: Secondary | ICD-10-CM | POA: Diagnosis not present

## 2023-08-06 DIAGNOSIS — M25561 Pain in right knee: Secondary | ICD-10-CM | POA: Diagnosis not present

## 2023-08-09 DIAGNOSIS — M25561 Pain in right knee: Secondary | ICD-10-CM | POA: Diagnosis not present
